# Patient Record
Sex: Male | Born: 1945 | Race: White | Hispanic: No | Marital: Married | Smoking: Never smoker
Health system: Southern US, Community
[De-identification: ages and names within clinical notes are randomized; demographics above are authoritative.]

## PROBLEM LIST (undated history)

## (undated) DIAGNOSIS — J45909 Unspecified asthma, uncomplicated: Secondary | ICD-10-CM

## (undated) DIAGNOSIS — I10 Essential (primary) hypertension: Secondary | ICD-10-CM

## (undated) DIAGNOSIS — G43909 Migraine, unspecified, not intractable, without status migrainosus: Secondary | ICD-10-CM

## (undated) DIAGNOSIS — R413 Other amnesia: Secondary | ICD-10-CM

## (undated) DIAGNOSIS — E785 Hyperlipidemia, unspecified: Secondary | ICD-10-CM

## (undated) DIAGNOSIS — G8929 Other chronic pain: Secondary | ICD-10-CM

## (undated) DIAGNOSIS — N183 Chronic kidney disease, stage 3 unspecified: Secondary | ICD-10-CM

## (undated) DIAGNOSIS — M545 Low back pain, unspecified: Secondary | ICD-10-CM

## (undated) DIAGNOSIS — H16139 Photokeratitis, unspecified eye: Secondary | ICD-10-CM

## (undated) DIAGNOSIS — F419 Anxiety disorder, unspecified: Secondary | ICD-10-CM

## (undated) DIAGNOSIS — F1010 Alcohol abuse, uncomplicated: Secondary | ICD-10-CM

## (undated) DIAGNOSIS — K219 Gastro-esophageal reflux disease without esophagitis: Secondary | ICD-10-CM

## (undated) DIAGNOSIS — T8859XA Other complications of anesthesia, initial encounter: Secondary | ICD-10-CM

## (undated) DIAGNOSIS — R339 Retention of urine, unspecified: Secondary | ICD-10-CM

## (undated) DIAGNOSIS — T4145XA Adverse effect of unspecified anesthetic, initial encounter: Secondary | ICD-10-CM

## (undated) DIAGNOSIS — Z889 Allergy status to unspecified drugs, medicaments and biological substances status: Secondary | ICD-10-CM

## (undated) DIAGNOSIS — R7303 Prediabetes: Secondary | ICD-10-CM

## (undated) DIAGNOSIS — F32A Depression, unspecified: Secondary | ICD-10-CM

## (undated) DIAGNOSIS — F329 Major depressive disorder, single episode, unspecified: Secondary | ICD-10-CM

## (undated) DIAGNOSIS — G4730 Sleep apnea, unspecified: Secondary | ICD-10-CM

## (undated) DIAGNOSIS — N200 Calculus of kidney: Secondary | ICD-10-CM

## (undated) DIAGNOSIS — M1990 Unspecified osteoarthritis, unspecified site: Secondary | ICD-10-CM

## (undated) HISTORY — DX: Migraine, unspecified, not intractable, without status migrainosus: G43.909

## (undated) HISTORY — DX: Chronic kidney disease, stage 3 unspecified: N18.30

## (undated) HISTORY — PX: VASECTOMY: SHX75

## (undated) HISTORY — PX: CARPAL TUNNEL RELEASE: SHX101

## (undated) HISTORY — PX: NASAL SINUS SURGERY: SHX719

## (undated) HISTORY — DX: Major depressive disorder, single episode, unspecified: F32.9

## (undated) HISTORY — DX: Photokeratitis, unspecified eye: H16.139

## (undated) HISTORY — DX: Other amnesia: R41.3

## (undated) HISTORY — DX: Gastro-esophageal reflux disease without esophagitis: K21.9

## (undated) HISTORY — DX: Anxiety disorder, unspecified: F41.9

## (undated) HISTORY — DX: Depression, unspecified: F32.A

## (undated) HISTORY — DX: Essential (primary) hypertension: I10

## (undated) HISTORY — PX: COLON SURGERY: SHX602

## (undated) HISTORY — DX: Hyperlipidemia, unspecified: E78.5

---

## 1981-04-03 HISTORY — PX: EXCISIONAL HEMORRHOIDECTOMY: SHX1541

## 2003-07-10 ENCOUNTER — Ambulatory Visit (HOSPITAL_BASED_OUTPATIENT_CLINIC_OR_DEPARTMENT_OTHER): Admission: RE | Admit: 2003-07-10 | Discharge: 2003-07-10 | Payer: Self-pay | Admitting: Family Medicine

## 2004-03-22 ENCOUNTER — Ambulatory Visit: Payer: Self-pay | Admitting: Family Medicine

## 2004-09-06 ENCOUNTER — Ambulatory Visit: Payer: Self-pay | Admitting: Family Medicine

## 2005-01-13 ENCOUNTER — Ambulatory Visit: Payer: Self-pay | Admitting: Family Medicine

## 2005-02-17 ENCOUNTER — Ambulatory Visit: Payer: Self-pay | Admitting: Family Medicine

## 2005-04-12 ENCOUNTER — Ambulatory Visit: Payer: Self-pay | Admitting: Family Medicine

## 2012-09-05 ENCOUNTER — Encounter (INDEPENDENT_AMBULATORY_CARE_PROVIDER_SITE_OTHER): Payer: Self-pay

## 2012-09-06 ENCOUNTER — Telehealth (INDEPENDENT_AMBULATORY_CARE_PROVIDER_SITE_OTHER): Payer: Self-pay | Admitting: General Surgery

## 2012-09-06 ENCOUNTER — Encounter (INDEPENDENT_AMBULATORY_CARE_PROVIDER_SITE_OTHER): Payer: Self-pay | Admitting: General Surgery

## 2012-09-06 ENCOUNTER — Ambulatory Visit (INDEPENDENT_AMBULATORY_CARE_PROVIDER_SITE_OTHER): Payer: Medicare PPO | Admitting: General Surgery

## 2012-09-06 VITALS — BP 132/90 | HR 78 | Temp 97.2°F | Resp 16 | Ht 68.0 in | Wt 198.4 lb

## 2012-09-06 DIAGNOSIS — D128 Benign neoplasm of rectum: Secondary | ICD-10-CM

## 2012-09-06 NOTE — Progress Notes (Signed)
Patient ID: Corey Adams., male   DOB: 03/15/1946, 67 y.o.   MRN: [id number]  [name]  September 20, 1945 [id number]  Patient Care Team: Leanor Rubenstein, MD as PCP - General (Family Medicine)  This patient is a 67 y.o.male who presents today for surgical evaluation at the request of Dr. Carolynne Edouard.   Reason for visit: Proximal 5cm large rectal polyp.  Consideration of TEM partial proctectomy vs. Low anterior resection  Pleasant active male.  Found to have large polyp at rectosigmoid junction by his gastroenterologist.  Area was tattooed.  Sent to [place] for evaluation.  Dr. Carolynne Edouard wondered if the patient will be a candidate for TEM so there is no proof of cancer.  Internal second opinion requested  There are no active problems to display for this patient.   Past Medical History  Diagnosis Date  . Hypertension   . Hyperlipidemia   . Allergy   . GERD (gastroesophageal reflux disease)   . Depression   . Anxiety   . Migraine   . Asthma   . Actinic keratitis   . Bilateral carpal tunnel syndrome     Past Surgical History  Procedure Laterality Date  . Excisional hemorrhoidectomy    . Bilateral carp tunnel release    . Nasal sinus surgery      History   Social History  . Marital Status: Married    Spouse Name: N/A    Number of Children: N/A  . Years of Education: N/A   Occupational History  . Not on file.   Social History Main Topics  . Smoking status: Never Smoker   . Smokeless tobacco: Never Used  . Alcohol Use: Yes  . Drug Use: No  . Sexually Active: Not on file   Other Topics Concern  . Not on file   Social History Narrative  . No narrative on file    Family History  Problem Relation Age of Onset  . Heart attack Mother   . Hypercholesterolemia Sister   . Heart disease Father   . Heart attack Father     Current Outpatient Prescriptions  Medication Sig Dispense Refill  . atorvastatin (LIPITOR) 10 MG tablet Take 10 mg by mouth daily.      .  bisoprolol-hydrochlorothiazide (ZIAC) 2.5-6.25 MG per tablet Take 1 tablet by mouth daily.      Marland Kitchen escitalopram (LEXAPRO) 10 MG tablet Take 10 mg by mouth daily.      Marland Kitchen levocetirizine (XYZAL) 5 MG tablet Take 5 mg by mouth every evening.      . mometasone (NASONEX) 50 MCG/ACT nasal spray Place 2 sprays into the nose daily.      Marland Kitchen omeprazole (PRILOSEC) 20 MG capsule Take 20 mg by mouth daily.      . polyethylene glycol-electrolytes (NULYTELY/GOLYTELY) 420 G solution        No current facility-administered medications for this visit.     Allergies  Allergen Reactions  . Codeine   . Shellfish Allergy     BP 132/90  Pulse 78  Temp(Src) 97.2 F (36.2 C) (Temporal)  Resp 16  Ht 5\' 8"  (1.727 m)  Wt 198 lb 6.4 oz (89.994 kg)  BMI 30.17 kg/m2  No results found.  ROS: Constitutional:  No fevers, chills, sweats.  Weight stable Eyes:  No vision changes, No discharge HENT:  No sore throats, nasal drainage Lymph: No neck swelling, No bruising easily Pulmonary:  No cough, productive sputum CV: No orthopnea, PND  Patient walks  30 minutes for about 1 miles without difficulty.  No exertional chest/neck/shoulder/arm pain. GI:  No personal nor family history of GI/colon cancer, inflammatory bowel disease, irritable bowel syndrome, allergy such as Celiac Sprue, dietary/dairy problems, colitis, ulcers nor gastritis.  No recent sick contacts/gastroenteritis.  No travel outside the country.  No changes in diet. Renal: No UTIs, No hematuria Genital:  No drainage, bleeding, masses Musculoskeletal: No severe joint pain.  Good ROM major joints Skin:  No sores or lesions Heme/Lymph:  No easy bleeding.  No swollen lymph nodes  PE: General: Pt awake/alert/oriented x4 in no major acute distress Eyes: PERRL, normal EOM. Sclera nonicteric Neuro: CN II-XII intact w/o focal sensory/motor deficits. Lymph: No head/neck/groin lymphadenopathy Psych:  No delerium/psychosis/paranoia HENT: Normocephalic, Mucus  membranes moist.  No thrush Neck: Supple, No tracheal deviation Chest: No pain.  Good respiratory excursion. CV:  Pulses intact.  Regular rhythm MS: Normal AROM mjr joints.  No obvious deformity Abdomen: Soft, Nondistended.  Nontender.  No incarcerated hernias. Rectal  Exam done with assistance of male Medical Assistant in the room.  Perianal skin clean with good hygiene.  No pruritis.  No external skin tags / hemorrhoids of significance.  No pilonidal disease.  No fissure.  No abscess/fistula.    Tolerates digital and anoscopic rectal exam.  Normal sphincter tone.  Posterior midline polyp in anal canal 2cm from verge - pedunculated.  No other rectal masses.  Hemorrhoidal piles WNL  Ext:  SCDs BLE.  No significant edema.  No cyanosis Skin: No petechiae / purpura  Procedure: Rigid proctoscopy.  After informed was confirmed, the patient was placed in left lateral decubitus positioning.  Was able to do gentle rectal examination digitally.  Cannot palpate a lesion.  Rigid proctoscope was advanced.  Gentle insufflation done.  I did have to remove the scope a few times to remove some stool since he had not been prepped for him but otherwise could see rather well.  Was able to advance her to proctoscope.  At 13 cm, I saw the most distal aspect of the pulp, posterior/left lateral.  There was tattooing distal to that.  I evacuated and he does have a posterior midline polyp within 1/2 cm from anal verge.  Seemed like a small anal canal polyp very pedunculated.  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  Plan: This point, I think it is too proximal to do by TEM.  If it is 5 cm and starts at 13 cm, them he states and it seemed centimeters up in there.  I am a little wary of offering that knowing that peritoneal breech is high.  I think given its proximal location & size, it would be best managed by low anterior resection.  They already have a relationship  with Dr. Carolynne Edouard.  He can handle this well.  They will discuss with him further.

## 2012-09-06 NOTE — Telephone Encounter (Signed)
LMOM for patient to call back and ask for me. To make him aware I have scheduled his MR for 09/10/2012 at 6:00 pm to arrive at 5:45 pm at the [facility] - [address] location. Patient called back and I made him aware of appt.

## 2012-09-06 NOTE — Progress Notes (Signed)
Subjective:     Patient ID: Corey Forester., male   DOB: 11-Apr-1945, 67 y.o.   MRN: [id number]  HPI We're asked to see the patient in consultation by Dr. Evette Cristal to evaluate him for a large polyp in his rectosigmoid area of his colon. The patient is a 67 year old white male who has been in his usual good state of health. He recently went for a routine screening colonoscopy at which time a 5 cm polyp was found at his rectosigmoid area. This was biopsied and came back as tubular adenoma with no dysplasia or malignancy. He denies any problems with constipation or diarrhea. He has not noticed any blood in his stool. His bowels move regularly. He has had no unintentional weight loss.  Review of Systems  Constitutional: Negative.   HENT: Negative.   Eyes: Negative.   Respiratory: Negative.   Cardiovascular: Negative.   Gastrointestinal: Negative.   Endocrine: Negative.   Genitourinary: Negative.   Musculoskeletal: Negative.   Skin: Negative.   Allergic/Immunologic: Negative.   Neurological: Negative.   Hematological: Negative.   Psychiatric/Behavioral: Negative.        Objective:   Physical Exam  Constitutional: He is oriented to person, place, and time. He appears well-developed and well-nourished.  HENT:  Head: Normocephalic and atraumatic.  Eyes: Conjunctivae and EOM are normal. Pupils are equal, round, and reactive to light.  Neck: Normal range of motion. Neck supple.  Cardiovascular: Normal rate, regular rhythm and normal heart sounds.   Pulmonary/Chest: Effort normal and breath sounds normal.  Abdominal: Soft. Bowel sounds are normal. He exhibits no mass. There is no tenderness.  Genitourinary: Rectum normal.  There is no palpable mass on digital exam  Musculoskeletal: Normal range of motion.  There is no palpable groin adenopathy or supraclavicular adenopathy  Neurological: He is alert and oriented to person, place, and time.  Skin: Skin is warm and dry.  Psychiatric: He has  a normal mood and affect. His behavior is normal.       Assessment:     The patient has a large tubular adenoma at his rectosigmoid. Because of the size of the adenoma and the possibility of it deteriorating into a cancer I think it would be to his benefit to have this removed. I had him also see Dr. Michaell Cowing today to see if he would be a candidate for a transanal approach but he is not. At this point I would recommend a laparoscopic assisted low anterior resection. I have discussed with him in detail the risks and benefits of the operation and dizziness as well as some of the technical aspects and he understands and wishes to proceed. Prior to scheduling surgery we will also obtain an MRI of his abdomen since he has a contrast allergy and we will also get a CEA level with his preoperative lab work.     Plan:     Plan for laparoscopic assisted low anterior resection for a large tubular adenoma at the rectosigmoid

## 2012-09-09 ENCOUNTER — Encounter (INDEPENDENT_AMBULATORY_CARE_PROVIDER_SITE_OTHER): Payer: Self-pay

## 2012-09-09 ENCOUNTER — Telehealth (INDEPENDENT_AMBULATORY_CARE_PROVIDER_SITE_OTHER): Payer: Self-pay | Admitting: *Deleted

## 2012-09-09 NOTE — Telephone Encounter (Signed)
Corey Adams with [facility] called to get clarification as to what the MRI is for and what they should be looking for.

## 2012-09-09 NOTE — Telephone Encounter (Signed)
Noreene Larsson updated at this time.

## 2012-09-09 NOTE — Telephone Encounter (Signed)
Evaluating abdominal mass. He has contrast allergy

## 2012-09-10 ENCOUNTER — Ambulatory Visit
Admission: RE | Admit: 2012-09-10 | Discharge: 2012-09-10 | Disposition: A | Discharge status: Home / Self Care | Payer: Medicare PPO | Source: Ambulatory Visit | Attending: General Surgery | Admitting: General Surgery

## 2012-09-10 DIAGNOSIS — D128 Benign neoplasm of rectum: Secondary | ICD-10-CM

## 2012-09-10 MED ORDER — GADOBENATE DIMEGLUMINE 529 MG/ML IV SOLN
20.0000 mL | Freq: Once | INTRAVENOUS | Status: AC | PRN
  Administered 2012-09-10: 20 mL via INTRAVENOUS

## 2012-09-16 NOTE — Telephone Encounter (Signed)
Notified patient that MRI did not show any additional disease.  Dr. Carolynne Edouard will proceed with orders and surgery scheduling should contact him early next week.

## 2012-09-23 ENCOUNTER — Encounter (INDEPENDENT_AMBULATORY_CARE_PROVIDER_SITE_OTHER): Payer: Self-pay | Admitting: General Surgery

## 2012-10-16 ENCOUNTER — Encounter (HOSPITAL_COMMUNITY): Payer: Self-pay | Admitting: Pharmacy Technician

## 2012-10-18 NOTE — Pre-Procedure Instructions (Addendum)
Warnell Forester.  10/18/2012   Your procedure is scheduled on:  Wednesday, July 30th.   Report to [facility] at 6:30AM. Check in at Admitting and wait in lobby,someone will come to get you   Call this number if you have problems the morning of surgery: [phone]   Remember:   Do not eat food or drink liquids after midnight.    Take these medicines the morning of surgery with A SIP OF WATER: bisoprolol-hydrochlorothiazide (ZIAC),escitalopram (LEXAPRO),omeprazole (PRILOSEC OTC).  May use mometasone (NASONEX).  Stop taking Aspirin, Coumadin, Plavix, Effient and Herbal medications.  Do not take any NSAIDs ie: Ibuprofen,  Advil,Naproxen or any medication containing Aspirin.   Do not wear jewelry,  Do not wear lotions, powders, or perfumes.   Do not shave 48 hours prior to surgery. Men may shave face and neck.  Do not bring valuables to the hospital.  [facility] is not responsible  for any belongings or valuables.  Contacts, dentures or bridgework may not be worn into surgery.  Leave suitcase in the car. After surgery it may be brought to your room.  For patients admitted to the hospital, checkout time is 11:00 AM the day of discharge.   Patients discharged the day of surgery will not be allowed to drive home.     Special Instructions: Shower using CHG 2 nights before surgery and the night before surgery.  If you shower the day of surgery use CHG.  Use special wash - you have one bottle of CHG for all showers.  You should use approximately 1/3 of the bottle for each shower.   Please read over the following fact sheets that you were given: Pain Booklet, Coughing and Deep Breathing and Surgical Site Infection Prevention

## 2012-10-21 ENCOUNTER — Encounter (HOSPITAL_COMMUNITY): Payer: Self-pay

## 2012-10-21 ENCOUNTER — Ambulatory Visit (HOSPITAL_COMMUNITY)
Admission: RE | Admit: 2012-10-21 | Discharge: 2012-10-21 | Disposition: A | Discharge status: Home / Self Care | Payer: Medicare PPO | Source: Ambulatory Visit | Attending: Anesthesiology | Admitting: Anesthesiology

## 2012-10-21 ENCOUNTER — Telehealth (INDEPENDENT_AMBULATORY_CARE_PROVIDER_SITE_OTHER): Payer: Self-pay

## 2012-10-21 ENCOUNTER — Encounter (HOSPITAL_COMMUNITY)
Admission: RE | Admit: 2012-10-21 | Discharge: 2012-10-21 | Disposition: A | Discharge status: Home / Self Care | Payer: Medicare PPO | Source: Ambulatory Visit | Attending: General Surgery | Admitting: General Surgery

## 2012-10-21 DIAGNOSIS — I10 Essential (primary) hypertension: Secondary | ICD-10-CM | POA: Insufficient documentation

## 2012-10-21 DIAGNOSIS — Z01810 Encounter for preprocedural cardiovascular examination: Secondary | ICD-10-CM | POA: Insufficient documentation

## 2012-10-21 DIAGNOSIS — J45909 Unspecified asthma, uncomplicated: Secondary | ICD-10-CM | POA: Insufficient documentation

## 2012-10-21 DIAGNOSIS — Z01812 Encounter for preprocedural laboratory examination: Secondary | ICD-10-CM | POA: Insufficient documentation

## 2012-10-21 DIAGNOSIS — K219 Gastro-esophageal reflux disease without esophagitis: Secondary | ICD-10-CM | POA: Insufficient documentation

## 2012-10-21 HISTORY — DX: Adverse effect of unspecified anesthetic, initial encounter: T41.45XA

## 2012-10-21 HISTORY — DX: Unspecified osteoarthritis, unspecified site: M19.90

## 2012-10-21 HISTORY — DX: Sleep apnea, unspecified: G47.30

## 2012-10-21 HISTORY — DX: Other complications of anesthesia, initial encounter: T88.59XA

## 2012-10-21 LAB — CBC
HCT: 44 % (ref 39.0–52.0)
MCV: 91.9 fL (ref 78.0–100.0)
Platelets: 179 10*3/uL (ref 150–400)
RBC: 4.79 MIL/uL (ref 4.22–5.81)
WBC: 5.7 10*3/uL (ref 4.0–10.5)

## 2012-10-21 LAB — BASIC METABOLIC PANEL
CO2: 29 mEq/L (ref 19–32)
Calcium: 9.8 mg/dL (ref 8.4–10.5)
Chloride: 104 mEq/L (ref 96–112)
Sodium: 142 mEq/L (ref 135–145)

## 2012-10-21 MED ORDER — CHLORHEXIDINE GLUCONATE 4 % EX LIQD: 1.0000 "application " | Freq: Once | CUTANEOUS | Status: DC

## 2012-10-21 NOTE — Telephone Encounter (Signed)
Message copied by Brennan Bailey on Mon Oct 21, 2012 11:55 AM ------      Message from: Marin Shutter      Created: Mon Oct 21, 2012 11:35 AM      Regarding: Dr. Alvester Morin: [phone]       Pt called, getting sx next week, would like to know if he needs to take anything to 'flush his colon'. Thx ------

## 2012-10-21 NOTE — Telephone Encounter (Signed)
Called patient and let him know 1 day prep is being mailed to him. He is not being prescribed abx,

## 2012-10-29 MED ORDER — DEXTROSE 5 % IV SOLN
2.0000 g | INTRAVENOUS | Status: AC
  Administered 2012-10-30: 2 g via INTRAVENOUS
  Filled 2012-10-29: qty 2

## 2012-10-30 ENCOUNTER — Inpatient Hospital Stay (HOSPITAL_COMMUNITY)
Admission: RE | Admit: 2012-10-30 | Discharge: 2012-11-03 | DRG: 334 | Disposition: A | Discharge status: Home / Self Care | Payer: Medicare PPO | Source: Ambulatory Visit | Attending: General Surgery | Admitting: General Surgery

## 2012-10-30 ENCOUNTER — Encounter (HOSPITAL_COMMUNITY): Payer: Self-pay | Admitting: Anesthesiology

## 2012-10-30 ENCOUNTER — Encounter (HOSPITAL_COMMUNITY)
Admission: RE | Disposition: A | Discharge status: Home / Self Care | Payer: Self-pay | Source: Ambulatory Visit | Attending: General Surgery

## 2012-10-30 ENCOUNTER — Encounter (HOSPITAL_COMMUNITY): Payer: Self-pay

## 2012-10-30 ENCOUNTER — Inpatient Hospital Stay (HOSPITAL_COMMUNITY): Payer: Medicare PPO | Admitting: Anesthesiology

## 2012-10-30 DIAGNOSIS — D126 Benign neoplasm of colon, unspecified: Secondary | ICD-10-CM

## 2012-10-30 DIAGNOSIS — F411 Generalized anxiety disorder: Secondary | ICD-10-CM | POA: Diagnosis present

## 2012-10-30 DIAGNOSIS — F3289 Other specified depressive episodes: Secondary | ICD-10-CM | POA: Diagnosis present

## 2012-10-30 DIAGNOSIS — E785 Hyperlipidemia, unspecified: Secondary | ICD-10-CM | POA: Diagnosis present

## 2012-10-30 DIAGNOSIS — K620 Anal polyp: Secondary | ICD-10-CM | POA: Diagnosis present

## 2012-10-30 DIAGNOSIS — D128 Benign neoplasm of rectum: Principal | ICD-10-CM | POA: Diagnosis present

## 2012-10-30 DIAGNOSIS — I10 Essential (primary) hypertension: Secondary | ICD-10-CM | POA: Diagnosis present

## 2012-10-30 DIAGNOSIS — K219 Gastro-esophageal reflux disease without esophagitis: Secondary | ICD-10-CM | POA: Diagnosis present

## 2012-10-30 DIAGNOSIS — F329 Major depressive disorder, single episode, unspecified: Secondary | ICD-10-CM | POA: Diagnosis present

## 2012-10-30 DIAGNOSIS — J45909 Unspecified asthma, uncomplicated: Secondary | ICD-10-CM | POA: Diagnosis present

## 2012-10-30 HISTORY — PX: LAPAROSCOPIC LOW ANTERIOR RESECTION: SHX5904

## 2012-10-30 LAB — CBC
HCT: 39.5 % (ref 39.0–52.0)
MCHC: 35.7 g/dL (ref 30.0–36.0)
Platelets: 162 10*3/uL (ref 150–400)
RDW: 12.3 % (ref 11.5–15.5)

## 2012-10-30 LAB — TYPE AND SCREEN: ABO/RH(D): O POS

## 2012-10-30 LAB — CREATININE, SERUM: GFR calc non Af Amer: 75 mL/min — ABNORMAL LOW (ref >90)

## 2012-10-30 SURGERY — RESECTION, RECTUM, LOW ANTERIOR, LAPAROSCOPIC
Anesthesia: General | Site: Abdomen | Wound class: Clean Contaminated

## 2012-10-30 MED ORDER — BIOTENE DRY MOUTH MT LIQD
15.0000 mL | Freq: Two times a day (BID) | OROMUCOSAL | Status: DC
  Administered 2012-10-30 – 2012-11-02 (×5): 15 mL via OROMUCOSAL

## 2012-10-30 MED ORDER — POVIDONE-IODINE 10 % EX OINT
TOPICAL_OINTMENT | CUTANEOUS | Status: AC
  Filled 2012-10-30: qty 28.35

## 2012-10-30 MED ORDER — CHLORHEXIDINE GLUCONATE 4 % EX LIQD: 1.0000 "application " | Freq: Once | CUTANEOUS | Status: DC

## 2012-10-30 MED ORDER — MORPHINE SULFATE (PF) 1 MG/ML IV SOLN
INTRAVENOUS | Status: AC
  Filled 2012-10-30: qty 25

## 2012-10-30 MED ORDER — GLYCOPYRROLATE 0.2 MG/ML IJ SOLN
INTRAMUSCULAR | Status: DC | PRN
  Administered 2012-10-30: 0.6 mg via INTRAVENOUS

## 2012-10-30 MED ORDER — SODIUM CHLORIDE 0.9 % IR SOLN
Status: DC | PRN
  Administered 2012-10-30 (×2): 1000 mL

## 2012-10-30 MED ORDER — DIPHENHYDRAMINE HCL 12.5 MG/5ML PO ELIX
12.5000 mg | ORAL_SOLUTION | Freq: Four times a day (QID) | ORAL | Status: DC | PRN
  Filled 2012-10-30: qty 5

## 2012-10-30 MED ORDER — MORPHINE SULFATE (PF) 1 MG/ML IV SOLN
INTRAVENOUS | Status: DC
  Administered 2012-10-30: 13:00:00 via INTRAVENOUS
  Administered 2012-10-30: 18 mg via INTRAVENOUS
  Administered 2012-10-30: 4 mg via INTRAVENOUS
  Filled 2012-10-30: qty 25

## 2012-10-30 MED ORDER — CHLORHEXIDINE GLUCONATE 0.12 % MT SOLN
15.0000 mL | Freq: Two times a day (BID) | OROMUCOSAL | Status: DC
  Administered 2012-10-31 – 2012-11-02 (×4): 15 mL via OROMUCOSAL
  Filled 2012-10-30 (×5): qty 15

## 2012-10-30 MED ORDER — ONDANSETRON HCL 4 MG/2ML IJ SOLN
4.0000 mg | Freq: Four times a day (QID) | INTRAMUSCULAR | Status: DC | PRN
  Administered 2012-10-31 (×2): 4 mg via INTRAVENOUS
  Filled 2012-10-30 (×2): qty 2

## 2012-10-30 MED ORDER — ALVIMOPAN 12 MG PO CAPS
12.0000 mg | ORAL_CAPSULE | Freq: Once | ORAL | Status: AC
  Administered 2012-10-30: 12 mg via ORAL
  Filled 2012-10-30: qty 1

## 2012-10-30 MED ORDER — SODIUM CHLORIDE 0.9 % IJ SOLN: 9.0000 mL | INTRAMUSCULAR | Status: DC | PRN

## 2012-10-30 MED ORDER — ONDANSETRON HCL 4 MG/2ML IJ SOLN
INTRAMUSCULAR | Status: DC | PRN
  Administered 2012-10-30: 4 mg via INTRAVENOUS

## 2012-10-30 MED ORDER — BUPIVACAINE-EPINEPHRINE 0.25% -1:200000 IJ SOLN
INTRAMUSCULAR | Status: DC | PRN
  Administered 2012-10-30: 30 mL

## 2012-10-30 MED ORDER — ALVIMOPAN 12 MG PO CAPS
12.0000 mg | ORAL_CAPSULE | Freq: Two times a day (BID) | ORAL | Status: DC
  Administered 2012-10-31 – 2012-11-01 (×4): 12 mg via ORAL
  Filled 2012-10-30 (×7): qty 1

## 2012-10-30 MED ORDER — EPHEDRINE SULFATE 50 MG/ML IJ SOLN
INTRAMUSCULAR | Status: DC | PRN
  Administered 2012-10-30 (×3): 10 mg via INTRAVENOUS

## 2012-10-30 MED ORDER — ALBUMIN HUMAN 5 % IV SOLN
INTRAVENOUS | Status: DC | PRN
  Administered 2012-10-30: 10:00:00 via INTRAVENOUS

## 2012-10-30 MED ORDER — LACTATED RINGERS IV SOLN
INTRAVENOUS | Status: DC | PRN
  Administered 2012-10-30 (×4): via INTRAVENOUS

## 2012-10-30 MED ORDER — NALOXONE HCL 0.4 MG/ML IJ SOLN
0.4000 mg | INTRAMUSCULAR | Status: DC | PRN
  Filled 2012-10-30: qty 1

## 2012-10-30 MED ORDER — KCL IN DEXTROSE-NACL 20-5-0.9 MEQ/L-%-% IV SOLN
INTRAVENOUS | Status: DC
  Administered 2012-10-30 – 2012-11-02 (×5): via INTRAVENOUS
  Filled 2012-10-30 (×9): qty 1000

## 2012-10-30 MED ORDER — ONDANSETRON HCL 4 MG PO TABS: 4.0000 mg | ORAL_TABLET | Freq: Four times a day (QID) | ORAL | Status: DC | PRN

## 2012-10-30 MED ORDER — LIDOCAINE HCL 1 % IJ SOLN: INTRAMUSCULAR | Status: DC | PRN

## 2012-10-30 MED ORDER — FENTANYL CITRATE 0.05 MG/ML IJ SOLN
INTRAMUSCULAR | Status: DC | PRN
  Administered 2012-10-30: 50 ug via INTRAVENOUS
  Administered 2012-10-30: 100 ug via INTRAVENOUS
  Administered 2012-10-30 (×2): 50 ug via INTRAVENOUS
  Administered 2012-10-30: 150 ug via INTRAVENOUS

## 2012-10-30 MED ORDER — ROCURONIUM BROMIDE 100 MG/10ML IV SOLN
INTRAVENOUS | Status: DC | PRN
  Administered 2012-10-30: 50 mg via INTRAVENOUS

## 2012-10-30 MED ORDER — LIDOCAINE HCL 4 % MT SOLN
OROMUCOSAL | Status: DC | PRN
  Administered 2012-10-30: 4 mL via TOPICAL

## 2012-10-30 MED ORDER — DIPHENHYDRAMINE HCL 50 MG/ML IJ SOLN
12.5000 mg | Freq: Four times a day (QID) | INTRAMUSCULAR | Status: DC | PRN
  Filled 2012-10-30: qty 0.25

## 2012-10-30 MED ORDER — HEPARIN SODIUM (PORCINE) 5000 UNIT/ML IJ SOLN
5000.0000 [IU] | Freq: Three times a day (TID) | INTRAMUSCULAR | Status: DC
  Administered 2012-10-31 – 2012-11-03 (×10): 5000 [IU] via SUBCUTANEOUS
  Filled 2012-10-30 (×16): qty 1

## 2012-10-30 MED ORDER — PANTOPRAZOLE SODIUM 40 MG IV SOLR
40.0000 mg | INTRAVENOUS | Status: DC
  Administered 2012-10-30 – 2012-11-01 (×3): 40 mg via INTRAVENOUS
  Filled 2012-10-30 (×4): qty 40

## 2012-10-30 MED ORDER — FLUTICASONE PROPIONATE 50 MCG/ACT NA SUSP
1.0000 | Freq: Every day | NASAL | Status: DC
  Administered 2012-10-30 – 2012-11-02 (×4): 1 via NASAL
  Filled 2012-10-30: qty 16

## 2012-10-30 MED ORDER — PHENYLEPHRINE HCL 10 MG/ML IJ SOLN
INTRAMUSCULAR | Status: DC | PRN
  Administered 2012-10-30 (×2): 80 ug via INTRAVENOUS
  Administered 2012-10-30: 40 ug via INTRAVENOUS

## 2012-10-30 MED ORDER — PNEUMOCOCCAL VAC POLYVALENT 25 MCG/0.5ML IJ INJ
0.5000 mL | INJECTION | INTRAMUSCULAR | Status: AC
  Administered 2012-10-31: 0.5 mL via INTRAMUSCULAR
  Filled 2012-10-30: qty 0.5

## 2012-10-30 MED ORDER — HYDROMORPHONE HCL PF 1 MG/ML IJ SOLN: 0.2500 mg | INTRAMUSCULAR | Status: DC | PRN

## 2012-10-30 MED ORDER — MORPHINE SULFATE 4 MG/ML IJ SOLN
4.0000 mg | INTRAMUSCULAR | Status: DC | PRN
  Administered 2012-10-31 (×3): 4 mg via INTRAVENOUS
  Filled 2012-10-30 (×3): qty 1

## 2012-10-30 MED ORDER — VECURONIUM BROMIDE 10 MG IV SOLR
INTRAVENOUS | Status: DC | PRN
  Administered 2012-10-30 (×2): 3 mg via INTRAVENOUS
  Administered 2012-10-30: 1 mg via INTRAVENOUS

## 2012-10-30 MED ORDER — ONDANSETRON HCL 4 MG/2ML IJ SOLN: 4.0000 mg | Freq: Four times a day (QID) | INTRAMUSCULAR | Status: DC | PRN

## 2012-10-30 MED ORDER — PROPOFOL 10 MG/ML IV BOLUS
INTRAVENOUS | Status: DC | PRN
  Administered 2012-10-30: 20 mg via INTRAVENOUS
  Administered 2012-10-30: 130 mg via INTRAVENOUS

## 2012-10-30 MED ORDER — WHITE PETROLATUM GEL
Status: AC
  Administered 2012-10-30: 16:00:00
  Filled 2012-10-30: qty 5

## 2012-10-30 MED ORDER — OXYCODONE HCL 5 MG PO TABS
5.0000 mg | ORAL_TABLET | Freq: Once | ORAL | Status: DC | PRN
Start: 2012-10-30 — End: 2012-10-30

## 2012-10-30 MED ORDER — OXYCODONE HCL 5 MG/5ML PO SOLN: 5.0000 mg | Freq: Once | ORAL | Status: DC | PRN

## 2012-10-30 MED ORDER — NEOSTIGMINE METHYLSULFATE 1 MG/ML IJ SOLN
INTRAMUSCULAR | Status: DC | PRN
  Administered 2012-10-30: 5 mg via INTRAVENOUS

## 2012-10-30 MED ORDER — LIDOCAINE HCL (CARDIAC) 20 MG/ML IV SOLN
INTRAVENOUS | Status: DC | PRN
  Administered 2012-10-30: 40 mg via INTRAVENOUS

## 2012-10-30 MED ORDER — MIDAZOLAM HCL 5 MG/5ML IJ SOLN
INTRAMUSCULAR | Status: DC | PRN
  Administered 2012-10-30: 2 mg via INTRAVENOUS

## 2012-10-30 MED ORDER — BUPIVACAINE-EPINEPHRINE PF 0.25-1:200000 % IJ SOLN
INTRAMUSCULAR | Status: AC
  Filled 2012-10-30: qty 30

## 2012-10-30 SURGICAL SUPPLY — 78 items
APPLIER CLIP 5 13 M/L LIGAMAX5 (MISCELLANEOUS)
APPLIER CLIP ROT 10 11.4 M/L (STAPLE)
APR CLP MED LRG 11.4X10 (STAPLE)
BLADE SURG 10 STRL SS (BLADE) ×2 IMPLANT
BLADE SURG ROTATE 9660 (MISCELLANEOUS) IMPLANT
CANISTER SUCTION 2500CC (MISCELLANEOUS) ×2 IMPLANT
CELLS DAT CNTRL 66122 CELL SVR (MISCELLANEOUS) IMPLANT
CHLORAPREP W/TINT 26ML (MISCELLANEOUS) ×2 IMPLANT
CLIP APPLIE 5 13 M/L LIGAMAX5 (MISCELLANEOUS) IMPLANT
CLIP APPLIE ROT 10 11.4 M/L (STAPLE) IMPLANT
CLOTH BEACON ORANGE TIMEOUT ST (SAFETY) ×2 IMPLANT
COVER MAYO STAND STRL (DRAPES) ×2 IMPLANT
COVER SURGICAL LIGHT HANDLE (MISCELLANEOUS) ×2 IMPLANT
DISSECTOR BLUNT TIP ENDO 5MM (MISCELLANEOUS) IMPLANT
DRAPE PROXIMA HALF (DRAPES) ×2 IMPLANT
DRAPE UTILITY 15X26 W/TAPE STR (DRAPE) ×6 IMPLANT
DRAPE WARM FLUID 44X44 (DRAPE) ×2 IMPLANT
DRSG OPSITE POSTOP 4X8 (GAUZE/BANDAGES/DRESSINGS) ×2 IMPLANT
ELECT CAUTERY BLADE 6.4 (BLADE) ×4 IMPLANT
ELECT REM PT RETURN 9FT ADLT (ELECTROSURGICAL) ×2
ELECTRODE REM PT RTRN 9FT ADLT (ELECTROSURGICAL) ×1 IMPLANT
GAUZE SPONGE 2X2 8PLY STRL LF (GAUZE/BANDAGES/DRESSINGS) ×1 IMPLANT
GEL ULTRASOUND 20GR AQUASONIC (MISCELLANEOUS) IMPLANT
GLOVE BIO SURGEON STRL SZ7 (GLOVE) ×4 IMPLANT
GLOVE BIO SURGEON STRL SZ7.5 (GLOVE) ×8 IMPLANT
GLOVE BIOGEL PI IND STRL 7.5 (GLOVE) ×5 IMPLANT
GLOVE BIOGEL PI IND STRL 8 (GLOVE) ×2 IMPLANT
GLOVE BIOGEL PI INDICATOR 7.5 (GLOVE) ×5
GLOVE BIOGEL PI INDICATOR 8 (GLOVE) ×2
GLOVE ECLIPSE 7.5 STRL STRAW (GLOVE) ×4 IMPLANT
GLOVE SS BIOGEL STRL SZ 7.5 (GLOVE) ×2 IMPLANT
GLOVE SUPERSENSE BIOGEL SZ 7.5 (GLOVE) ×2
GOWN STRL NON-REIN LRG LVL3 (GOWN DISPOSABLE) ×10 IMPLANT
KIT BASIN OR (CUSTOM PROCEDURE TRAY) ×2 IMPLANT
KIT ROOM TURNOVER OR (KITS) ×2 IMPLANT
LEGGING LITHOTOMY PAIR STRL (DRAPES) ×2 IMPLANT
LIGASURE IMPACT 36 18CM CVD LR (INSTRUMENTS) ×2 IMPLANT
NS IRRIG 1000ML POUR BTL (IV SOLUTION) ×4 IMPLANT
PAD ARMBOARD 7.5X6 YLW CONV (MISCELLANEOUS) ×4 IMPLANT
PAD SHARPS MAGNETIC DISPOSAL (MISCELLANEOUS) ×2 IMPLANT
PENCIL BUTTON HOLSTER BLD 10FT (ELECTRODE) ×2 IMPLANT
RTRCTR WOUND ALEXIS 18CM MED (MISCELLANEOUS)
SCALPEL HARMONIC ACE (MISCELLANEOUS) ×2 IMPLANT
SCISSORS LAP 5X35 DISP (ENDOMECHANICALS) IMPLANT
SEALER TISSUE G2 STRG ARTC 35C (ENDOMECHANICALS) IMPLANT
SET IRRIG TUBING LAPAROSCOPIC (IRRIGATION / IRRIGATOR) ×2 IMPLANT
SLEEVE ENDOPATH XCEL 5M (ENDOMECHANICALS) ×8 IMPLANT
SPECIMEN JAR LARGE (MISCELLANEOUS) ×2 IMPLANT
SPONGE GAUZE 2X2 STER 10/PKG (GAUZE/BANDAGES/DRESSINGS) ×1
SPONGE LAP 18X18 X RAY DECT (DISPOSABLE) IMPLANT
STAPLER CUT CVD 40MM BLUE (STAPLE) ×2 IMPLANT
STAPLER PROXIMATE 75MM BLUE (STAPLE) ×2 IMPLANT
STAPLER VISISTAT 35W (STAPLE) ×2 IMPLANT
SUCTION POOLE TIP (SUCTIONS) ×2 IMPLANT
SURGILUBE 2OZ TUBE FLIPTOP (MISCELLANEOUS) ×2 IMPLANT
SUT PDS AB 1 CT  36 (SUTURE)
SUT PDS AB 1 CT 36 (SUTURE) IMPLANT
SUT PDS AB 1 TP1 96 (SUTURE) ×4 IMPLANT
SUT PROLENE 2 0 CT2 30 (SUTURE) ×2 IMPLANT
SUT PROLENE 2 0 KS (SUTURE) IMPLANT
SUT VIC AB 2-0 SH 18 (SUTURE) ×2 IMPLANT
SUT VIC AB 3-0 SH 18 (SUTURE) ×2 IMPLANT
SUT VICRYL AB 2 0 TIES (SUTURE) ×2 IMPLANT
SUT VICRYL AB 3 0 TIES (SUTURE) ×2 IMPLANT
SYS LAPSCP GELPORT 120MM (MISCELLANEOUS)
SYSTEM LAPSCP GELPORT 120MM (MISCELLANEOUS) IMPLANT
TAPE CLOTH SURG 6X10 WHT LF (GAUZE/BANDAGES/DRESSINGS) ×2 IMPLANT
TOWEL OR 17X26 10 PK STRL BLUE (TOWEL DISPOSABLE) ×4 IMPLANT
TRAY FOLEY CATH 14FRSI W/METER (CATHETERS) ×2 IMPLANT
TRAY LAPAROSCOPIC (CUSTOM PROCEDURE TRAY) ×2 IMPLANT
TRAY PROCTOSCOPIC FIBER OPTIC (SET/KITS/TRAYS/PACK) ×2 IMPLANT
TROCAR XCEL 12X100 BLDLESS (ENDOMECHANICALS) IMPLANT
TROCAR XCEL BLUNT TIP 100MML (ENDOMECHANICALS) IMPLANT
TROCAR XCEL NON-BLD 11X100MML (ENDOMECHANICALS) IMPLANT
TROCAR XCEL NON-BLD 5MMX100MML (ENDOMECHANICALS) ×2 IMPLANT
TUBE CONNECTING 12X1/4 (SUCTIONS) ×2 IMPLANT
TUBING FILTER THERMOFLATOR (ELECTROSURGICAL) ×2 IMPLANT
YANKAUER SUCT BULB TIP NO VENT (SUCTIONS) ×4 IMPLANT

## 2012-10-30 NOTE — Anesthesia Postprocedure Evaluation (Signed)
  Anesthesia Post-op Note  Patient: Corey Adams.  Procedure(s) Performed: Procedure(s): LAPAROSCOPIC LOW ANTERIOR RESECTION with Rigid Proctoscopy (N/A)  Patient Location: PACU  Anesthesia Type:General  Level of Consciousness: awake and alert   Airway and Oxygen Therapy: Patient Spontanous Breathing and Patient connected to nasal cannula oxygen  Post-op Pain: none  Post-op Assessment: Post-op Vital signs reviewed, Patient's Cardiovascular Status Stable, Respiratory Function Stable, Patent Airway and No signs of Nausea or vomiting  Post-op Vital Signs: Reviewed and stable  Complications: No apparent anesthesia complications

## 2012-10-30 NOTE — Interval H&P Note (Signed)
History and Physical Interval Note:  10/30/2012 7:40 AM  Corey Adams.  has presented today for surgery, with the diagnosis of rectal mass  The various methods of treatment have been discussed with the patient and family. After consideration of risks, benefits and other options for treatment, the patient has consented to  Procedure(s): LAPAROSCOPIC LOW ANTERIOR RESECTION (N/A) as a surgical intervention .  The patient's history has been reviewed, patient examined, no change in status, stable for surgery.  I have reviewed the patient's chart and labs.  Questions were answered to the patient's satisfaction.     TOTH III,Gamal Todisco S

## 2012-10-30 NOTE — H&P (Signed)
Corey Adams.  09/06/2012 10:10 AM   Office Visit  MRN:  [id number]   Description: 67 year old male  Provider: Robyne Askew, MD  Department: [facility]        Diagnoses    Adenomatous rectal polyp    -  Primary    211.4         Current Vitals - Last Recorded    BP Pulse Temp(Src) Resp Ht Wt    132/90 78 97.2 F (36.2 C) (Temporal) 16 5\' 8"  (1.727 m) 198 lb 6.4 oz (89.994 kg)       BMI              30.17 kg/m2                 Progress Notes    Corey Sportsman, MD at 09/06/2012  2:13 PM    Status: Signed                   Patient ID: Corey Adams., male   DOB: 18-Feb-1946, 67 y.o.   MRN: [id number]   Corey Adams   1945-07-26 [id number]   Patient Care Team: Leanor Rubenstein, MD as PCP - General (Family Medicine)   This patient is a 67 y.o.male who presents today for surgical evaluation at the request of Dr. Carolynne Edouard.    Reason for visit: Proximal 5cm large rectal polyp.  Consideration of TEM partial proctectomy vs. Low anterior resection   Pleasant active male.  Found to have large polyp at rectosigmoid junction by his gastroenterologist.  Area was tattooed.  Sent to [place] for evaluation.  Dr. Carolynne Edouard wondered if the patient will be a candidate for TEM so there is no proof of cancer.  Internal second opinion requested   There are no active problems to display for this patient.      Past Medical History   Diagnosis  Date   .  Hypertension     .  Hyperlipidemia     .  Allergy     .  GERD (gastroesophageal reflux disease)     .  Depression     .  Anxiety     .  Migraine     .  Asthma     .  Actinic keratitis     .  Bilateral carpal tunnel syndrome           Past Surgical History   Procedure  Laterality  Date   .  Excisional hemorrhoidectomy       .  Bilateral carp tunnel release       .  Nasal sinus surgery             History       Social History   .  Marital Status:  Married       Spouse Name:  N/A       Number of  Children:  N/A   .  Years of Education:  N/A       Occupational History   .  Not on file.       Social History Main Topics   .  Smoking status:  Never Smoker    .  Smokeless tobacco:  Never Used   .  Alcohol Use:  Yes   .  Drug Use:  No   .  Sexually Active:  Not on file       Other Topics  Concern   .  Not on file       Social History Narrative   .  No narrative on file         Family History   Problem  Relation  Age of Onset   .  Heart attack  Mother     .  Hypercholesterolemia  Sister     .  Heart disease  Father     .  Heart attack  Father           Current Outpatient Prescriptions   Medication  Sig  Dispense  Refill   .  atorvastatin (LIPITOR) 10 MG tablet  Take 10 mg by mouth daily.         .  bisoprolol-hydrochlorothiazide (ZIAC) 2.5-6.25 MG per tablet  Take 1 tablet by mouth daily.         Marland Kitchen  escitalopram (LEXAPRO) 10 MG tablet  Take 10 mg by mouth daily.         Marland Kitchen  levocetirizine (XYZAL) 5 MG tablet  Take 5 mg by mouth every evening.         .  mometasone (NASONEX) 50 MCG/ACT nasal spray  Place 2 sprays into the nose daily.         Marland Kitchen  omeprazole (PRILOSEC) 20 MG capsule  Take 20 mg by mouth daily.         .  polyethylene glycol-electrolytes (NULYTELY/GOLYTELY) 420 G solution               No current facility-administered medications for this visit.         Allergies   Allergen  Reactions   .  Codeine     .  Shellfish Allergy          BP 132/90  Pulse 78  Temp(Src) 97.2 F (36.2 C) (Temporal)  Resp 16  Ht 5\' 8"  (1.727 m)  Wt 198 lb 6.4 oz (89.994 kg)  BMI 30.17 kg/m2   No results found.   ROS: Constitutional:  No fevers, chills, sweats.  Weight stable Eyes:  No vision changes, No discharge HENT:  No sore throats, nasal drainage Lymph: No neck swelling, No bruising easily Pulmonary:  No cough, productive sputum CV: No orthopnea, PND  Patient walks 30 minutes for about 1 miles without difficulty.  No exertional  chest/neck/shoulder/arm pain. GI:  No personal nor family history of GI/colon cancer, inflammatory bowel disease, irritable bowel syndrome, allergy such as Celiac Sprue, dietary/dairy problems, colitis, ulcers nor gastritis.  No recent sick contacts/gastroenteritis.  No travel outside the country.  No changes in diet. Renal: No UTIs, No hematuria Genital:  No drainage, bleeding, masses Musculoskeletal: No severe joint pain.  Good ROM major joints Skin:  No sores or lesions Heme/Lymph:  No easy bleeding.  No swollen lymph nodes   PE: General: Pt awake/alert/oriented x4 in no major acute distress Eyes: PERRL, normal EOM. Sclera nonicteric Neuro: CN II-XII intact w/o focal sensory/motor deficits. Lymph: No head/neck/groin lymphadenopathy Psych:  No delerium/psychosis/paranoia HENT: Normocephalic, Mucus membranes moist.  No thrush Neck: Supple, No tracheal deviation Chest: No pain.  Good respiratory excursion. CV:  Pulses intact.  Regular rhythm MS: Normal AROM mjr joints.  No obvious deformity Abdomen: Soft, Nondistended.  Nontender.  No incarcerated hernias. Rectal  Exam done with assistance of male Medical Assistant in the room.   Perianal skin clean with good hygiene.  No pruritis.  No external skin tags / hemorrhoids of significance.  No pilonidal disease.  No fissure.  No abscess/fistula.     Tolerates digital and anoscopic rectal exam.  Normal sphincter tone.  Posterior midline polyp in anal canal 2cm from verge - pedunculated.  No other rectal masses.  Hemorrhoidal piles WNL   Ext:  SCDs BLE.  No significant edema.  No cyanosis Skin: No petechiae / purpura   Procedure: Rigid proctoscopy.   After informed was confirmed, the patient was placed in left lateral decubitus positioning. Was able to do gentle rectal examination digitally.  Cannot palpate a lesion.  Rigid proctoscope was advanced.  Gentle insufflation done.  I did have to remove the scope a few times to remove some  stool since he had not been prepped for him but otherwise could see rather well.  Was able to advance her to proctoscope.  At 13 cm, I saw the most distal aspect of the pulp, posterior/left lateral.  There was tattooing distal to that.  I evacuated and he does have a posterior midline polyp within 1/2 cm from anal verge.  Seemed like a small anal canal polyp very pedunculated.  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   Plan: This point, I think it is too proximal to do by TEM.  If it is 5 cm and starts at 13 cm, them he states and it seemed centimeters up in there.  I am a little wary of offering that knowing that peritoneal breech is high.  I think given its proximal location & size, it would be best managed by low anterior resection.  They already have a relationship with Dr. Carolynne Edouard.  He can handle this well.  They will discuss with him further.         Robyne Askew, MD at 09/06/2012  2:13 PM    Status: Signed                   Subjective:       Patient ID: Corey Adams., male   DOB: 27-Dec-1945, 67 y.o.   MRN: [id number]   HPI We're asked to see the patient in consultation by Dr. Evette Cristal to evaluate him for a large polyp in his rectosigmoid area of his colon. The patient is a 67 year old white male who has been in his usual good state of health. He recently went for a routine screening colonoscopy at which time a 5 cm polyp was found at his rectosigmoid area. This was biopsied and came back as tubular adenoma with no dysplasia or malignancy. He denies any problems with constipation or diarrhea. He has not noticed any blood in his stool. His bowels move regularly. He has had no unintentional weight loss.   Review of Systems  Constitutional: Negative.   HENT: Negative.   Eyes: Negative.   Respiratory: Negative.   Cardiovascular: Negative.   Gastrointestinal: Negative.   Endocrine: Negative.   Genitourinary: Negative.    Musculoskeletal: Negative.   Skin: Negative.   Allergic/Immunologic: Negative.   Neurological: Negative.   Hematological: Negative.   Psychiatric/Behavioral: Negative.           Objective:     Physical Exam  Constitutional: He is oriented to person, place, and time. He appears well-developed and well-nourished.  HENT:   Head: Normocephalic and atraumatic.  Eyes: Conjunctivae and EOM are normal. Pupils are equal, round, and reactive to light.  Neck: Normal range of motion. Neck supple.  Cardiovascular: Normal rate, regular rhythm and normal heart sounds.  Pulmonary/Chest: Effort normal and breath sounds normal.  Abdominal: Soft. Bowel sounds are normal. He exhibits no mass. There is no tenderness.  Genitourinary: Rectum normal.  There is no palpable mass on digital exam  Musculoskeletal: Normal range of motion.  There is no palpable groin adenopathy or supraclavicular adenopathy  Neurological: He is alert and oriented to person, place, and time.  Skin: Skin is warm and dry.  Psychiatric: He has a normal mood and affect. His behavior is normal.          Assessment:       The patient has a large tubular adenoma at his rectosigmoid. Because of the size of the adenoma and the possibility of it deteriorating into a cancer I think it would be to his benefit to have this removed. I had him also see Dr. Michaell Cowing today to see if he would be a candidate for a transanal approach but he is not. At this point I would recommend a laparoscopic assisted low anterior resection. I have discussed with him in detail the risks and benefits of the operation and dizziness as well as some of the technical aspects and he understands and wishes to proceed. Prior to scheduling surgery we will also obtain an MRI of his abdomen since he has a contrast allergy and we will also get a CEA level with his preoperative lab work.      Plan:       Plan for laparoscopic assisted low anterior resection for a large  tubular adenoma at the rectosigmoid

## 2012-10-30 NOTE — Op Note (Signed)
10/30/2012  11:43 AM  PATIENT:  Corey Adams.  67 y.o. male  PRE-OPERATIVE DIAGNOSIS:  rectal mass  POST-OPERATIVE DIAGNOSIS:  rectal mass  PROCEDURE:  Procedure(s): LAPAROSCOPIC LOW ANTERIOR RESECTION with Rigid Proctoscopy (N/A)  SURGEON:  Surgeon(s) and Role:    * Robyne Askew, MD - Primary    * Mariella Saa, MD - Assisting  PHYSICIAN ASSISTANT:   ASSISTANTS: Dr. Johna Sheriff   ANESTHESIA:   general  EBL:  Total I/O In: 3250 [I.V.:3000; IV Piggyback:250] Out: 550 [Urine:450; Blood:100]  BLOOD ADMINISTERED:none  DRAINS: none   LOCAL MEDICATIONS USED:  MARCAINE     SPECIMEN:  Source of Specimen:  rectosigmoid colon with mass and separate distal margin  DISPOSITION OF SPECIMEN:  PATHOLOGY  COUNTS:  YES  TOURNIQUET:  * No tourniquets in log *  DICTATION: .Dragon Dictation After informed consent was obtained patient was brought to the operating room and placed in the supine position on the operating room table. After adequate induction of general anesthesia the patient was moved into lithotomy position and all pressure points were padded. The patient's abdomen was prepped with ChloraPrep, allowed to dry, and draped in the usual sterile manner. The perineum was prepped with chlorhexidine. A site was chosen on the right mid abdomen for placement of a 5 mm Optiview port. Using the camera and the Optiview port all of the layers of the abdominal wall were dissected bluntly under direct vision until access was gained to the abdominal cavity. This was done through a small stab incision on the right midabdomen. This area was infiltrated with quarter Marcaine. Once the port was in place the abdomen was insufflated with carbon dioxide without difficulty. The patient was placed in Trendelenburg position and rotated with the left side. 3 other 5 mm ports were placed under direct vision without difficulty. A harmonic scalpel was used to mobilize the rectosigmoid colon by  incising its retroperitoneal attachment along the white line of Toldt. Once this was accomplished and we felt as though we had mobilized enough of the distal colon we then made a small lower midline incision with a 10 blade knife. This incision was carried through the skin and subcutaneous tissue sharply with electrocautery until the linea alba was identified. The linea alba was incised with the electrocautery. The preperitoneal space was probed with a hemostat the peritoneum was opened and access was gained to the abdominal cavity. The rest of the incision was opened under direct vision with the electrocautery. The tattooing and the rectum was identifiable. We chose a site in the distal sigmoid colon for division of the colon. The mesentery at this point was opened sharply with the electrocautery. A GIA-75 stapler was placed across the colon at this point, clamped and fired thereby dividing the colon between staple lines. The mesentery to the rectosigmoid was then taken down with the LigaSure and Harmonic scalpel until we had reached the point of the tattooing on the rectum. The ureters were identified and care was taken to make sure they were well out of the way. Once we had dissected the mesorectum down far enough we then did a rigid sigmoidoscopy to make sure that we were below the tumor. The distal edge of the tumor was marked with a stitch during the rigid sigmoidoscopy. We then placed a contour stapler in the pelvis below the area of the tumor, clamped and fired the stapler thereby dividing the rectum between staple lines. The specimen was examined on the  back table and it did look as though we were below the tumor. We then sent the specimen to pathology for further evaluation. The proximal colon appeared healthy with good blood supply. We cleaned some of the fat off of the staple line both bluntly with a hemostat and sharply with the electrocautery. The staple line on the proximal colon was then removed  sharply with the electrocautery. A 2-0 Prolene stitch was then placed circumferentially around the edge of the colon. The colon was sized with EEA sizers and would only take a 25 mm probe. The 25 EEA stapler was then chosen. The anvil was placed in the proximal colon and the Prolene pursestring stitch was cinched tight and tied. Next we placed the EEA stapler through the rectum and positioned along the rectal staple line. The spike was deployed. The anvil was then attached to the spike and the stapling device was closed into the green range. The stapler was then fired creating a nice widely patent anastomosis between the distal colon and the rectal stump. The anastomosis was reinforced in 3 places with interrupted 2-0 silk stitches. The stapling device was removed without difficulty. A rigid sigmoidoscopy was performed again and the anastomosis was visualized and appeared healthy and intact. Saline was used to fill the pelvis and as the rectosigmoid was insufflated there were no bubbles to indicate any evidence of leak. The wound was then irrigated with copious amounts of saline. All gloves and gowns were changed. The fascia of the anterior bowel wall was then closed with 2 running #1 double-stranded looped PDS sutures. The subcutaneous tissue was irrigated with copious amounts of saline. The skin was closed with staples. Sterile dressings were applied. The patient tolerated the procedure well. At the end of the case all needle sponge and instrument counts were correct. The patient was then awakened and taken to recovery in stable condition.  PLAN OF CARE: Admit to inpatient   PATIENT DISPOSITION:  PACU - hemodynamically stable.   Delay start of Pharmacological VTE agent (>24hrs) due to surgical blood loss or risk of bleeding: no

## 2012-10-30 NOTE — Anesthesia Procedure Notes (Signed)
Procedure Name: Intubation Date/Time: 10/30/2012 8:40 AM Performed by: Lovie Chol Pre-anesthesia Checklist: Patient identified, Emergency Drugs available, Suction available, Patient being monitored and Timeout performed Patient Re-evaluated:Patient Re-evaluated prior to inductionOxygen Delivery Method: Circle system utilized Preoxygenation: Pre-oxygenation with 100% oxygen Intubation Type: IV induction Ventilation: Mask ventilation without difficulty and Oral airway inserted - appropriate to patient size Laryngoscope Size: Miller and 2 Grade View: Grade I Tube type: Oral Tube size: 7.5 mm Number of attempts: 1 Airway Equipment and Method: Stylet and LTA kit utilized Placement Confirmation: ETT inserted through vocal cords under direct vision,  positive ETCO2,  CO2 detector and breath sounds checked- equal and bilateral Secured at: 23 cm Tube secured with: Tape Dental Injury: Teeth and Oropharynx as per pre-operative assessment

## 2012-10-30 NOTE — Transfer of Care (Signed)
Immediate Anesthesia Transfer of Care Note  Patient: Corey Adams.  Procedure(s) Performed: Procedure(s): LAPAROSCOPIC LOW ANTERIOR RESECTION with Rigid Proctoscopy (N/A)  Patient Location: PACU  Anesthesia Type:General  Level of Consciousness: oriented, sedated and patient cooperative  Airway & Oxygen Therapy: Patient Spontanous Breathing and Patient connected to face mask oxygen  Post-op Assessment: Report given to PACU RN and Post -op Vital signs reviewed and stable  Post vital signs: Reviewed and stable  Complications: No apparent anesthesia complications

## 2012-10-30 NOTE — Preoperative (Signed)
Beta Blockers   Reason not to administer Beta Blockers:Not Applicable 

## 2012-10-30 NOTE — Anesthesia Preprocedure Evaluation (Addendum)
Anesthesia Evaluation  Patient identified by MRN, date of birth, ID band Patient awake    Reviewed: Allergy & Precautions, H&P , NPO status , Patient's Chart, lab work & pertinent test results, reviewed documented beta blocker date and time   Airway Mallampati: II TM Distance: >3 FB Neck ROM: Full    Dental no notable dental hx. (+) Teeth Intact and Dental Advisory Given   Pulmonary asthma , sleep apnea ,  breath sounds clear to auscultation  Pulmonary exam normal       Cardiovascular hypertension, On Medications and On Home Beta Blockers Rhythm:Regular Rate:Normal     Neuro/Psych  Headaches, PSYCHIATRIC DISORDERS  Neuromuscular disease    GI/Hepatic Neg liver ROS, GERD-  Medicated and Controlled,  Endo/Other  negative endocrine ROS  Renal/GU Renal disease  negative genitourinary   Musculoskeletal   Abdominal   Peds  Hematology negative hematology ROS (+)   Anesthesia Other Findings   Reproductive/Obstetrics negative OB ROS                          Anesthesia Physical Anesthesia Plan  ASA: III  Anesthesia Plan: General   Post-op Pain Management:    Induction: Intravenous  Airway Management Planned: Oral ETT  Additional Equipment:   Intra-op Plan:   Post-operative Plan: Extubation in OR  Informed Consent: I have reviewed the patients History and Physical, chart, labs and discussed the procedure including the risks, benefits and alternatives for the proposed anesthesia with the patient or authorized representative who has indicated his/her understanding and acceptance.   Dental advisory given  Plan Discussed with: CRNA  Anesthesia Plan Comments:         Anesthesia Quick Evaluation

## 2012-10-31 LAB — BASIC METABOLIC PANEL
BUN: 11 mg/dL (ref 6–23)
CO2: 26 mEq/L (ref 19–32)
Chloride: 105 mEq/L (ref 96–112)
Creatinine, Ser: 0.92 mg/dL (ref 0.50–1.35)
GFR calc Af Amer: >90 mL/min (ref >90)

## 2012-10-31 LAB — CBC
HCT: 37.8 % — ABNORMAL LOW (ref 39.0–52.0)
MCHC: 34.7 g/dL (ref 30.0–36.0)
MCV: 91.5 fL (ref 78.0–100.0)
RDW: 12.6 % (ref 11.5–15.5)

## 2012-10-31 MED ORDER — HYDROMORPHONE HCL PF 1 MG/ML IJ SOLN
1.0000 mg | INTRAMUSCULAR | Status: DC | PRN
  Administered 2012-10-31 – 2012-11-01 (×3): 1 mg via INTRAVENOUS
  Filled 2012-10-31 (×3): qty 1

## 2012-10-31 NOTE — Progress Notes (Signed)
1 Day Post-Op  Subjective: No complaints. He slept better without the pca beeping  Objective: Vital signs in last 24 hours: Temp:  [97 F (36.1 C)-99 F (37.2 C)] 98.6 F (37 C) (07/31 0153) Pulse Rate:  [64-106] 91 (07/31 0153) Resp:  [9-19] 18 (07/31 0153) BP: (101-139)/(48-97) 111/66 mmHg (07/31 0153) SpO2:  [96 %-100 %] 96 % (07/31 0153) Weight:  [200 lb 9.9 oz (91 kg)] 200 lb 9.9 oz (91 kg) (07/30 1340) Last BM Date: 10/29/12  Intake/Output from previous day: 07/30 0701 - 07/31 0700 In: 3973.3 [I.V.:3723.3; IV Piggyback:250] Out: 1225 [Urine:1125; Blood:100] Intake/Output this shift: Total I/O In: -  Out: 250 [Urine:250]  GI: soft, mild tenderness. few bs. incision looks good  Lab Results:   Recent Labs  10/30/12 1444  WBC 8.2  HGB 14.1  HCT 39.5  PLT 162   BMET  Recent Labs  10/30/12 1444  CREATININE 1.02   PT/INR No results found for this basename: LABPROT, INR,  in the last 72 hours ABG No results found for this basename: PHART, PCO2, PO2, HCO3,  in the last 72 hours  Studies/Results: No results found.  Anti-infectives: Anti-infectives   Start     Dose/Rate Route Frequency Ordered Stop   10/30/12 0600  cefOXitin (MEFOXIN) 2 g in dextrose 5 % 50 mL IVPB     2 g 100 mL/hr over 30 Minutes Intravenous On call to O.R. 10/29/12 1442 10/30/12 0835      Assessment/Plan: s/p Procedure(s): LAPAROSCOPIC LOW ANTERIOR RESECTION with Rigid Proctoscopy (N/A) Will allow ice chips today OOB  LOS: 1 day    TOTH III,Arva Slaugh S 10/31/2012

## 2012-11-01 ENCOUNTER — Encounter (HOSPITAL_COMMUNITY): Payer: Self-pay | Admitting: General Surgery

## 2012-11-01 MED ORDER — ESCITALOPRAM OXALATE 10 MG PO TABS
10.0000 mg | ORAL_TABLET | Freq: Every day | ORAL | Status: DC
  Administered 2012-11-01 – 2012-11-02 (×2): 10 mg via ORAL
  Filled 2012-11-01 (×3): qty 1

## 2012-11-01 MED ORDER — BISOPROLOL-HYDROCHLOROTHIAZIDE 2.5-6.25 MG PO TABS
1.0000 | ORAL_TABLET | Freq: Every day | ORAL | Status: DC
  Administered 2012-11-01 – 2012-11-02 (×2): 1 via ORAL
  Filled 2012-11-01 (×4): qty 1

## 2012-11-01 MED ORDER — SALINE SPRAY 0.65 % NA SOLN
1.0000 | NASAL | Status: DC | PRN
  Filled 2012-11-01: qty 44

## 2012-11-01 NOTE — Progress Notes (Signed)
2 Days Post-Op  Subjective: Pt says he had an anxiety attack last night. May have been triggered by a headache. Denies abdominal pain  Objective: Vital signs in last 24 hours: Temp:  [98.6 F (37 C)-99.2 F (37.3 C)] 98.6 F (37 C) (08/01 0510) Pulse Rate:  [85-90] 89 (08/01 0510) Resp:  [18-20] 18 (08/01 0510) BP: (127-143)/(64-72) 134/71 mmHg (08/01 0510) SpO2:  [94 %-96 %] 95 % (08/01 0510) Last BM Date: 10/29/12  Intake/Output from previous day: 07/31 0701 - 08/01 0700 In: 800 [I.V.:800] Out: 1450 [Urine:1450] Intake/Output this shift:    GI: soft, minimal tenderness. incision looks good. few bs  Lab Results:   Recent Labs  10/30/12 1444 10/31/12 0450  WBC 8.2 6.8  HGB 14.1 13.1  HCT 39.5 37.8*  PLT 162 151   BMET  Recent Labs  10/30/12 1444 10/31/12 0450  NA  --  137  K  --  4.1  CL  --  105  CO2  --  26  GLUCOSE  --  146*  BUN  --  11  CREATININE 1.02 0.92  CALCIUM  --  8.7   PT/INR No results found for this basename: LABPROT, INR,  in the last 72 hours ABG No results found for this basename: PHART, PCO2, PO2, HCO3,  in the last 72 hours  Studies/Results: No results found.  Anti-infectives: Anti-infectives   Start     Dose/Rate Route Frequency Ordered Stop   10/30/12 0600  cefOXitin (MEFOXIN) 2 g in dextrose 5 % 50 mL IVPB     2 g 100 mL/hr over 30 Minutes Intravenous On call to O.R. 10/29/12 1442 10/30/12 0835      Assessment/Plan: s/p Procedure(s): LAPAROSCOPIC LOW ANTERIOR RESECTION with Rigid Proctoscopy (N/A) Allow clears today Will restart lexapro for anxiety Will restart ziac for BP although his bp has been good Ambulate D/C foley  LOS: 2 days    TOTH III,Dorothea Yow S 11/01/2012

## 2012-11-02 MED ORDER — HYDROCODONE-ACETAMINOPHEN 5-325 MG PO TABS
1.0000 | ORAL_TABLET | ORAL | Status: DC | PRN
  Administered 2012-11-02 – 2012-11-03 (×3): 1 via ORAL
  Filled 2012-11-02 (×3): qty 1

## 2012-11-02 MED ORDER — PANTOPRAZOLE SODIUM 40 MG PO TBEC
40.0000 mg | DELAYED_RELEASE_TABLET | Freq: Every day | ORAL | Status: DC
  Administered 2012-11-02: 40 mg via ORAL
  Filled 2012-11-02: qty 1

## 2012-11-02 NOTE — Progress Notes (Signed)
At 1600,  IV site leaking, dc'd. Pt tolerating FL diet and po pain med, will not restart.

## 2012-11-02 NOTE — Progress Notes (Signed)
Pharmacy Brief Note - Alvimopan (Entereg)  The standing order set for alvimopan (Entereg) now includes an automatic order to discontinue the drug after the patient has had a bowel movement. The change was approved by the Pharmacy & Therapeutics Committee and the Medical Executive Committee.  This patient has had a bowel movement documented by nursing. Therefore, alvimopan has been discontinued.  Thank you-  Herby Abraham, Pharm.D. [phone] 11/02/2012 10:10 AM

## 2012-11-02 NOTE — Progress Notes (Signed)
3 Days Post-Op  Subjective: He feels good. No more nausea or panic attacks. He has had bm last night and this am  Objective: Vital signs in last 24 hours: Temp:  [98.2 F (36.8 C)-100.6 F (38.1 C)] 99.6 F (37.6 C) (08/02 0610) Pulse Rate:  [77-85] 77 (08/02 0610) Resp:  [16-20] 16 (08/02 0610) BP: (129-142)/(74-79) 142/74 mmHg (08/02 0610) SpO2:  [94 %-97 %] 97 % (08/02 0610) Last BM Date: 11/01/12  Intake/Output from previous day: 08/01 0701 - 08/02 0700 In: 1747.5 [P.O.:480; I.V.:1267.5] Out: 1000 [Urine:1000] Intake/Output this shift:    GI: soft, nontender. good bs. flatus and bm. incision looks good  Lab Results:   Recent Labs  10/30/12 1444 10/31/12 0450  WBC 8.2 6.8  HGB 14.1 13.1  HCT 39.5 37.8*  PLT 162 151   BMET  Recent Labs  10/30/12 1444 10/31/12 0450  NA  --  137  K  --  4.1  CL  --  105  CO2  --  26  GLUCOSE  --  146*  BUN  --  11  CREATININE 1.02 0.92  CALCIUM  --  8.7   PT/INR No results found for this basename: LABPROT, INR,  in the last 72 hours ABG No results found for this basename: PHART, PCO2, PO2, HCO3,  in the last 72 hours  Studies/Results: No results found.  Anti-infectives: Anti-infectives   Start     Dose/Rate Route Frequency Ordered Stop   10/30/12 0600  cefOXitin (MEFOXIN) 2 g in dextrose 5 % 50 mL IVPB     2 g 100 mL/hr over 30 Minutes Intravenous On call to O.R. 10/29/12 1442 10/30/12 0835      Assessment/Plan: s/p Procedure(s): LAPAROSCOPIC LOW ANTERIOR RESECTION with Rigid Proctoscopy (N/A) Advance diet ambulate  LOS: 3 days    TOTH III,PAUL S 11/02/2012

## 2012-11-03 NOTE — Discharge Summary (Signed)
Physician Discharge Summary  Patient ID: Corey Adams. MRN: [id number] DOB/AGE: 06-09-1945 67 y.o.  Admit date: 10/30/2012 Discharge date: 11/03/2012  Admission Diagnoses:  Discharge Diagnoses:  Active Problems:   * No active hospital problems. *   Discharged Condition: good  Hospital Course: The pt underwent lap assisted LAR. He tolerated the surgery well. His postop course was uneventful. He gradually improved and today is ready for discharge home  Consults: None  Significant Diagnostic Studies: none  Treatments: surgery: as above  Discharge Exam: Blood pressure 127/79, pulse 77, temperature 99.4 F (37.4 C), temperature source Oral, resp. rate 17, height 5' 7.5" (1.715 m), weight 200 lb 9.9 oz (91 kg), SpO2 96.00%. GI: soft, minimal tenderness  Disposition: Final discharge disposition not confirmed  Discharge Orders   Future Orders Complete By Expires     Call MD for:  difficulty breathing, headache or visual disturbances  As directed     Call MD for:  extreme fatigue  As directed     Call MD for:  hives  As directed     Call MD for:  persistant dizziness or light-headedness  As directed     Call MD for:  persistant nausea and vomiting  As directed     Call MD for:  redness, tenderness, or signs of infection (pain, swelling, redness, odor or green/yellow discharge around incision site)  As directed     Call MD for:  severe uncontrolled pain  As directed     Call MD for:  temperature >100.4  As directed     Diet - low sodium heart healthy  As directed     Discharge instructions  As directed     Comments:      Do not lift more than 5lbs. May shower. Diet as tolerated. Avoid seeds and nuts. Use colace stool softener for next couple weeks    Increase activity slowly  As directed     No wound care  As directed         Medication List         aspirin EC 325 MG tablet  Take 325 mg by mouth 2 (two) times daily.     atorvastatin 10 MG tablet  Commonly known as:   LIPITOR  Take 10 mg by mouth daily.     bisoprolol-hydrochlorothiazide 2.5-6.25 MG per tablet  Commonly known as:  ZIAC  Take 1 tablet by mouth daily.     escitalopram 10 MG tablet  Commonly known as:  LEXAPRO  Take 10 mg by mouth daily.     levocetirizine 5 MG tablet  Commonly known as:  XYZAL  Take 5 mg by mouth every evening.     mometasone 50 MCG/ACT nasal spray  Commonly known as:  NASONEX  Place 2 sprays into the nose 2 (two) times daily as needed (congestion).     omeprazole 20 MG tablet  Commonly known as:  PRILOSEC OTC  Take 20 mg by mouth daily.           Follow-up Information   Follow up with Robyne Askew, MD In 1 week. (for staple removal)    Contact information:   [address] [place] [address] [phone]       Signed: Robyne Askew 11/03/2012, 7:53 AM

## 2012-11-03 NOTE — Progress Notes (Signed)
4 Days Post-Op  Subjective: Pt doing well, ambulating in hallways.  Little abdominal soreness alleviated with norco.  Denies fever or chills.  Tolerating diet.  Voiding without difficulties.    Objective: Vital signs in last 24 hours: Temp:  [99.4 F (37.4 C)-99.9 F (37.7 C)] 99.4 F (37.4 C) (08/03 0610) Pulse Rate:  [69-77] 77 (08/03 0610) Resp:  [17-20] 17 (08/03 0610) BP: (127-143)/(79-88) 127/79 mmHg (08/03 0610) SpO2:  [95 %-97 %] 96 % (08/03 0610) Last BM Date: 11/01/12  Intake/Output from previous day: 08/02 0701 - 08/03 0700 In: 720 [P.O.:720] Out: 300 [Urine:300] Intake/Output this shift:   Abd: +bs soft, round and non tender.  Midline incision-edges are approximated, no erythema, staples are intact. Minimal serosanguinous output   Anti-infectives: Anti-infectives   Start     Dose/Rate Route Frequency Ordered Stop   10/30/12 0600  cefOXitin (MEFOXIN) 2 g in dextrose 5 % 50 mL IVPB     2 g 100 mL/hr over 30 Minutes Intravenous On call to O.R. 10/29/12 1442 10/30/12 0835      Assessment/Plan: s/p Procedure(s): LAPAROSCOPIC LOW ANTERIOR RESECTION with Rigid Proctoscopy (N/A) -stable for discharge, follow up instructions given.     LOS: 4 days   Corey Adams ANP-BC  11/03/2012 8:13 AM

## 2012-11-03 NOTE — Progress Notes (Signed)
4 Days Post-Op  Subjective: Feels good. Wants to go home  Objective: Vital signs in last 24 hours: Temp:  [99.4 F (37.4 C)-99.9 F (37.7 C)] 99.4 F (37.4 C) (08/03 0610) Pulse Rate:  [69-77] 77 (08/03 0610) Resp:  [17-20] 17 (08/03 0610) BP: (127-143)/(79-88) 127/79 mmHg (08/03 0610) SpO2:  [95 %-97 %] 96 % (08/03 0610) Last BM Date: 11/01/12  Intake/Output from previous day: 08/02 0701 - 08/03 0700 In: 720 [P.O.:720] Out: 300 [Urine:300] Intake/Output this shift:    GI: soft, minimal tenderness. good bs and flatus/bm. incision looks good  Lab Results:  No results found for this basename: WBC, HGB, HCT, PLT,  in the last 72 hours BMET No results found for this basename: NA, K, CL, CO2, GLUCOSE, BUN, CREATININE, CALCIUM,  in the last 72 hours PT/INR No results found for this basename: LABPROT, INR,  in the last 72 hours ABG No results found for this basename: PHART, PCO2, PO2, HCO3,  in the last 72 hours  Studies/Results: No results found.  Anti-infectives: Anti-infectives   Start     Dose/Rate Route Frequency Ordered Stop   10/30/12 0600  cefOXitin (MEFOXIN) 2 g in dextrose 5 % 50 mL IVPB     2 g 100 mL/hr over 30 Minutes Intravenous On call to O.R. 10/29/12 1442 10/30/12 0835      Assessment/Plan: s/p Procedure(s): LAPAROSCOPIC LOW ANTERIOR RESECTION with Rigid Proctoscopy (N/A) Advance diet Discharge  LOS: 4 days    TOTH III,Niajah Sipos S 11/03/2012

## 2012-11-07 ENCOUNTER — Encounter (INDEPENDENT_AMBULATORY_CARE_PROVIDER_SITE_OTHER): Payer: Self-pay | Admitting: General Surgery

## 2012-11-07 ENCOUNTER — Ambulatory Visit (INDEPENDENT_AMBULATORY_CARE_PROVIDER_SITE_OTHER): Payer: Medicare PPO | Admitting: General Surgery

## 2012-11-07 VITALS — BP 126/74 | HR 68 | Resp 16 | Ht 68.0 in | Wt 193.3 lb

## 2012-11-07 DIAGNOSIS — Z09 Encounter for follow-up examination after completed treatment for conditions other than malignant neoplasm: Secondary | ICD-10-CM

## 2012-11-07 MED ORDER — DOXYCYCLINE HYCLATE 100 MG PO TABS: 100.0000 mg | ORAL_TABLET | Freq: Two times a day (BID) | ORAL | Status: DC

## 2012-11-07 NOTE — Patient Instructions (Signed)
Pick up prescription at pharmacy Do wet-dry dressing changes at least once a day. Remove gauze, moisten gauze with saline, pack into wound (minimize water gauze on skin), cover with dry gauze and tape Call if symptoms worsen

## 2012-11-07 NOTE — Progress Notes (Signed)
Subjective:     Patient ID: Corey Forester., male   DOB: 08-Mar-1946, 67 y.o.   MRN: [id number]  HPI 67 yo WM s/p Lap assisted LAR on 7/30. Comes in complaining of redness/swelling and discomfort around lower incision. No f/c/n/v. Having BMs.   Review of Systems     Objective:   Physical Exam BP 126/74  Pulse 68  Resp 16  Ht 5\' 8"  (1.727 m)  Wt 193 lb 5.1 oz (87.689 kg)  BMI 29.4 kg/m2 Alert, nontoxic Soft, nd. Appropriate tenderness. Lower midline incision - has blanching erythema extending about 4cm on each side. Probable hematoma under incision       Assessment:     S/p Lap assisted LAR with mild cellulitis    Plan:     Removed staples. Drained a small hematoma. Doxycyline for 1 week. W-D dressing at least once a day. F/u as scheduled with Dr Carolynne Edouard.  Demonstrated wound care to family. All questions asked and answered.  Corey Adams. Andrey Campanile, MD, FACS General, Bariatric, & Minimally Invasive Surgery [facility], [place]

## 2012-11-13 ENCOUNTER — Ambulatory Visit (INDEPENDENT_AMBULATORY_CARE_PROVIDER_SITE_OTHER): Payer: Medicare PPO | Admitting: General Surgery

## 2012-11-13 ENCOUNTER — Encounter (INDEPENDENT_AMBULATORY_CARE_PROVIDER_SITE_OTHER): Payer: Self-pay | Admitting: General Surgery

## 2012-11-13 VITALS — BP 118/74 | HR 72 | Temp 98.5°F | Resp 14 | Ht 68.0 in | Wt 188.6 lb

## 2012-11-13 DIAGNOSIS — D128 Benign neoplasm of rectum: Secondary | ICD-10-CM

## 2012-11-13 MED ORDER — DOXYCYCLINE HYCLATE 100 MG PO TABS: 100.0000 mg | ORAL_TABLET | Freq: Two times a day (BID) | ORAL | Status: DC

## 2012-11-13 NOTE — Patient Instructions (Signed)
May shower. Change dressing twice a day

## 2012-11-13 NOTE — Progress Notes (Signed)
Subjective:     Patient ID: Corey Adams., male   DOB: 1946-01-20, 67 y.o.   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 His margins were clean. He came to the clinic last week with some redness and drainage along the midportion of the incision. Several staples were removed and the wound was opened slightly. He has been doing dressing changes to the area since then and things seem to be improving. He denies any abdominal pain. His appetite is good his bowels are working normally.  Review of Systems     Objective:   Physical Exam On exam his abdomen is soft and nontender. The midportion of his incision is opened slightly with a good clean wound bed And granulation tissue. There is no purulent drainage. The rest of the incision has healed well.    Assessment:     The patient is 2 weeks status post laparoscopic assisted low anterior resection complicated by a small superficial wound infection     Plan:     At this point he will continue to shower daily and change the dressing twice a day. He will refrain from any heavy lifting. I will plan to see him back in about 3 weeks to check the wound. He also was bit by a tick on his right lower leg. This area has some slight redness to it. I will write him a prescription for another week of doxycycline.

## 2012-12-05 ENCOUNTER — Encounter (INDEPENDENT_AMBULATORY_CARE_PROVIDER_SITE_OTHER): Payer: Self-pay | Admitting: General Surgery

## 2012-12-05 ENCOUNTER — Ambulatory Visit (INDEPENDENT_AMBULATORY_CARE_PROVIDER_SITE_OTHER): Payer: Medicare PPO | Admitting: General Surgery

## 2012-12-05 VITALS — BP 118/80 | HR 70 | Resp 18 | Ht 68.0 in | Wt 188.0 lb

## 2012-12-05 DIAGNOSIS — D128 Benign neoplasm of rectum: Secondary | ICD-10-CM

## 2012-12-05 NOTE — Patient Instructions (Signed)
Start increasing your activity next week Pack wound less tightly

## 2012-12-06 ENCOUNTER — Encounter (INDEPENDENT_AMBULATORY_CARE_PROVIDER_SITE_OTHER): Payer: Self-pay | Admitting: General Surgery

## 2012-12-06 NOTE — Progress Notes (Signed)
Subjective:     Patient ID: Corey Adams., male   DOB: 1946-01-28, 67 y.o.   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 His postoperative course has been complicated by a very small superficial wound infection at the inferior edge of the incision. This has been very clean and is not causing him any pain. There is been minimal drainage. He denies any fevers or chills. His appetite is good and his bowels are working normally.  Review of Systems     Objective:   Physical Exam On exam his abdomen is soft and nontender. His midline incision is almost completely healed except for one small area at the inferior age. This opening is very clean with good granulation tissue and no drainage    Assessment:     The patient is 5 weeks status post laparoscopic-assisted low anterior resection for a polyp with high-grade dysplasia     Plan:     At this point he will continue to keep the open area clean. He may be packing gauze to tightly which is not allowing it to close up. They will try packing a less tightly. We will plan to see him back in about 3 weeks to check the incision. Starting next week he will gradually increase his activity level

## 2012-12-26 ENCOUNTER — Encounter (INDEPENDENT_AMBULATORY_CARE_PROVIDER_SITE_OTHER): Payer: Self-pay | Admitting: General Surgery

## 2012-12-26 ENCOUNTER — Ambulatory Visit (INDEPENDENT_AMBULATORY_CARE_PROVIDER_SITE_OTHER): Payer: Medicare PPO | Admitting: General Surgery

## 2012-12-26 VITALS — BP 110/68 | HR 68 | Temp 98.9°F | Resp 14 | Ht 68.0 in | Wt 195.0 lb

## 2012-12-26 DIAGNOSIS — D128 Benign neoplasm of rectum: Secondary | ICD-10-CM

## 2012-12-26 NOTE — Progress Notes (Signed)
Subjective:     Patient ID: Corey Forester., male   DOB: 25-Apr-1945, 67 y.o.   MRN: [id number]  HPI The patient is a 67 -year-old white male who is 8 weeks status post low anterior resection for a rectosigmoid polyp that had dysplasia but no malignancy. His postoperative course was complicated by a very small superficial wound infection which is now resolved. He denies any abdominal pain. His appetite is good and his bowels are working normally.  Review of Systems     Objective:   Physical Exam On exam his abdomen is soft and nontender. His midline incision has healed with no sign of infection    Assessment:     The patient is 8 weeks status post low anterior resection     Plan:     At this point I think he can begin returning to his normal activities without restriction. We will plan to see him back in about 6 months to check his progress. He will need a followup colonoscopy At about a year

## 2012-12-26 NOTE — Patient Instructions (Signed)
Call if you have any problems 

## 2013-02-06 ENCOUNTER — Other Ambulatory Visit: Payer: Self-pay

## 2013-06-16 ENCOUNTER — Ambulatory Visit (INDEPENDENT_AMBULATORY_CARE_PROVIDER_SITE_OTHER): Payer: Medicare PPO | Admitting: General Surgery

## 2013-07-07 ENCOUNTER — Encounter (INDEPENDENT_AMBULATORY_CARE_PROVIDER_SITE_OTHER): Payer: Self-pay | Admitting: General Surgery

## 2013-07-07 ENCOUNTER — Ambulatory Visit (INDEPENDENT_AMBULATORY_CARE_PROVIDER_SITE_OTHER): Payer: Medicare PPO | Admitting: General Surgery

## 2013-07-07 VITALS — BP 110/82 | HR 78 | Temp 97.1°F | Resp 14 | Ht 68.0 in | Wt 193.8 lb

## 2013-07-07 DIAGNOSIS — D128 Benign neoplasm of rectum: Secondary | ICD-10-CM

## 2013-07-07 DIAGNOSIS — D129 Benign neoplasm of anus and anal canal: Secondary | ICD-10-CM

## 2013-07-07 NOTE — Progress Notes (Signed)
Subjective:     Patient ID: Corey Adams., male   DOB: 1945/11/13, 68 y.o.   MRN: [id number]  HPI The patient is a 68 year old white male who is 8 months status post low anterior resection for a rectal polyp that had dysplasia in it but no malignancy. He has done well since the surgery and has no complaints today. He denies any abdominal pain. His appetite is good his bowels are working normally. He has been maintaining his weight well.  Review of Systems  Constitutional: Negative.   HENT: Negative.   Eyes: Negative.   Respiratory: Negative.   Cardiovascular: Negative.   Gastrointestinal: Negative.   Endocrine: Negative.   Genitourinary: Negative.   Musculoskeletal: Negative.   Skin: Negative.   Allergic/Immunologic: Negative.   Neurological: Negative.   Hematological: Negative.   Psychiatric/Behavioral: Negative.        Objective:   Physical Exam  Constitutional: He is oriented to person, place, and time. He appears well-developed and well-nourished.  HENT:  Head: Normocephalic and atraumatic.  Eyes: Conjunctivae and EOM are normal. Pupils are equal, round, and reactive to light.  Neck: Normal range of motion. Neck supple.  Cardiovascular: Normal rate, regular rhythm and normal heart sounds.   Pulmonary/Chest: Effort normal and breath sounds normal.  Abdominal: Soft. Bowel sounds are normal.  Genitourinary:  There is no palpable groin or supraclavicular lymphadenopathy  Musculoskeletal: Normal range of motion.  Neurological: He is alert and oriented to person, place, and time.  Skin: Skin is warm and dry.  Psychiatric: He has a normal mood and affect. His behavior is normal.       Assessment:     The patient is 8 months status post low anterior resection for a dysplastic polyp     Plan:     At this point we will refer him back to the gastroenterologists for a followup colonoscopy. If this is clean and we can plan to see him back on a when necessary basis

## 2013-07-07 NOTE — Patient Instructions (Addendum)
Follow up with [facility] for colonoscopy with Dr. Penelope Coop

## 2013-08-04 ENCOUNTER — Telehealth (INDEPENDENT_AMBULATORY_CARE_PROVIDER_SITE_OTHER): Payer: Self-pay

## 2013-08-04 NOTE — Telephone Encounter (Signed)
Per Dr Penelope Coop office, repeat colonoscopy due in 2 years. insurance will not pay until then.

## 2014-02-06 ENCOUNTER — Other Ambulatory Visit: Payer: Self-pay | Admitting: Orthopedic Surgery

## 2014-02-20 ENCOUNTER — Encounter (HOSPITAL_COMMUNITY)
Admission: RE | Admit: 2014-02-20 | Discharge: 2014-02-20 | Disposition: A | Discharge status: Home / Self Care | Payer: Medicare PPO | Source: Ambulatory Visit | Attending: Orthopedic Surgery | Admitting: Orthopedic Surgery

## 2014-02-20 ENCOUNTER — Encounter (HOSPITAL_COMMUNITY): Payer: Self-pay

## 2014-02-20 ENCOUNTER — Ambulatory Visit (HOSPITAL_COMMUNITY)
Admission: RE | Admit: 2014-02-20 | Discharge: 2014-02-20 | Disposition: A | Discharge status: Home / Self Care | Payer: Medicare PPO | Source: Ambulatory Visit | Attending: Orthopedic Surgery | Admitting: Orthopedic Surgery

## 2014-02-20 DIAGNOSIS — I10 Essential (primary) hypertension: Secondary | ICD-10-CM | POA: Insufficient documentation

## 2014-02-20 DIAGNOSIS — Z01818 Encounter for other preprocedural examination: Secondary | ICD-10-CM | POA: Diagnosis not present

## 2014-02-20 LAB — CBC WITH DIFFERENTIAL/PLATELET
BASOS ABS: 0 10*3/uL (ref 0.0–0.1)
Basophils Relative: 1 % (ref 0–1)
EOS ABS: 0.2 10*3/uL (ref 0.0–0.7)
EOS PCT: 3 % (ref 0–5)
HEMATOCRIT: 46 % (ref 39.0–52.0)
Hemoglobin: 15.7 g/dL (ref 13.0–17.0)
LYMPHS PCT: 43 % (ref 12–46)
Lymphs Abs: 2.6 10*3/uL (ref 0.7–4.0)
MCH: 31 pg (ref 26.0–34.0)
MCHC: 34.1 g/dL (ref 30.0–36.0)
MCV: 90.9 fL (ref 78.0–100.0)
Monocytes Absolute: 0.4 10*3/uL (ref 0.1–1.0)
Monocytes Relative: 7 % (ref 3–12)
Neutro Abs: 2.8 10*3/uL (ref 1.7–7.7)
Neutrophils Relative %: 46 % (ref 43–77)
PLATELETS: 207 10*3/uL (ref 150–400)
RBC: 5.06 MIL/uL (ref 4.22–5.81)
RDW: 13.4 % (ref 11.5–15.5)
WBC: 6 10*3/uL (ref 4.0–10.5)

## 2014-02-20 LAB — COMPREHENSIVE METABOLIC PANEL
ALT: 29 U/L (ref 0–53)
AST: 24 U/L (ref 0–37)
Albumin: 3.9 g/dL (ref 3.5–5.2)
Alkaline Phosphatase: 76 U/L (ref 39–117)
Anion gap: 14 (ref 5–15)
BUN: 14 mg/dL (ref 6–23)
CALCIUM: 9.3 mg/dL (ref 8.4–10.5)
CO2: 24 mEq/L (ref 19–32)
CREATININE: 1.12 mg/dL (ref 0.50–1.35)
Chloride: 103 mEq/L (ref 96–112)
GFR calc non Af Amer: 67 mL/min — ABNORMAL LOW (ref >90)
GFR, EST AFRICAN AMERICAN: 77 mL/min — AB (ref >90)
Glucose, Bld: 96 mg/dL (ref 70–99)
Potassium: 4.5 mEq/L (ref 3.7–5.3)
SODIUM: 141 meq/L (ref 137–147)
TOTAL PROTEIN: 7.3 g/dL (ref 6.0–8.3)
Total Bilirubin: 0.4 mg/dL (ref 0.3–1.2)

## 2014-02-20 LAB — URINALYSIS, ROUTINE W REFLEX MICROSCOPIC
BILIRUBIN URINE: NEGATIVE
GLUCOSE, UA: NEGATIVE mg/dL
Hgb urine dipstick: NEGATIVE
KETONES UR: NEGATIVE mg/dL
Leukocytes, UA: NEGATIVE
Nitrite: NEGATIVE
PH: 5.5 (ref 5.0–8.0)
Protein, ur: NEGATIVE mg/dL
Specific Gravity, Urine: 1.023 (ref 1.005–1.030)
Urobilinogen, UA: 0.2 mg/dL (ref 0.0–1.0)

## 2014-02-20 LAB — APTT: aPTT: 35 seconds (ref 24–37)

## 2014-02-20 LAB — TYPE AND SCREEN
ABO/RH(D): O POS
Antibody Screen: NEGATIVE

## 2014-02-20 LAB — PROTIME-INR
INR: 1.02 (ref 0.00–1.49)
Prothrombin Time: 13.5 seconds (ref 11.6–15.2)

## 2014-02-20 LAB — SURGICAL PCR SCREEN
MRSA, PCR: NEGATIVE
STAPHYLOCOCCUS AUREUS: NEGATIVE

## 2014-02-20 NOTE — Pre-Procedure Instructions (Signed)
Corey Adams.  02/20/2014   Your procedure is scheduled on:  Thursday, December 3rd  Report to [facility] Admitting at 530 AM.  Call this number if you have problems the morning of surgery: [phone]   Remember:   Do not eat food or drink liquids after midnight.   Take these medicines the morning of surgery with A SIP OF WATER: ziac, lexapro, prilosec, nasonex   Do not wear jewelry  Do not wear lotions, powders, or perfumes,deodorant.  Do not shave 48 hours prior to surgery. Men may shave face and neck.  Do not bring valuables to the hospital.  [facility] is not responsible for any belongings or valuables.               Contacts, dentures or bridgework may not be worn into surgery.  Leave suitcase in the car. After surgery it may be brought to your room.  For patients admitted to the hospital, discharge time is determined by your treatment team.               Patients discharged the day of surgery will not be allowed to drive home.  Please read over the following fact sheets that you were given: Pain Booklet, Coughing and Deep Breathing, Blood Transfusion Information, MRSA Information and Surgical Site Infection Prevention  [place] - Preparing for Surgery  Before surgery, you can play an important role.  Because skin is not sterile, your skin needs to be as free of germs as possible.  You can reduce the number of germs on you skin by washing with CHG (chlorahexidine gluconate) soap before surgery.  CHG is an antiseptic cleaner which kills germs and bonds with the skin to continue killing germs even after washing.  Please DO NOT use if you have an allergy to CHG or antibacterial soaps.  If your skin becomes reddened/irritated stop using the CHG and inform your nurse when you arrive at Short Stay.  Do not shave (including legs and underarms) for at least 48 hours prior to the first CHG shower.  You may shave your face.  Please follow these instructions  carefully:   1.  Shower with CHG Soap the night before surgery and the morning of Surgery.  2.  If you choose to wash your hair, wash your hair first as usual with your normal shampoo.  3.  After you shampoo, rinse your hair and body thoroughly to remove the shampoo.  4.  Use CHG as you would any other liquid soap.  You can apply CHG directly to the skin and wash gently with scrungie or a clean washcloth.  5.  Apply the CHG Soap to your body ONLY FROM THE NECK DOWN.  Do not use on open wounds or open sores.  Avoid contact with your eyes, ears, mouth and genitals (private parts).  Wash genitals (private parts) with your normal soap.  6.  Wash thoroughly, paying special attention to the area where your surgery will be performed.  7.  Thoroughly rinse your body with warm water from the neck down.  8.  DO NOT shower/wash with your normal soap after using and rinsing off the CHG Soap.  9.  Pat yourself dry with a clean towel.            10.  Wear clean pajamas.            11.  Place clean sheets on your bed the night of your first shower  and do not sleep with pets.  Day of Surgery  Do not apply any lotions/deoderants the morning of surgery.  Please wear clean clothes to the hospital/surgery center.

## 2014-02-20 NOTE — Progress Notes (Signed)
Primary - [facility] [address] No cardiologist No recent cardiac testing

## 2014-03-04 MED ORDER — CEFAZOLIN SODIUM-DEXTROSE 2-3 GM-% IV SOLR
2.0000 g | INTRAVENOUS | Status: AC
  Administered 2014-03-05: 2 g via INTRAVENOUS
  Filled 2014-03-04: qty 50

## 2014-03-04 NOTE — Anesthesia Preprocedure Evaluation (Addendum)
Anesthesia Evaluation  Patient identified by MRN, date of birth, ID band  Reviewed: Allergy & Precautions, H&P , NPO status , Patient's Chart, lab work & pertinent test results, reviewed documented beta blocker date and time   Airway Mallampati: II   Neck ROM: Full    Dental  (+) Teeth Intact   Pulmonary asthma , sleep apnea ,  breath sounds clear to auscultation        Cardiovascular hypertension, Pt. on medications Rhythm:Regular     Neuro/Psych Anxiety Depression    GI/Hepatic GERD-  ,  Endo/Other    Renal/GU      Musculoskeletal   Abdominal (+)  Abdomen: soft.    Peds  Hematology   Anesthesia Other Findings   Reproductive/Obstetrics                            Anesthesia Physical Anesthesia Plan  ASA: III  Anesthesia Plan: General   Post-op Pain Management: MAC Combined w/ Regional for Post-op pain   Induction:   Airway Management Planned: Oral ETT  Additional Equipment:   Intra-op Plan:   Post-operative Plan: Extubation in OR  Informed Consent: I have reviewed the patients History and Physical, chart, labs and discussed the procedure including the risks, benefits and alternatives for the proposed anesthesia with the patient or authorized representative who has indicated his/her understanding and acceptance.     Plan Discussed with:   Anesthesia Plan Comments:         Anesthesia Quick Evaluation

## 2014-03-05 ENCOUNTER — Encounter (HOSPITAL_COMMUNITY)
Admission: RE | Disposition: A | Discharge status: Home / Self Care | Payer: Self-pay | Source: Ambulatory Visit | Attending: Orthopedic Surgery

## 2014-03-05 ENCOUNTER — Encounter (HOSPITAL_COMMUNITY): Payer: Self-pay | Admitting: Certified Registered Nurse Anesthetist

## 2014-03-05 ENCOUNTER — Inpatient Hospital Stay (HOSPITAL_COMMUNITY)
Admission: RE | Admit: 2014-03-05 | Discharge: 2014-03-06 | DRG: 483 | Disposition: A | Discharge status: Home / Self Care | Payer: Medicare PPO | Source: Ambulatory Visit | Attending: Orthopedic Surgery | Admitting: Orthopedic Surgery

## 2014-03-05 ENCOUNTER — Inpatient Hospital Stay (HOSPITAL_COMMUNITY): Payer: Medicare PPO | Admitting: Anesthesiology

## 2014-03-05 ENCOUNTER — Inpatient Hospital Stay (HOSPITAL_COMMUNITY): Payer: Medicare PPO

## 2014-03-05 DIAGNOSIS — M19011 Primary osteoarthritis, right shoulder: Secondary | ICD-10-CM | POA: Diagnosis present

## 2014-03-05 DIAGNOSIS — I959 Hypotension, unspecified: Secondary | ICD-10-CM | POA: Diagnosis not present

## 2014-03-05 DIAGNOSIS — I10 Essential (primary) hypertension: Secondary | ICD-10-CM | POA: Diagnosis present

## 2014-03-05 DIAGNOSIS — F419 Anxiety disorder, unspecified: Secondary | ICD-10-CM | POA: Diagnosis present

## 2014-03-05 DIAGNOSIS — F329 Major depressive disorder, single episode, unspecified: Secondary | ICD-10-CM | POA: Diagnosis present

## 2014-03-05 DIAGNOSIS — Z7982 Long term (current) use of aspirin: Secondary | ICD-10-CM | POA: Diagnosis not present

## 2014-03-05 DIAGNOSIS — E785 Hyperlipidemia, unspecified: Secondary | ICD-10-CM | POA: Diagnosis present

## 2014-03-05 DIAGNOSIS — G4730 Sleep apnea, unspecified: Secondary | ICD-10-CM | POA: Diagnosis present

## 2014-03-05 DIAGNOSIS — Z96619 Presence of unspecified artificial shoulder joint: Secondary | ICD-10-CM

## 2014-03-05 DIAGNOSIS — M25511 Pain in right shoulder: Secondary | ICD-10-CM | POA: Diagnosis present

## 2014-03-05 DIAGNOSIS — K219 Gastro-esophageal reflux disease without esophagitis: Secondary | ICD-10-CM | POA: Diagnosis present

## 2014-03-05 DIAGNOSIS — Z96611 Presence of right artificial shoulder joint: Secondary | ICD-10-CM

## 2014-03-05 HISTORY — DX: Low back pain, unspecified: M54.50

## 2014-03-05 HISTORY — DX: Calculus of kidney: N20.0

## 2014-03-05 HISTORY — DX: Low back pain: M54.5

## 2014-03-05 HISTORY — DX: Other chronic pain: G89.29

## 2014-03-05 HISTORY — DX: Unspecified asthma, uncomplicated: J45.909

## 2014-03-05 HISTORY — DX: Allergy status to unspecified drugs, medicaments and biological substances: Z88.9

## 2014-03-05 HISTORY — PX: TOTAL SHOULDER ARTHROPLASTY: SHX126

## 2014-03-05 HISTORY — PX: DISTAL CLAVICLE EXCISION: SHX1463

## 2014-03-05 SURGERY — ARTHROPLASTY, SHOULDER, TOTAL
Anesthesia: General | Site: Shoulder | Laterality: Right

## 2014-03-05 MED ORDER — ONDANSETRON HCL 4 MG/2ML IJ SOLN
INTRAMUSCULAR | Status: AC
Start: 2014-03-05 — End: 2014-03-05
  Filled 2014-03-05: qty 2

## 2014-03-05 MED ORDER — BISOPROLOL-HYDROCHLOROTHIAZIDE 2.5-6.25 MG PO TABS
1.0000 | ORAL_TABLET | Freq: Every day | ORAL | Status: DC
  Administered 2014-03-06: 1 via ORAL
  Filled 2014-03-05: qty 1

## 2014-03-05 MED ORDER — ZOLPIDEM TARTRATE 5 MG PO TABS: 5.0000 mg | ORAL_TABLET | Freq: Every evening | ORAL | Status: DC | PRN

## 2014-03-05 MED ORDER — PROPOFOL 10 MG/ML IV BOLUS
INTRAVENOUS | Status: AC
  Filled 2014-03-05: qty 20

## 2014-03-05 MED ORDER — ROCURONIUM BROMIDE 100 MG/10ML IV SOLN
INTRAVENOUS | Status: DC | PRN
  Administered 2014-03-05: 40 mg via INTRAVENOUS
  Administered 2014-03-05: 10 mg via INTRAVENOUS

## 2014-03-05 MED ORDER — MIDAZOLAM HCL 5 MG/5ML IJ SOLN
INTRAMUSCULAR | Status: DC | PRN
  Administered 2014-03-05: 2 mg via INTRAVENOUS

## 2014-03-05 MED ORDER — SODIUM CHLORIDE 0.9 % IV SOLN
INTRAVENOUS | Status: DC
  Administered 2014-03-05: 16:00:00 via INTRAVENOUS

## 2014-03-05 MED ORDER — BISACODYL 10 MG RE SUPP: 10.0000 mg | Freq: Every day | RECTAL | Status: DC | PRN

## 2014-03-05 MED ORDER — ONDANSETRON HCL 4 MG/2ML IJ SOLN
INTRAMUSCULAR | Status: DC | PRN
  Administered 2014-03-05: 4 mg via INTRAVENOUS

## 2014-03-05 MED ORDER — ATROPINE SULFATE 1 MG/ML IJ SOLN
INTRAMUSCULAR | Status: DC | PRN
  Administered 2014-03-05: .3 mg via INTRAVENOUS

## 2014-03-05 MED ORDER — DIPHENHYDRAMINE HCL 12.5 MG/5ML PO ELIX: 12.5000 mg | ORAL_SOLUTION | ORAL | Status: DC | PRN

## 2014-03-05 MED ORDER — EPHEDRINE SULFATE 50 MG/ML IJ SOLN
INTRAMUSCULAR | Status: AC
  Filled 2014-03-05: qty 1

## 2014-03-05 MED ORDER — OXYCODONE HCL 5 MG PO TABS: 5.0000 mg | ORAL_TABLET | ORAL | Status: DC | PRN

## 2014-03-05 MED ORDER — OXYCODONE-ACETAMINOPHEN 5-325 MG PO TABS
1.0000 | ORAL_TABLET | ORAL | Status: DC | PRN
  Administered 2014-03-05 – 2014-03-06 (×2): 1 via ORAL
  Filled 2014-03-05: qty 2
  Filled 2014-03-05 (×2): qty 1

## 2014-03-05 MED ORDER — BUPIVACAINE LIPOSOME 1.3 % IJ SUSP
20.0000 mL | INTRAMUSCULAR | Status: DC
  Filled 2014-03-05: qty 20

## 2014-03-05 MED ORDER — METOCLOPRAMIDE HCL 5 MG/ML IJ SOLN: 5.0000 mg | Freq: Three times a day (TID) | INTRAMUSCULAR | Status: DC | PRN

## 2014-03-05 MED ORDER — ONDANSETRON HCL 4 MG/2ML IJ SOLN: 4.0000 mg | Freq: Four times a day (QID) | INTRAMUSCULAR | Status: DC | PRN

## 2014-03-05 MED ORDER — METHOCARBAMOL 1000 MG/10ML IJ SOLN
500.0000 mg | Freq: Four times a day (QID) | INTRAVENOUS | Status: DC | PRN
  Filled 2014-03-05: qty 5

## 2014-03-05 MED ORDER — ATORVASTATIN CALCIUM 10 MG PO TABS
10.0000 mg | ORAL_TABLET | Freq: Every day | ORAL | Status: DC
  Administered 2014-03-05 – 2014-03-06 (×2): 10 mg via ORAL
  Filled 2014-03-05 (×2): qty 1

## 2014-03-05 MED ORDER — GLYCOPYRROLATE 0.2 MG/ML IJ SOLN
INTRAMUSCULAR | Status: AC
  Filled 2014-03-05: qty 3

## 2014-03-05 MED ORDER — DOCUSATE SODIUM 100 MG PO CAPS
100.0000 mg | ORAL_CAPSULE | Freq: Two times a day (BID) | ORAL | Status: DC
  Administered 2014-03-05 – 2014-03-06 (×3): 100 mg via ORAL
  Filled 2014-03-05 (×4): qty 1

## 2014-03-05 MED ORDER — FENTANYL CITRATE 0.05 MG/ML IJ SOLN
INTRAMUSCULAR | Status: DC | PRN
  Administered 2014-03-05: 100 ug via INTRAVENOUS
  Administered 2014-03-05: 50 ug via INTRAVENOUS
  Administered 2014-03-05: 100 ug via INTRAVENOUS

## 2014-03-05 MED ORDER — MENTHOL 3 MG MT LOZG: 1.0000 | LOZENGE | OROMUCOSAL | Status: DC | PRN

## 2014-03-05 MED ORDER — ESMOLOL HCL 10 MG/ML IV SOLN
INTRAVENOUS | Status: DC | PRN
  Administered 2014-03-05: 15 mg via INTRAVENOUS
  Administered 2014-03-05: 20 mg via INTRAVENOUS

## 2014-03-05 MED ORDER — GLYCOPYRROLATE 0.2 MG/ML IJ SOLN
INTRAMUSCULAR | Status: DC | PRN
  Administered 2014-03-05: 0.2 mg via INTRAVENOUS
  Administered 2014-03-05: 0.6 mg via INTRAVENOUS

## 2014-03-05 MED ORDER — ESCITALOPRAM OXALATE 10 MG PO TABS
10.0000 mg | ORAL_TABLET | Freq: Every day | ORAL | Status: DC
  Administered 2014-03-05 – 2014-03-06 (×2): 10 mg via ORAL
  Filled 2014-03-05 (×2): qty 1

## 2014-03-05 MED ORDER — ASPIRIN EC 325 MG PO TBEC
325.0000 mg | DELAYED_RELEASE_TABLET | Freq: Two times a day (BID) | ORAL | Status: DC
  Administered 2014-03-05 – 2014-03-06 (×2): 325 mg via ORAL
  Filled 2014-03-05 (×4): qty 1

## 2014-03-05 MED ORDER — PHENYLEPHRINE HCL 10 MG/ML IJ SOLN
INTRAMUSCULAR | Status: DC | PRN
  Administered 2014-03-05: 120 ug via INTRAVENOUS

## 2014-03-05 MED ORDER — ACETAMINOPHEN 325 MG PO TABS
650.0000 mg | ORAL_TABLET | Freq: Four times a day (QID) | ORAL | Status: DC | PRN
Start: 2014-03-05 — End: 2014-03-06

## 2014-03-05 MED ORDER — FLEET ENEMA 7-19 GM/118ML RE ENEM: 1.0000 | ENEMA | Freq: Once | RECTAL | Status: AC | PRN

## 2014-03-05 MED ORDER — NEOSTIGMINE METHYLSULFATE 10 MG/10ML IV SOLN
INTRAVENOUS | Status: DC | PRN
  Administered 2014-03-05: 4 mg via INTRAVENOUS

## 2014-03-05 MED ORDER — THROMBIN 20000 UNITS EX SOLR
CUTANEOUS | Status: AC
Start: 2014-03-05 — End: 2014-03-05
  Filled 2014-03-05: qty 20000

## 2014-03-05 MED ORDER — LACTATED RINGERS IV SOLN
INTRAVENOUS | Status: DC | PRN
  Administered 2014-03-05 (×3): via INTRAVENOUS

## 2014-03-05 MED ORDER — LEVOCETIRIZINE DIHYDROCHLORIDE 5 MG PO TABS: 5.0000 mg | ORAL_TABLET | Freq: Every evening | ORAL | Status: DC

## 2014-03-05 MED ORDER — NEOSTIGMINE METHYLSULFATE 10 MG/10ML IV SOLN
INTRAVENOUS | Status: AC
  Filled 2014-03-05: qty 1

## 2014-03-05 MED ORDER — ESMOLOL HCL 10 MG/ML IV SOLN
INTRAVENOUS | Status: AC
  Filled 2014-03-05: qty 10

## 2014-03-05 MED ORDER — SODIUM CHLORIDE 0.9 % IJ SOLN
INTRAMUSCULAR | Status: AC
Start: 2014-03-05 — End: 2014-03-05
  Filled 2014-03-05: qty 10

## 2014-03-05 MED ORDER — ACETAMINOPHEN 650 MG RE SUPP
650.0000 mg | Freq: Four times a day (QID) | RECTAL | Status: DC | PRN
Start: 2014-03-05 — End: 2014-03-06

## 2014-03-05 MED ORDER — HEMOSTATIC AGENTS (NO CHARGE) OPTIME
TOPICAL | Status: DC | PRN
  Administered 2014-03-05: 1 via TOPICAL

## 2014-03-05 MED ORDER — FENTANYL CITRATE 0.05 MG/ML IJ SOLN
INTRAMUSCULAR | Status: AC
  Filled 2014-03-05: qty 5

## 2014-03-05 MED ORDER — ATROPINE SULFATE 0.1 MG/ML IJ SOLN
INTRAMUSCULAR | Status: AC
  Filled 2014-03-05: qty 10

## 2014-03-05 MED ORDER — LABETALOL HCL 5 MG/ML IV SOLN
INTRAVENOUS | Status: DC | PRN
  Administered 2014-03-05 (×3): 5 mg via INTRAVENOUS

## 2014-03-05 MED ORDER — CEFAZOLIN SODIUM-DEXTROSE 2-3 GM-% IV SOLR
2.0000 g | Freq: Four times a day (QID) | INTRAVENOUS | Status: AC
  Administered 2014-03-05 – 2014-03-06 (×3): 2 g via INTRAVENOUS
  Filled 2014-03-05 (×4): qty 50

## 2014-03-05 MED ORDER — LIDOCAINE HCL (CARDIAC) 20 MG/ML IV SOLN
INTRAVENOUS | Status: DC | PRN
  Administered 2014-03-05: 50 mg via INTRAVENOUS

## 2014-03-05 MED ORDER — ALUMINUM HYDROXIDE GEL 320 MG/5ML PO SUSP
15.0000 mL | ORAL | Status: DC | PRN
  Filled 2014-03-05: qty 30

## 2014-03-05 MED ORDER — POLYETHYLENE GLYCOL 3350 17 G PO PACK: 17.0000 g | PACK | Freq: Every day | ORAL | Status: DC | PRN

## 2014-03-05 MED ORDER — LIDOCAINE HCL (CARDIAC) 20 MG/ML IV SOLN
INTRAVENOUS | Status: AC
  Filled 2014-03-05: qty 5

## 2014-03-05 MED ORDER — PROPOFOL 10 MG/ML IV BOLUS
INTRAVENOUS | Status: DC | PRN
Start: 2014-03-05 — End: 2014-03-05
  Administered 2014-03-05: 150 mg via INTRAVENOUS

## 2014-03-05 MED ORDER — MORPHINE SULFATE 2 MG/ML IJ SOLN: 1.0000 mg | INTRAMUSCULAR | Status: DC | PRN

## 2014-03-05 MED ORDER — LORATADINE 10 MG PO TABS
10.0000 mg | ORAL_TABLET | Freq: Every evening | ORAL | Status: DC
  Administered 2014-03-05 – 2014-03-06 (×2): 10 mg via ORAL
  Filled 2014-03-05 (×2): qty 1

## 2014-03-05 MED ORDER — METOCLOPRAMIDE HCL 10 MG PO TABS: 5.0000 mg | ORAL_TABLET | Freq: Three times a day (TID) | ORAL | Status: DC | PRN

## 2014-03-05 MED ORDER — PHENOL 1.4 % MT LIQD: 1.0000 | OROMUCOSAL | Status: DC | PRN

## 2014-03-05 MED ORDER — ONDANSETRON HCL 4 MG PO TABS
4.0000 mg | ORAL_TABLET | Freq: Four times a day (QID) | ORAL | Status: DC | PRN
  Filled 2014-03-05: qty 1

## 2014-03-05 MED ORDER — METHOCARBAMOL 500 MG PO TABS
500.0000 mg | ORAL_TABLET | Freq: Four times a day (QID) | ORAL | Status: DC | PRN
  Administered 2014-03-06: 500 mg via ORAL
  Filled 2014-03-05 (×2): qty 1

## 2014-03-05 MED ORDER — PHENYLEPHRINE HCL 10 MG/ML IJ SOLN
10.0000 mg | INTRAVENOUS | Status: DC | PRN
  Administered 2014-03-05: 40 ug/min via INTRAVENOUS

## 2014-03-05 MED ORDER — FENTANYL CITRATE 0.05 MG/ML IJ SOLN: 25.0000 ug | INTRAMUSCULAR | Status: DC | PRN

## 2014-03-05 MED ORDER — SODIUM CHLORIDE 0.9 % IR SOLN
Status: DC | PRN
  Administered 2014-03-05: 1000 mL
  Administered 2014-03-05: 3000 mL

## 2014-03-05 MED ORDER — EPHEDRINE SULFATE 50 MG/ML IJ SOLN
INTRAMUSCULAR | Status: DC | PRN
  Administered 2014-03-05: 20 mg via INTRAVENOUS
  Administered 2014-03-05: 15 mg via INTRAVENOUS
  Administered 2014-03-05: 10 mg via INTRAVENOUS

## 2014-03-05 MED ORDER — BUPIVACAINE-EPINEPHRINE (PF) 0.5% -1:200000 IJ SOLN
INTRAMUSCULAR | Status: DC | PRN
  Administered 2014-03-05: 25 mL via PERINEURAL

## 2014-03-05 MED ORDER — BUPIVACAINE LIPOSOME 1.3 % IJ SUSP
INTRAMUSCULAR | Status: DC | PRN
  Administered 2014-03-05: 20 mL

## 2014-03-05 MED ORDER — MIDAZOLAM HCL 2 MG/2ML IJ SOLN
INTRAMUSCULAR | Status: AC
Start: 2014-03-05 — End: 2014-03-05
  Filled 2014-03-05: qty 2

## 2014-03-05 MED ORDER — FLUTICASONE PROPIONATE 50 MCG/ACT NA SUSP
2.0000 | Freq: Two times a day (BID) | NASAL | Status: DC | PRN
  Filled 2014-03-05: qty 16

## 2014-03-05 MED ORDER — SODIUM CHLORIDE 0.9 % IJ SOLN
INTRAMUSCULAR | Status: DC | PRN
  Administered 2014-03-05: 20 mL

## 2014-03-05 MED ORDER — PANTOPRAZOLE SODIUM 40 MG PO TBEC
80.0000 mg | DELAYED_RELEASE_TABLET | Freq: Every day | ORAL | Status: DC
  Administered 2014-03-06: 80 mg via ORAL
  Filled 2014-03-05 (×2): qty 2

## 2014-03-05 MED ORDER — MEPERIDINE HCL 25 MG/ML IJ SOLN: 6.2500 mg | INTRAMUSCULAR | Status: DC | PRN

## 2014-03-05 MED ORDER — ROCURONIUM BROMIDE 50 MG/5ML IV SOLN
INTRAVENOUS | Status: AC
Start: 2014-03-05 — End: 2014-03-05
  Filled 2014-03-05: qty 1

## 2014-03-05 MED ORDER — PROMETHAZINE HCL 25 MG/ML IJ SOLN: 6.2500 mg | INTRAMUSCULAR | Status: DC | PRN

## 2014-03-05 MED ORDER — POVIDONE-IODINE 7.5 % EX SOLN
Freq: Once | CUTANEOUS | Status: DC
  Filled 2014-03-05: qty 118

## 2014-03-05 SURGICAL SUPPLY — 71 items
Affiniti Cortiloc pegged glenoid (Shoulder) ×3 IMPLANT
BIT DRILL 5/64X5 DISP (BIT) ×3 IMPLANT
BLADE SAW SAG 73X25 THK (BLADE) ×2
BLADE SAW SGTL 73X25 THK (BLADE) ×1 IMPLANT
BLADE SAW SGTL 81X20 HD (BLADE) ×3 IMPLANT
BLADE SURG 15 STRL LF DISP TIS (BLADE) ×1 IMPLANT
BLADE SURG 15 STRL SS (BLADE) ×2
CEMENT BONE DEPUY (Cement) ×3 IMPLANT
CHLORAPREP W/TINT 26ML (MISCELLANEOUS) ×3 IMPLANT
CLOSURE WOUND 1/2 X4 (GAUZE/BANDAGES/DRESSINGS) ×1
COVER MAYO STAND STRL (DRAPES) IMPLANT
COVER SURGICAL LIGHT HANDLE (MISCELLANEOUS) ×3 IMPLANT
DRAPE IMP U-DRAPE 54X76 (DRAPES) ×3 IMPLANT
DRAPE INCISE IOBAN 66X45 STRL (DRAPES) ×6 IMPLANT
DRAPE ORTHO SPLIT 77X108 STRL (DRAPES) ×4
DRAPE SURG 17X23 STRL (DRAPES) ×3 IMPLANT
DRAPE SURG ORHT 6 SPLT 77X108 (DRAPES) ×2 IMPLANT
DRAPE U-SHAPE 47X51 STRL (DRAPES) ×3 IMPLANT
DRESSING AQUACEL AQ EXTRA 4X5 (GAUZE/BANDAGES/DRESSINGS) ×3 IMPLANT
DRSG AQUACEL AG ADV 3.5X10 (GAUZE/BANDAGES/DRESSINGS) IMPLANT
ELECT BLADE 4.0 EZ CLEAN MEGAD (MISCELLANEOUS)
ELECT REM PT RETURN 9FT ADLT (ELECTROSURGICAL) ×3
ELECTRODE BLDE 4.0 EZ CLN MEGD (MISCELLANEOUS) IMPLANT
ELECTRODE REM PT RTRN 9FT ADLT (ELECTROSURGICAL) ×1 IMPLANT
EVACUATOR 1/8 PVC DRAIN (DRAIN) IMPLANT
GLOVE BIO SURGEON STRL SZ7 (GLOVE) ×9 IMPLANT
GLOVE BIO SURGEON STRL SZ7.5 (GLOVE) ×6 IMPLANT
GLOVE BIOGEL PI IND STRL 7.0 (GLOVE) ×3 IMPLANT
GLOVE BIOGEL PI IND STRL 8 (GLOVE) ×1 IMPLANT
GLOVE BIOGEL PI INDICATOR 7.0 (GLOVE) ×6
GLOVE BIOGEL PI INDICATOR 8 (GLOVE) ×2
GOWN STRL REUS W/ TWL LRG LVL3 (GOWN DISPOSABLE) ×1 IMPLANT
GOWN STRL REUS W/ TWL XL LVL3 (GOWN DISPOSABLE) ×1 IMPLANT
GOWN STRL REUS W/TWL LRG LVL3 (GOWN DISPOSABLE) ×3
GOWN STRL REUS W/TWL XL LVL3 (GOWN DISPOSABLE) ×2
HANDPIECE INTERPULSE COAX TIP (DISPOSABLE) ×3
HEMOSTAT SURGICEL 2X14 (HEMOSTASIS) ×3 IMPLANT
HOOD PEEL AWAY FACE SHEILD DIS (HOOD) ×6 IMPLANT
HUMERAL HEAD 8 ×3 IMPLANT
HUMERAL STEM 7B ×3 IMPLANT
KIT BASIN OR (CUSTOM PROCEDURE TRAY) ×3 IMPLANT
KIT ROOM TURNOVER OR (KITS) ×3 IMPLANT
MANIFOLD NEPTUNE II (INSTRUMENTS) ×3 IMPLANT
NEEDLE HYPO 25GX1X1/2 BEV (NEEDLE) ×3 IMPLANT
NEEDLE MAYO TROCAR (NEEDLE) ×3 IMPLANT
NS IRRIG 1000ML POUR BTL (IV SOLUTION) ×3 IMPLANT
PACK SHOULDER (CUSTOM PROCEDURE TRAY) ×3 IMPLANT
PAD ARMBOARD 7.5X6 YLW CONV (MISCELLANEOUS) ×3 IMPLANT
PIN GUIDE 2.5X200 (PIN) ×3 IMPLANT
RETRIEVER SUT HEWSON (MISCELLANEOUS) ×3 IMPLANT
SET HNDPC FAN SPRY TIP SCT (DISPOSABLE) ×1 IMPLANT
SLING ARM IMMOBILIZER LRG (SOFTGOODS) ×3 IMPLANT
SLING ARM IMMOBILIZER MED (SOFTGOODS) IMPLANT
SMARTMIX MINI TOWER (MISCELLANEOUS) ×3
SPONGE LAP 18X18 X RAY DECT (DISPOSABLE) ×3 IMPLANT
SPONGE LAP 4X18 X RAY DECT (DISPOSABLE) ×3 IMPLANT
STRIP CLOSURE SKIN 1/2X4 (GAUZE/BANDAGES/DRESSINGS) ×2 IMPLANT
SUCTION FRAZIER TIP 10 FR DISP (SUCTIONS) ×3 IMPLANT
SUPPORT WRAP ARM LG (MISCELLANEOUS) ×3 IMPLANT
SUT ETHIBOND NAB CT1 #1 30IN (SUTURE) ×12 IMPLANT
SUT MNCRL AB 4-0 PS2 18 (SUTURE) ×3 IMPLANT
SUT SILK 2 0 TIES 17X18 (SUTURE)
SUT SILK 2-0 18XBRD TIE BLK (SUTURE) IMPLANT
SUT VIC AB 0 CTB1 27 (SUTURE) ×3 IMPLANT
SUT VIC AB 2-0 CT1 27 (SUTURE) ×3
SUT VIC AB 2-0 CT1 TAPERPNT 27 (SUTURE) ×1 IMPLANT
SYR CONTROL 10ML LL (SYRINGE) IMPLANT
TAPE FIBER 2MM 7IN #2 BLUE (SUTURE) ×9 IMPLANT
TOWEL OR 17X24 6PK STRL BLUE (TOWEL DISPOSABLE) ×3 IMPLANT
TOWEL OR 17X26 10 PK STRL BLUE (TOWEL DISPOSABLE) ×3 IMPLANT
TOWER SMARTMIX MINI (MISCELLANEOUS) ×1 IMPLANT

## 2014-03-05 NOTE — Progress Notes (Signed)
Utilization review completed.  

## 2014-03-05 NOTE — Anesthesia Postprocedure Evaluation (Signed)
  Anesthesia Post-op Note  Patient: Corey Adams.  Procedure(s) Performed: Procedure(s) with comments: TOTAL SHOULDER ARTHROPLASTY (Right) - Right shoulder replacement, distal clavicle excision  Patient Location: PACU  Anesthesia Type:General  Level of Consciousness: awake and alert   Airway and Oxygen Therapy: Patient Spontanous Breathing and Patient connected to nasal cannula oxygen  Post-op Pain: mild  Post-op Assessment: Post-op Vital signs reviewed, Patient's Cardiovascular Status Stable, Respiratory Function Stable, Patent Airway and No signs of Nausea or vomiting  Post-op Vital Signs: Reviewed and stable  Last Vitals:  Filed Vitals:   03/05/14 1055  BP: 103/70  Pulse: 78  Temp: 36.7 C  Resp: 12    Complications: No apparent anesthesia complications

## 2014-03-05 NOTE — H&P (Signed)
Corey Adams. is an 68 y.o. male.   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  Past Medical History  Diagnosis Date  . Hypertension   . Hyperlipidemia   . Allergy   . GERD (gastroesophageal reflux disease)   . Depression   . Anxiety   . Migraine   . Actinic keratitis   . Complication of anesthesia     difficulty urinating after anesthesia  . Sleep apnea     last sleep study done 6 yrs ago  . Chronic kidney disease     hx. kidney stones  . Arthritis     thumb  . Asthma     no asthma attack in over 30 yrs    Past Surgical History  Procedure Laterality Date  . Excisional hemorrhoidectomy    . Bilateral carp tunnel release    . Nasal sinus surgery    . Vascectomy    . Laparoscopic low anterior resection N/A 10/30/2012    Procedure: LAPAROSCOPIC LOW ANTERIOR RESECTION with Rigid Proctoscopy;  Surgeon: Merrie Roof, MD;  Location: [facility] OR;  Service: General;  Laterality: N/A;    Family History  Problem Relation Age of Onset  . Heart attack Mother   . Hypercholesterolemia Sister   . Heart disease Father   . Heart attack Father    Social History:  reports that he has never smoked. He has never used smokeless tobacco. He reports that he does not drink alcohol or use illicit drugs.  Allergies:  Allergies  Allergen Reactions  . Shellfish Allergy Anaphylaxis  . Codeine Nausea And Vomiting  . Guar Gum Hives  . Xanthan Gum Hives    Medications Prior to Admission  Medication Sig Dispense Refill  . aspirin EC 325 MG tablet Take 325 mg by mouth every 6 (six) hours as needed (pain).     Marland Kitchen atorvastatin (LIPITOR) 10 MG tablet Take 10 mg by mouth daily.    . bisoprolol-hydrochlorothiazide (ZIAC) 2.5-6.25 MG per tablet Take 1 tablet by mouth daily.    Marland Kitchen escitalopram (LEXAPRO) 10 MG tablet Take 10 mg by mouth daily.    Marland Kitchen levocetirizine (XYZAL) 5 MG tablet Take 5 mg by mouth  every evening.    . mometasone (NASONEX) 50 MCG/ACT nasal spray Place 2 sprays into the nose 2 (two) times daily as needed (congestion).     Marland Kitchen omeprazole (PRILOSEC) 40 MG capsule Take 40 mg by mouth daily.     Marland Kitchen doxycycline (VIBRA-TABS) 100 MG tablet Take 1 tablet (100 mg total) by mouth 2 (two) times daily. (Patient not taking: Reported on 02/19/2014) 14 tablet 0  . doxycycline (VIBRA-TABS) 100 MG tablet Take 1 tablet (100 mg total) by mouth 2 (two) times daily. (Patient not taking: Reported on 02/19/2014) 14 tablet 2    No results found for this or any previous visit (from the past 48 hour(s)). No results found.  Review of Systems  All other systems reviewed and are negative.   Blood pressure 117/67, pulse 55, temperature 98.4 F (36.9 C), temperature source Oral, resp. rate 18, weight 88.905 kg (196 lb), SpO2 97 %. Physical Exam  Constitutional: He is oriented to person, place, and time. He appears well-developed and well-nourished.  HENT:  Head: Atraumatic.  Eyes: EOM are normal.  Cardiovascular: Intact distal pulses.   Respiratory: Effort normal.  Musculoskeletal:  R shoulder pain with limited ROM.  Neurological: He is alert  and oriented to person, place, and time.  Skin: Skin is warm and dry.  Psychiatric: He has a normal mood and affect.     Assessment/Plan R endstage osteoarthritis Plan R total shoulder replacement Risks / benefits of surgery discussed Consent on chart  NPO for OR Preop antibiotics   Corey Adams 03/05/2014, 7:03 AM

## 2014-03-05 NOTE — Anesthesia Procedure Notes (Signed)
Anesthesia Regional Block:  Interscalene brachial plexus block  Pre-Anesthetic Checklist: ,, timeout performed, Correct Patient, Correct Site, Correct Laterality, Correct Procedure, Correct Position, site marked, Risks and benefits discussed,  Surgical consent,  Pre-op evaluation,  At surgeon's request and post-op pain management  Laterality: Right  Prep: Maximum Sterile Barrier Precautions used and chloraprep       Needles:  Injection technique: Single-shot  Needle Type: Echogenic Stimulator Needle     Needle Length: 10cm 10 cm Needle Gauge: 22 and 22 G    Additional Needles:  Procedures: ultrasound guided (picture in chart) Interscalene brachial plexus block  Nerve Stimulator or Paresthesia:  Response: 0.5 mA,   Additional Responses:   Narrative:  Start time: 03/05/2014 6:54 AM End time: 03/05/2014 7:06 AM Injection made incrementally with aspirations every 5 mL. Anesthesiologist: Alexis Frock  Additional Notes: R IS block, [place], Stimulator, .5% marcaine with epi, multiple asp, talked throughout procedure, no complications

## 2014-03-05 NOTE — Transfer of Care (Signed)
Immediate Anesthesia Transfer of Care Note  Patient: Corey Adams.  Procedure(s) Performed: Procedure(s) with comments: TOTAL SHOULDER ARTHROPLASTY (Right) - Right shoulder replacement, distal clavicle excision  Patient Location: PACU  Anesthesia Type:General  Level of Consciousness: awake, alert  and oriented  Airway & Oxygen Therapy: Patient Spontanous Breathing and Patient connected to nasal cannula oxygen  Post-op Assessment: Report given to PACU RN and Post -op Vital signs reviewed and stable  Post vital signs: Reviewed and stable  Complications: No apparent anesthesia complications

## 2014-03-05 NOTE — Op Note (Signed)
Procedure(s): TOTAL SHOULDER ARTHROPLASTY Procedure Note  Corey Adams. male 68 y.o. 03/05/2014  Procedure(s) and Anesthesia Type:   #1 RIGHT TOTAL SHOULDER ARTHROPLASTY - Choice  #2 RIGHT DISTAL CLAVICLE EXCISION  Surgeon(s) and Role:    * Nita Sells, MD - Primary   Indications:  68 y.o. male  With endstage right shoulder arthritis. Pain and dysfunction interfered with quality of life and nonoperative treatment with activity modification, NSAIDS failed.     Surgeon: Nita Sells   Assistants: Jeanmarie Hubert PA-C [facility] was present and scrubbed throughout the procedure and was essential in positioning, retraction, exposure, and closure)  Anesthesia: General endotracheal anesthesia with preoperative interscalene block given by the attending anesthesiologist     Procedure Detail  TOTAL SHOULDER ARTHROPLASTY  Findings: Tornier ascend size 7 B stem with a 50 low eccentric head and a 52 cortilock glenoid. A lesser tuberosity osteotomy was performed and repaired the conclusion of the procedure.  Estimated Blood Loss:  300 mL         Drains: None  Blood Given: none          Specimens: none        Complications:  * No complications entered in OR log *         Disposition: PACU - hemodynamically stable.         Condition: stable    Procedure:   The patient was identified in the preoperative holding area where I personally marked the operative extremity after verifying with the patient and consent. He  was taken to the operating room where He was transferred to the   operative table.  The patient received an interscalene block in   the holding area by the attending anesthesiologist.  General anesthesia was induced   in the operating room without complication.  The patient did receive IV  Ancef prior to the commencement of the procedure.  The patient was   placed in the beach-chair position with the back raised about 30   degrees.   The nonoperative extremity and head and neck were carefully   positioned and padded protecting against neurovascular compromise.  The   left upper extremity was then prepped and draped in the standard sterile   fashion.    The appropriate operative time-out was performed with   Anesthesia, the perioperative staff, as well as myself and we all agreed   that the right side was the correct operative site.  An approximately   12 cm incision was made from the tip of the coracoid to the center point of the   humerus at the level of the axilla.  Dissection was carried down sharply   through subcutaneous tissues and cephalic vein was identified and taken   laterally with the deltoid.  The pectoralis major was taken medially.  The   upper 1 cm of the pectoralis major was released from its attachment on   the humerus.  The clavipectoral fascia was incised just lateral to the   conjoined tendon.  This incision was carried up to but not into the   coracoacromial ligament.  Digital palpation was used to prove   integrity of the axillary nerve which was protected throughout the   procedure.  Musculocutaneous nerve was not palpated in the operative   field.  Conjoined tendon was then retracted gently medially and the   deltoid laterally.  Anterior circumflex humeral vessels were clamped and   coagulated.  The soft tissues overlying the biceps  was incised and this   incision was carried across the transverse humeral ligament to the base   of the coracoid.  The biceps was tenodesed to the soft tissue just above   pectoralis major and the remaining portion of the biceps superiorly was   excised.  An osteotomy was performed at the lesser tuberosity and the   subscapularis was freed from the underlying capsule.  Capsule was then   released all the way down to the 6 o'clock position of the humeral head.   The humeral head was then delivered with simultaneous adduction,   extension and external rotation.  All  humeral osteophytes were removed   and the anatomic neck of the humerus was marked and cut free hand at   approximately 25 degrees retroversion within about 3 mm of the cuff   reflection posteriorly.  The head size was estimated to be a 50 medium   offset.  At that point, the humeral head was retracted posteriorly with   a Fukuda retractor and the anterior-inferior capsule was excised.   Remaining portion of the capsule was released at the base of the   coracoid.  The remaining biceps anchor and the entire anterior-inferior   labrum was excised.  The posterior labrum was also excised but the   posterior capsule was not released.  The guidepin was placed bicortically with +0 elevated guide.  The reamer was used to ream to concentric bone with punctate bleeding.  This gave an excellent concentric surface.  The center hole was then drilled for an anchor peg glenoid followed by the three peripheral holes and none of the holes   exited the glenoid wall.  I then pulse irrigated these holes and dried   them with Surgicel.  The three peripheral holes were then   pressurized cemented and the anchor peg glenoid was placed and impacted   with an excellent fit.  The glenoid was a 52 component.  The proximal humerus was then again exposed taking care not to displace the glenoid. The canal finder was used followed by the sounders. The estimated size was a 7. The broaches were then sequentially used from 3-7 in the 7 had an excellent press-fit. The angle was measured to be a size B. The trial was placed with the 7B, 50 low eccentric head. This nicely reproduce the anatomy. The trial was reduced.    With the trial implantation of the component, there was   approximately 50% posterior translation with immediate snap back to the   anatomic position.  With forward elevation, there was no tendency   towards posterior subluxation.   The trial was removed and the final implant was prepared on a back table.   Small  holes were drilled on both sides of the lesser tuberosity osteotomy, through which 3 Fibertapes were passed. The implant was then placed through the loop of all 3 Fibertapes and impacted with an excellent press-fit. This achieved excellent anatomic reconstruction of the proximal humerus.  The joint was then copiously irrigated with pulse lavage.  The subscapularis and   lesser tuberosity osteotomy were then repaired using the 3 Fibertapes previously passed.   One #1 Ethibond was placed at the rotator interval just above   the lesser tuberosity. Copious irrigation was used.  Skin flaps were then raised proximally to the level of the before meals joint. The deltotrapezial fascia was opened longitudinally over the before meals joint and the distal clavicle was exposed for about 15 mm.  The distal 10 mm was resected sharply with an oscillating saw. This created a nice gap with no further impingement.  The distal clavicle was severely arthritic. 2 #2 Ethibonds were then passed through the bone dorsally and used to secure the deltotrapezial fascia back to the superior aspect of the distal clavicle section. Remainder of the fascia was then closed with Ethibond. Copious irrigation was again used.   Experel was used to infiltrate the subcutaneous tissues and the deep joint with a mixture of 20 cc Experel and 20 cc normal saline. Skin was closed with 2-0 Vicryl sutures in the deep dermal layer and 4-0 Monocryl in a subcuticular  running fashion.  Sterile dressings were then applied including Aquacel.  The patient was placed in a sling and allowed to awaken from general anesthesia and taken to the recovery room in stable condition.      POSTOPERATIVE PLAN:  Early passive range of motion will be allowed with the goal of 40 degrees external rotation and 140 degrees forward elevation.  No internal rotation at this time.  No active motion of the arm until the lesser tuberosity heals.  The patient will likely be kept in  the hospital for 1-2 days and then discharged home.

## 2014-03-06 ENCOUNTER — Encounter (HOSPITAL_COMMUNITY): Payer: Self-pay | Admitting: Orthopedic Surgery

## 2014-03-06 LAB — BASIC METABOLIC PANEL
Anion gap: 12 (ref 5–15)
BUN: 12 mg/dL (ref 6–23)
CHLORIDE: 102 meq/L (ref 96–112)
CO2: 23 mEq/L (ref 19–32)
CREATININE: 1.01 mg/dL (ref 0.50–1.35)
Calcium: 8.2 mg/dL — ABNORMAL LOW (ref 8.4–10.5)
GFR, EST AFRICAN AMERICAN: 87 mL/min — AB (ref >90)
GFR, EST NON AFRICAN AMERICAN: 75 mL/min — AB (ref >90)
Glucose, Bld: 115 mg/dL — ABNORMAL HIGH (ref 70–99)
POTASSIUM: 4.1 meq/L (ref 3.7–5.3)
Sodium: 137 mEq/L (ref 137–147)

## 2014-03-06 LAB — CBC
HCT: 34 % — ABNORMAL LOW (ref 39.0–52.0)
Hemoglobin: 11 g/dL — ABNORMAL LOW (ref 13.0–17.0)
MCH: 29.2 pg (ref 26.0–34.0)
MCHC: 32.4 g/dL (ref 30.0–36.0)
MCV: 90.2 fL (ref 78.0–100.0)
PLATELETS: 131 10*3/uL — AB (ref 150–400)
RBC: 3.77 MIL/uL — ABNORMAL LOW (ref 4.22–5.81)
RDW: 13.4 % (ref 11.5–15.5)
WBC: 5.1 10*3/uL (ref 4.0–10.5)

## 2014-03-06 MED ORDER — OXYCODONE-ACETAMINOPHEN 5-325 MG PO TABS: 1.0000 | ORAL_TABLET | ORAL | Status: DC | PRN

## 2014-03-06 MED ORDER — DOCUSATE SODIUM 100 MG PO CAPS: 100.0000 mg | ORAL_CAPSULE | Freq: Three times a day (TID) | ORAL | Status: DC | PRN

## 2014-03-06 NOTE — Discharge Instructions (Signed)

## 2014-03-06 NOTE — Plan of Care (Signed)
Problem: Phase I Progression Outcomes Goal: OOB as tolerated unless otherwise ordered Outcome: Completed/Met Date Met:  03/06/14 Goal: OT evaluation for ADLs discussed Outcome: Completed/Met Date Met:  03/06/14 Goal: Therapeutic exercise per MD order Outcome: Completed/Met Date Met:  03/06/14 Goal: Discharge plan established Outcome: Completed/Met Date Met:  03/06/14 Goal: Voiding-avoid urinary catheter unless indicated Outcome: Not Applicable Date Met:  66/44/03 Goal: Other Phase I Outcomes/Goals Outcome: Completed/Met Date Met:  03/06/14  Problem: Phase II Progression Outcomes Goal: Barriers To Progression Addressed/Resolved Outcome: Completed/Met Date Met:  03/06/14 Goal: Discharge plan in place and appropriate Outcome: Completed/Met Date Met:  03/06/14 Goal: Pain controlled with appropriate interventions Outcome: Completed/Met Date Met:  03/06/14 Goal: Hemodynamically stable Outcome: Completed/Met Date Met:  47/42/59 Goal: Complications resolved/controlled Outcome: Completed/Met Date Met:  03/06/14 Goal: Tolerating diet Outcome: Completed/Met Date Met:  03/06/14 Goal: Activity appropriate for discharge plan Outcome: Completed/Met Date Met:  03/06/14 Goal: Incision without S/S infection Outcome: Completed/Met Date Met:  03/06/14 Goal: Other Discharge Outcomes/Goals Outcome: Not Applicable Date Met:  56/38/75

## 2014-03-06 NOTE — Progress Notes (Signed)
Occupational Therapy Treatment Patient Details Name: Corey Adams. MRN: [id number] DOB: [birth year] Today's Date: 03/06/2014    History of present illness Corey Adams. is a 68 y.o. Male s/p R TSA on 03/05/14. PMH: HTn, HL, GERD, depression, anxiety, migrain, sleep apnea, CKD, athritis with surgical hx of Bil CTR.    OT comments  Pt seen this afternoon for ADL session. Pt ambulating in room when OT arrived and assisted in dressing. Pt requires min (A) for UB ADLs and otherwise Mod Independent. Pt plans to d/c this afternoon pending successful voiding (urination). No further acute OT needs.    Follow Up Recommendations  No OT follow up;Supervision - Intermittent    Equipment Recommendations  None recommended by OT    Recommendations for Other Services      Precautions / Restrictions Precautions Precautions: Shoulder;Fall Type of Shoulder Precautions: Chandler Protocol (supine FF to 140*, ER with stick to 40* AAROM) Shoulder Interventions: Shoulder sling/immobilizer;Off for dressing/bathing/exercises Precaution Comments: Educated pt and wife on shoulder precautions and incorporating into ADLs.  Required Braces or Orthoses: Sling Restrictions Weight Bearing Restrictions: Yes RUE Weight Bearing: Non weight bearing       Mobility Bed Mobility Overal bed mobility: Independent                Transfers Overall transfer level: Independent                        ADL Overall ADL's : Needs assistance/impaired     Grooming: Modified independent;Standing   Upper Body Bathing: Minimal assitance;Sitting       Upper Body Dressing : Minimal assistance;Sitting Upper Body Dressing Details (indicate cue type and reason): including sling     Toilet Transfer: Modified Independent             General ADL Comments: Pt ambulating in room when OT arrived. Assisted pt in dressing to prepare for d/c. Pt required min (A) for UB ADLs. Pt reported he has  completed therapeutic exercises x2 today.                 Cognition  Arousal/Alertness: Awake/Alert Behavior During Therapy: WFL for tasks assessed/performed Overall Cognitive Status: Within Functional Limits for tasks assessed                         Exercises Donning/doffing shirt without moving shoulder: Minimal assistance Method for sponge bathing under operated UE: Minimal assistance Donning/doffing sling/immobilizer: Minimal assistance Correct positioning of sling/immobilizer: Minimal assistance ROM for elbow, wrist and digits of operated UE: Independent Sling wearing schedule (on at all times/off for ADL's): Independent Proper positioning of operated UE when showering: Independent Positioning of UE while sleeping: Independent   Shoulder Instructions Shoulder Instructions Donning/doffing shirt without moving shoulder: Minimal assistance Method for sponge bathing under operated UE: Minimal assistance Donning/doffing sling/immobilizer: Minimal assistance Correct positioning of sling/immobilizer: Minimal assistance ROM for elbow, wrist and digits of operated UE: Independent Sling wearing schedule (on at all times/off for ADL's): Independent Proper positioning of operated UE when showering: Independent Positioning of UE while sleeping: Independent     General Comments      Pertinent Vitals/ Pain       Pain Assessment: No/denies pain         Frequency       Progress Toward Goals  OT Goals(current goals can now be found in the care plan section)  Progress towards OT goals: Goals  met/education completed, patient discharged from [place] goals met and education completed, patient discharged from [place] During Treatment: Other (comment) (sling)   Activity Tolerance Patient tolerated treatment well   Patient Left with call bell/phone within reach;in bed   Nurse Communication          Time:  1550-2714 OT Time Calculation (min): 10 min  Charges: OT General Charges $OT Visit: 1 Procedure OT Treatments $Self Care/Home Management : 8-22 mins  Villa Herb M 03/06/2014, 2:50 PM  Cyndie Chime, OTR/L Occupational Therapist [phone] (pager)

## 2014-03-06 NOTE — Plan of Care (Signed)
Problem: Consults Goal: Shoulder Surgery Patient Education See Patient Education Module for education specifics.  Outcome: Completed/Met Date Met:  03/06/14 Goal: Diagnosis - Shoulder Surgery Total Shoulder Arthroplasty Right  Problem: Phase I Progression Outcomes Goal: Pain controlled with appropriate interventions Outcome: Completed/Met Date Met:  03/06/14 Goal: Clear liquids, advance diet as tolerated Outcome: Completed/Met Date Met:  03/06/14 Goal: Post op CNS neurovascular WDL Outcome: Completed/Met Date Met:  03/06/14 Goal: Hemodynamically stable Outcome: Completed/Met Date Met:  03/06/14

## 2014-03-06 NOTE — Progress Notes (Signed)
Patient ready for discharge. Instructions reviewed and patient verbalizes understanding.

## 2014-03-06 NOTE — Progress Notes (Signed)
Occupational Therapy Evaluation Patient Details Name: Corey Adams. MRN: [id number] DOB: October 06, 1945 Today's Date: 03/06/2014    History of Present Illness Jamal Collin. Nemiah Kissner. is a 68 y.o. Male s/p R TSA on 03/05/14. PMH: HTn, HL, GERD, depression, anxiety, migrain, sleep apnea, CKD, athritis with surgical hx of Bil CTR.    Clinical Impression   PTA pt lived at home with his wife and was independent with ADLs. Pt currently limited in ADLs by pain and decreased ROM of Right, dominant, UE. Pt declined OOB at this time and would like to defer until foley and IV removed to practice ADLs and functional mobility. OT will plan to return to complete today.      Follow Up Recommendations  No OT follow up;Supervision - Intermittent    Equipment Recommendations  None recommended by OT    Recommendations for Other Services       Precautions / Restrictions Precautions Precautions: Shoulder;Fall Type of Shoulder Precautions: Chandler Protocol (supine FF to 140*, ER with stick to 40* AAROM) Shoulder Interventions: Shoulder sling/immobilizer;Off for dressing/bathing/exercises Precaution Booklet Issued: Yes (comment) Precaution Comments: Educated pt and wife on shoulder precautions and incorporating into ADLs.  Required Braces or Orthoses: Sling Restrictions Weight Bearing Restrictions: Yes RUE Weight Bearing: Non weight bearing      Mobility Bed Mobility               General bed mobility comments: Deferred at this time.   Transfers                 General transfer comment: Deferred at this time         ADL Overall ADL's : Needs assistance/impaired Eating/Feeding: Set up;Sitting Eating/Feeding Details (indicate cue type and reason): requires (A) for opening containers and cutting food Grooming: Set up;Sitting   Upper Body Bathing: Minimal assitance;Sitting       Upper Body Dressing : Maximal assistance;Sitting Upper Body Dressing Details (indicate cue type  and reason): including sling                   General ADL Comments: Pt in bed and requested to defer OOB and getting dressed until foley and IV are removed. OT will plan to return to practice UB ADLs and assess functional mobility later this date.      Vision  Pt reports no change from baseline.                    Perception Perception Perception Tested?: No   Praxis Praxis Praxis tested?: Within functional limits    Pertinent Vitals/Pain Pain Assessment: 0-10 Pain Score: 5  Pain Location: R shoulder Pain Descriptors / Indicators: Aching;Shooting;Sore Pain Intervention(s): Limited activity within patient's tolerance;Monitored during session;Repositioned;Ice applied;Premedicated before session     Hand Dominance Right   Extremity/Trunk Assessment Upper Extremity Assessment Upper Extremity Assessment: RUE deficits/detail RUE Deficits / Details: R TSA; Pt able to achieve ~ 40* FF, "neutral" ER RUE: Unable to fully assess due to pain;Unable to fully assess due to immobilization RUE Coordination: decreased gross motor   Lower Extremity Assessment Lower Extremity Assessment: Overall WFL for tasks assessed   Cervical / Trunk Assessment Cervical / Trunk Assessment: Normal   Communication Communication Communication: No difficulties   Cognition Arousal/Alertness: Awake/alert Behavior During Therapy: WFL for tasks assessed/performed Overall Cognitive Status: Within Functional Limits for tasks assessed  Exercises Exercises: Shoulder     03/06/14 1000  Shoulder Exercises  Shoulder Flexion AAROM;Right;10 reps;Supine  Shoulder Extension AAROM;Right;10 reps;Supine  Shoulder External Rotation AAROM;Right;10 reps;Supine  Elbow Flexion AROM;Right;10 reps;Supine  Elbow Extension AROM;Right;10 reps;Supine  Wrist Flexion AROM;Right;10 reps;Supine  Wrist Extension AROM;Right;10 reps;Supine  Digit Composite Flexion AROM;Right;10  reps;Supine  Composite Extension AROM;Right;10 reps;Supine      Shoulder Instructions Shoulder Instructions Donning/doffing shirt without moving shoulder: Moderate assistance Method for sponge bathing under operated UE: Minimal assistance Donning/doffing sling/immobilizer: Maximal assistance Correct positioning of sling/immobilizer: Moderate assistance ROM for elbow, wrist and digits of operated UE: Independent Sling wearing schedule (on at all times/off for ADL's): Independent Proper positioning of operated UE when showering: Independent Positioning of UE while sleeping: [place] expects to be discharged to:: Private residence Living Arrangements: Spouse/significant other Available Help at Discharge: Family;Available 24 hours/day Type of Home: House Home Access: Level entry     Home Layout: One level     Bathroom Shower/Tub: Occupational psychologist: Standard     Home Equipment: None          Prior Functioning/Environment Level of Independence: Independent             OT Diagnosis: Generalized weakness;Acute pain   OT Problem List: Decreased strength;Decreased range of motion;Decreased activity tolerance;Decreased knowledge of use of DME or AE;Decreased knowledge of precautions;Impaired UE functional use;Pain   OT Treatment/Interventions: Self-care/ADL training;Therapeutic exercise;Energy conservation;DME and/or AE instruction;Therapeutic activities;Patient/family education;Balance training    OT Goals(Current goals can be found in the care plan section) Acute Rehab OT Goals Patient Stated Goal: to do what I'm supposed to do OT Goal Formulation: With patient Time For Goal Achievement: 03/20/14 Potential to Achieve Goals: Good ADL Goals Pt Will Perform Upper Body Bathing: with set-up;with supervision;sitting Pt Will Perform Upper Body Dressing: with set-up;with supervision;sitting Pt Will Transfer to Toilet: with  modified independence;ambulating Pt/caregiver will Perform Home Exercise Program: Increased ROM;Right Upper extremity;With written HEP provided;Independently  OT Frequency: Min 2X/week    End of Session Equipment Utilized During Treatment: Other (comment) (sling; dowel) Nurse Communication: Other (comment) (OT will return for ADL session)  Activity Tolerance: Patient tolerated treatment well Patient left: in bed;with call bell/phone within reach;with family/visitor present   Time: 0915-0940 OT Time Calculation (min): 25 min Charges:  OT General Charges $OT Visit: 1 Procedure OT Evaluation $Initial OT Evaluation Tier I: 1 Procedure OT Treatments $Therapeutic Exercise: 8-22 mins  Villa Herb M 03/06/2014, 10:31 AM   Cyndie Chime, OTR/L Occupational Therapist [phone] (pager)

## 2014-03-06 NOTE — Progress Notes (Signed)
   PATIENT ID: Corey Adams.   1 Day Post-Op Procedure(s) (LRB): TOTAL SHOULDER ARTHROPLASTY (Right)  Subjective: Doing well, no pain right shoulder. Has foley cath in after difficulty voiding yesterday.  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

## 2014-03-09 NOTE — Discharge Summary (Signed)
Patient ID: Corey Adams. MRN: [id number] DOB/AGE: Jan 08, 1946 68 y.o.  Admit date: 03/05/2014 Discharge date: 03/09/2014  Admission Diagnoses:  Active Problems:   Status post total shoulder arthroplasty   Discharge Diagnoses:  Same  Past Medical History  Diagnosis Date  . Hypertension   . Hyperlipidemia   . GERD (gastroesophageal reflux disease)   . Depression   . Anxiety   . Actinic keratitis   . Kidney stones     "plenty; no ORs" (03/05/2014)  . Complication of anesthesia     difficulty urinating after anesthesia  . Childhood asthma   . Sleep apnea     "have mask; don't use it" (03/05/2014)  . Multiple allergies     "sinus stays swollen all the time"  . Migraine     "last one was in the 1990's" (03/05/2014)  . Arthritis     "left thumb" (03/05/2014)  . Chronic lower back pain     Surgeries: Procedure(s): TOTAL SHOULDER ARTHROPLASTY on 03/05/2014   Consultants:    Discharged Condition: Improved  Hospital Course: Corey Adams. is an 68 y.o. male who was admitted 03/05/2014 for operative treatment of right shoulder osteoarthritis. Patient has severe unremitting pain that affects sleep, daily activities, and work/hobbies. After pre-op clearance the patient was taken to the operating room on 03/05/2014 and underwent  Procedure(s): TOTAL SHOULDER ARTHROPLASTY.    Patient was given perioperative antibiotics:  Anti-infectives    Start     Dose/Rate Route Frequency Ordered Stop   03/05/14 1400  ceFAZolin (ANCEF) IVPB 2 g/50 mL premix     2 g100 mL/hr over 30 Minutes Intravenous Every 6 hours 03/05/14 1354 03/06/14 0227   03/05/14 0600  ceFAZolin (ANCEF) IVPB 2 g/50 mL premix     2 g100 mL/hr over 30 Minutes Intravenous On call to O.R. 03/04/14 1534 03/05/14 0804       Patient was given sequential compression devices, early ambulation, and ASA 325mg  BID to prevent DVT.  Patient benefited maximally from hospital stay and there were no complications.    Recent  vital signs: No data found.    Recent laboratory studies: No results for input(s): WBC, HGB, HCT, PLT, NA, K, CL, CO2, BUN, CREATININE, GLUCOSE, INR, CALCIUM in the last 72 hours.  Invalid input(s): PT, 2   Discharge Medications:     Medication List    STOP taking these medications        doxycycline 100 MG tablet  Commonly known as:  VIBRA-TABS      TAKE these medications        aspirin EC 325 MG tablet  Take 325 mg by mouth every 6 (six) hours as needed (pain).     atorvastatin 10 MG tablet  Commonly known as:  LIPITOR  Take 10 mg by mouth daily.     bisoprolol-hydrochlorothiazide 2.5-6.25 MG per tablet  Commonly known as:  ZIAC  Take 1 tablet by mouth daily.     docusate sodium 100 MG capsule  Commonly known as:  COLACE  Take 1 capsule (100 mg total) by mouth 3 (three) times daily as needed.     escitalopram 10 MG tablet  Commonly known as:  LEXAPRO  Take 10 mg by mouth daily.     levocetirizine 5 MG tablet  Commonly known as:  XYZAL  Take 5 mg by mouth every evening.     mometasone 50 MCG/ACT nasal spray  Commonly known as:  NASONEX  Place 2 sprays into the  nose 2 (two) times daily as needed (congestion).     omeprazole 40 MG capsule  Commonly known as:  PRILOSEC  Take 40 mg by mouth daily.     oxyCODONE-acetaminophen 5-325 MG per tablet  Commonly known as:  ROXICET  Take 1-2 tablets by mouth every 4 (four) hours as needed for severe pain.        Diagnostic Studies: Dg Chest 2 View  02/20/2014   CLINICAL DATA:  Hypertension, preop.  EXAM: CHEST  2 VIEW  COMPARISON:  October 21, 2012.  FINDINGS: The heart size and mediastinal contours are within normal limits. Both lungs are clear. No pneumothorax or pleural effusion is noted. The visualized skeletal structures are unremarkable.  IMPRESSION: No acute cardiopulmonary abnormality seen.   Electronically Signed   By: Sabino Dick M.D.   On: 02/20/2014 15:53   Dg Shoulder Right Port  03/05/2014   CLINICAL  DATA:  Post RIGHT shoulder arthroplasty  EXAM: PORTABLE RIGHT SHOULDER - 2+ VIEW  COMPARISON:  Portable exam 1103 hr without priors for comparison.  FINDINGS: Post resection of distal RIGHT clavicle.  RIGHT humeral prosthesis identified without fracture or dislocation grossly evident on single AP view.  Visualized ribs unremarkable.  Bones demineralized.  IMPRESSION: RIGHT humeral prosthesis without gross acute abnormality on single AP view.   Electronically Signed   By: Lavonia Dana M.D.   On: 03/05/2014 12:38    Disposition: 01-Home or Self Care      Discharge Instructions    Call MD / Call 911    Complete by:  As directed   If you experience chest pain or shortness of breath, CALL 911 and be transported to the hospital emergency room.  If you develope a fever above 101 F, pus (white drainage) or increased drainage or redness at the wound, or calf pain, call your surgeon's office.     Constipation Prevention    Complete by:  As directed   Drink plenty of fluids.  Prune juice may be helpful.  You may use a stool softener, such as Colace (over the counter) 100 mg twice a day.  Use MiraLax (over the counter) for constipation as needed.     Diet - low sodium heart healthy    Complete by:  As directed      Increase activity slowly as tolerated    Complete by:  As directed            Follow-up Information    Follow up with Nita Sells, MD. Schedule an appointment as soon as possible for a visit in 2 weeks.   Specialty:  Orthopedic Surgery   Contact information:   [address] [place] [address] [phone]        Signed: Grier Mitts 03/09/2014, 9:04 AM

## 2014-07-23 DIAGNOSIS — H2589 Other age-related cataract: Secondary | ICD-10-CM | POA: Diagnosis not present

## 2014-07-23 DIAGNOSIS — H524 Presbyopia: Secondary | ICD-10-CM | POA: Diagnosis not present

## 2014-08-21 DIAGNOSIS — F331 Major depressive disorder, recurrent, moderate: Secondary | ICD-10-CM | POA: Diagnosis not present

## 2014-10-02 DIAGNOSIS — F331 Major depressive disorder, recurrent, moderate: Secondary | ICD-10-CM | POA: Diagnosis not present

## 2014-10-13 DIAGNOSIS — D225 Melanocytic nevi of trunk: Secondary | ICD-10-CM | POA: Diagnosis not present

## 2014-10-13 DIAGNOSIS — L820 Inflamed seborrheic keratosis: Secondary | ICD-10-CM | POA: Diagnosis not present

## 2014-10-13 DIAGNOSIS — L570 Actinic keratosis: Secondary | ICD-10-CM | POA: Diagnosis not present

## 2014-10-13 DIAGNOSIS — X32XXXD Exposure to sunlight, subsequent encounter: Secondary | ICD-10-CM | POA: Diagnosis not present

## 2014-10-13 DIAGNOSIS — C44629 Squamous cell carcinoma of skin of left upper limb, including shoulder: Secondary | ICD-10-CM | POA: Diagnosis not present

## 2014-10-13 DIAGNOSIS — L821 Other seborrheic keratosis: Secondary | ICD-10-CM | POA: Diagnosis not present

## 2014-10-16 DIAGNOSIS — F331 Major depressive disorder, recurrent, moderate: Secondary | ICD-10-CM | POA: Diagnosis not present

## 2014-10-20 DIAGNOSIS — Z86010 Personal history of colonic polyps: Secondary | ICD-10-CM | POA: Diagnosis not present

## 2014-11-13 DIAGNOSIS — F331 Major depressive disorder, recurrent, moderate: Secondary | ICD-10-CM | POA: Diagnosis not present

## 2014-11-19 DIAGNOSIS — Z09 Encounter for follow-up examination after completed treatment for conditions other than malignant neoplasm: Secondary | ICD-10-CM | POA: Diagnosis not present

## 2014-11-19 DIAGNOSIS — K5730 Diverticulosis of large intestine without perforation or abscess without bleeding: Secondary | ICD-10-CM | POA: Diagnosis not present

## 2014-11-19 DIAGNOSIS — Z86010 Personal history of colonic polyps: Secondary | ICD-10-CM | POA: Diagnosis not present

## 2014-11-24 DIAGNOSIS — D0462 Carcinoma in situ of skin of left upper limb, including shoulder: Secondary | ICD-10-CM | POA: Diagnosis not present

## 2014-11-24 DIAGNOSIS — X32XXXD Exposure to sunlight, subsequent encounter: Secondary | ICD-10-CM | POA: Diagnosis not present

## 2014-11-24 DIAGNOSIS — L570 Actinic keratosis: Secondary | ICD-10-CM | POA: Diagnosis not present

## 2014-11-25 DIAGNOSIS — Z96611 Presence of right artificial shoulder joint: Secondary | ICD-10-CM | POA: Diagnosis not present

## 2014-11-25 DIAGNOSIS — Z471 Aftercare following joint replacement surgery: Secondary | ICD-10-CM | POA: Diagnosis not present

## 2014-11-27 DIAGNOSIS — F331 Major depressive disorder, recurrent, moderate: Secondary | ICD-10-CM | POA: Diagnosis not present

## 2014-12-11 DIAGNOSIS — F331 Major depressive disorder, recurrent, moderate: Secondary | ICD-10-CM | POA: Diagnosis not present

## 2014-12-25 DIAGNOSIS — F331 Major depressive disorder, recurrent, moderate: Secondary | ICD-10-CM | POA: Diagnosis not present

## 2015-01-08 DIAGNOSIS — F331 Major depressive disorder, recurrent, moderate: Secondary | ICD-10-CM | POA: Diagnosis not present

## 2015-01-15 DIAGNOSIS — F320 Major depressive disorder, single episode, mild: Secondary | ICD-10-CM | POA: Diagnosis not present

## 2015-01-15 DIAGNOSIS — Z23 Encounter for immunization: Secondary | ICD-10-CM | POA: Diagnosis not present

## 2015-01-15 DIAGNOSIS — R7303 Prediabetes: Secondary | ICD-10-CM | POA: Diagnosis not present

## 2015-01-15 DIAGNOSIS — I10 Essential (primary) hypertension: Secondary | ICD-10-CM | POA: Diagnosis not present

## 2015-01-15 DIAGNOSIS — E785 Hyperlipidemia, unspecified: Secondary | ICD-10-CM | POA: Diagnosis not present

## 2015-02-05 DIAGNOSIS — F331 Major depressive disorder, recurrent, moderate: Secondary | ICD-10-CM | POA: Diagnosis not present

## 2015-03-05 DIAGNOSIS — F331 Major depressive disorder, recurrent, moderate: Secondary | ICD-10-CM | POA: Diagnosis not present

## 2015-03-19 DIAGNOSIS — F331 Major depressive disorder, recurrent, moderate: Secondary | ICD-10-CM | POA: Diagnosis not present

## 2016-07-19 IMAGING — CR DG SHOULDER 2+V PORT*R*
1 series · 1 of 1 positions shown · non-contrast
Comparison: Portable exam 0034 hr without priors for comparison.

CLINICAL DATA: Post RIGHT shoulder arthroplasty

EXAM:
PORTABLE RIGHT SHOULDER - 2+ VIEW

[AP]
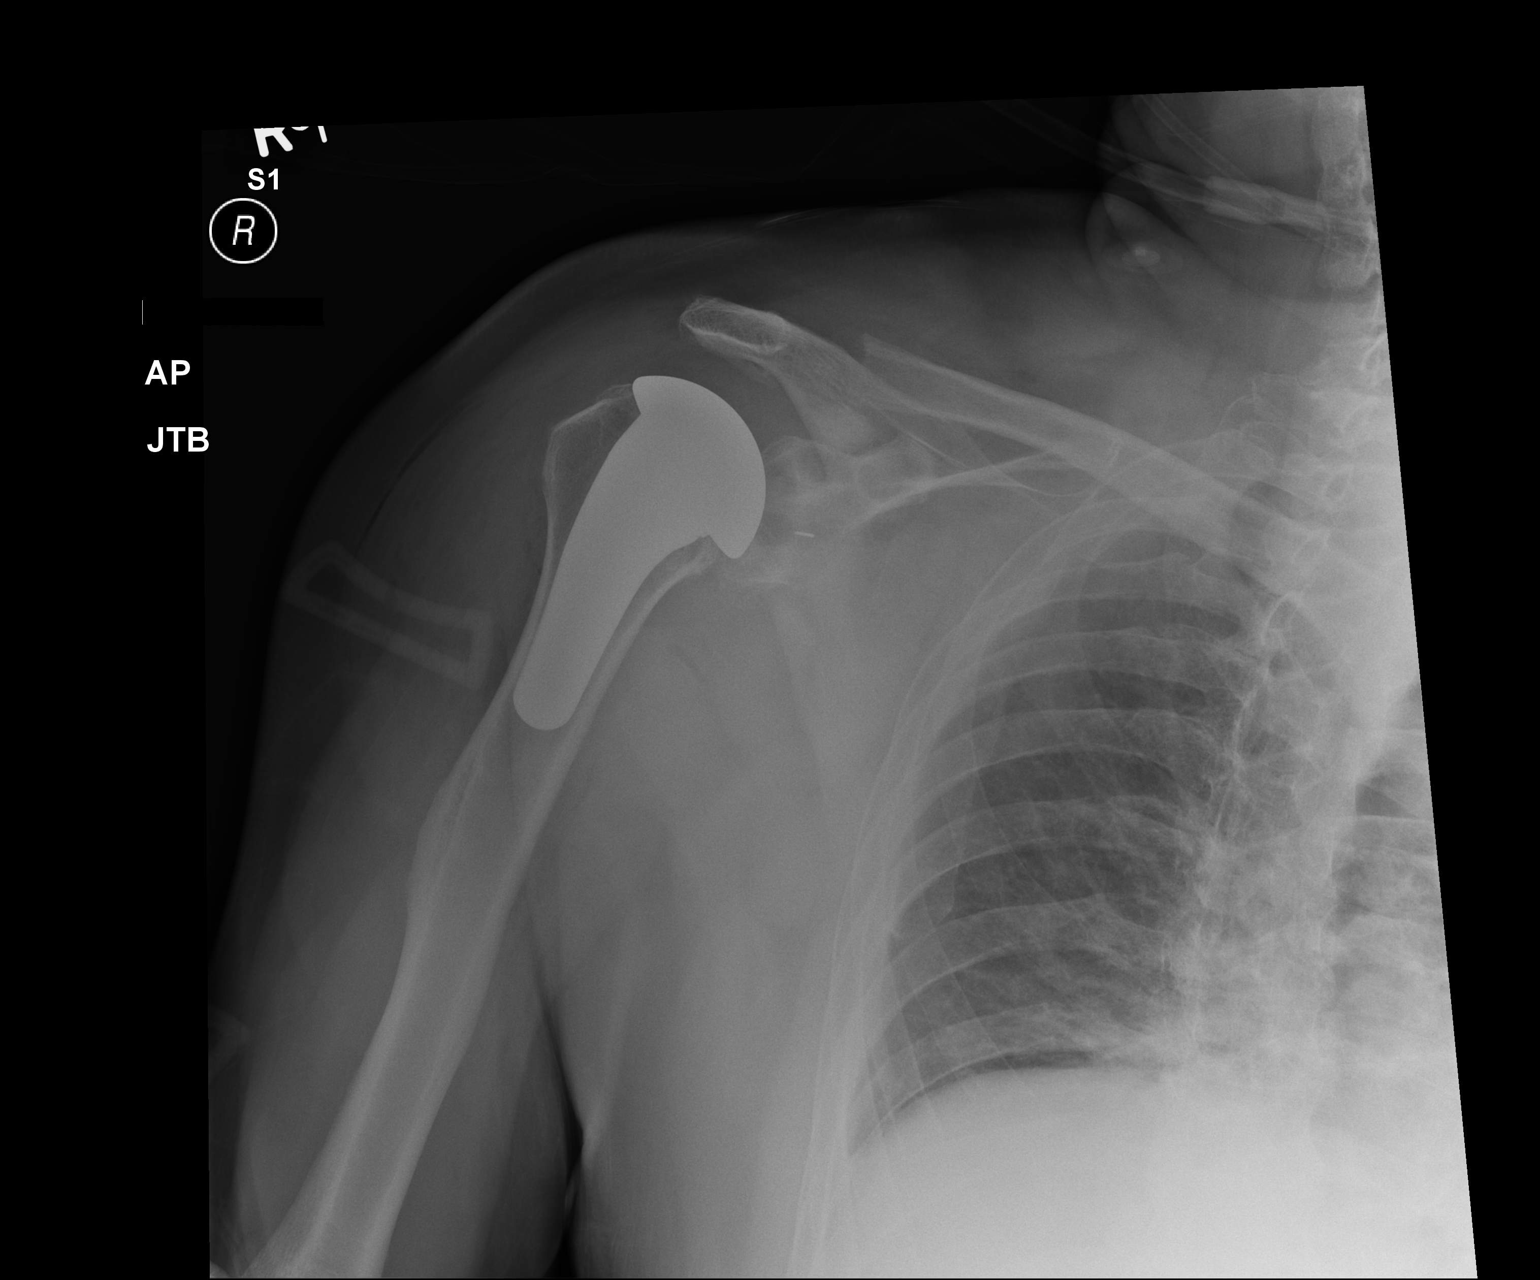

[1 of 1 positions shown; findings below may reference images not displayed]

FINDINGS: Post resection of distal RIGHT clavicle.

RIGHT humeral prosthesis identified without fracture or dislocation
grossly evident on single AP view.

Visualized ribs unremarkable.

Bones demineralized.
IMPRESSION: RIGHT humeral prosthesis without gross acute abnormality on single
AP view.

## 2016-08-22 ENCOUNTER — Other Ambulatory Visit: Payer: Self-pay | Admitting: Orthopedic Surgery

## 2016-08-22 DIAGNOSIS — M19012 Primary osteoarthritis, left shoulder: Secondary | ICD-10-CM

## 2016-08-24 ENCOUNTER — Other Ambulatory Visit: Payer: Self-pay | Admitting: Orthopedic Surgery

## 2016-08-24 ENCOUNTER — Ambulatory Visit
Admission: RE | Admit: 2016-08-24 | Discharge: 2016-08-24 | Disposition: A | Discharge status: Home / Self Care | Payer: Medicare Other | Source: Ambulatory Visit | Attending: Orthopedic Surgery | Admitting: Orthopedic Surgery

## 2016-08-24 DIAGNOSIS — M19012 Primary osteoarthritis, left shoulder: Secondary | ICD-10-CM

## 2016-09-04 ENCOUNTER — Encounter (HOSPITAL_COMMUNITY): Payer: Self-pay

## 2016-09-04 ENCOUNTER — Ambulatory Visit (HOSPITAL_COMMUNITY)
Admission: RE | Admit: 2016-09-04 | Discharge: 2016-09-04 | Disposition: A | Discharge status: Home / Self Care | Payer: Medicare Other | Source: Ambulatory Visit | Attending: Orthopedic Surgery | Admitting: Orthopedic Surgery

## 2016-09-04 ENCOUNTER — Encounter (HOSPITAL_COMMUNITY)
Admission: RE | Admit: 2016-09-04 | Discharge: 2016-09-04 | Disposition: A | Discharge status: Home / Self Care | Payer: Medicare Other | Source: Ambulatory Visit | Attending: Orthopedic Surgery | Admitting: Orthopedic Surgery

## 2016-09-04 DIAGNOSIS — M19012 Primary osteoarthritis, left shoulder: Secondary | ICD-10-CM

## 2016-09-04 DIAGNOSIS — Z01818 Encounter for other preprocedural examination: Secondary | ICD-10-CM

## 2016-09-04 DIAGNOSIS — Z01812 Encounter for preprocedural laboratory examination: Secondary | ICD-10-CM

## 2016-09-04 DIAGNOSIS — J449 Chronic obstructive pulmonary disease, unspecified: Secondary | ICD-10-CM

## 2016-09-04 DIAGNOSIS — K449 Diaphragmatic hernia without obstruction or gangrene: Secondary | ICD-10-CM | POA: Insufficient documentation

## 2016-09-04 DIAGNOSIS — M24012 Loose body in left shoulder: Secondary | ICD-10-CM | POA: Insufficient documentation

## 2016-09-04 HISTORY — DX: Alcohol abuse, uncomplicated: F10.10

## 2016-09-04 LAB — SURGICAL PCR SCREEN
MRSA, PCR: NEGATIVE
STAPHYLOCOCCUS AUREUS: NEGATIVE

## 2016-09-04 LAB — URINALYSIS, ROUTINE W REFLEX MICROSCOPIC
BILIRUBIN URINE: NEGATIVE
Glucose, UA: NEGATIVE mg/dL
KETONES UR: NEGATIVE mg/dL
NITRITE: NEGATIVE
Protein, ur: NEGATIVE mg/dL
Specific Gravity, Urine: 1.021 (ref 1.005–1.030)
Squamous Epithelial / LPF: NONE SEEN
pH: 5 (ref 5.0–8.0)

## 2016-09-04 LAB — COMPREHENSIVE METABOLIC PANEL
ALBUMIN: 4.1 g/dL (ref 3.5–5.0)
ALT: 20 U/L (ref 17–63)
AST: 23 U/L (ref 15–41)
Alkaline Phosphatase: 61 U/L (ref 38–126)
Anion gap: 9 (ref 5–15)
BILIRUBIN TOTAL: 0.4 mg/dL (ref 0.3–1.2)
BUN: 19 mg/dL (ref 6–20)
CHLORIDE: 108 mmol/L (ref 101–111)
CO2: 25 mmol/L (ref 22–32)
Calcium: 9.2 mg/dL (ref 8.9–10.3)
Creatinine, Ser: 1.64 mg/dL — ABNORMAL HIGH (ref 0.61–1.24)
GFR calc Af Amer: 48 mL/min — ABNORMAL LOW (ref >60)
GFR calc non Af Amer: 41 mL/min — ABNORMAL LOW (ref >60)
GLUCOSE: 116 mg/dL — AB (ref 65–99)
POTASSIUM: 4.1 mmol/L (ref 3.5–5.1)
Sodium: 142 mmol/L (ref 135–145)
Total Protein: 7.1 g/dL (ref 6.5–8.1)

## 2016-09-04 LAB — CBC WITH DIFFERENTIAL/PLATELET
BASOS ABS: 0 10*3/uL (ref 0.0–0.1)
BASOS PCT: 1 %
Eosinophils Absolute: 0.3 10*3/uL (ref 0.0–0.7)
Eosinophils Relative: 4 %
HEMATOCRIT: 41.5 % (ref 39.0–52.0)
Hemoglobin: 13.5 g/dL (ref 13.0–17.0)
Lymphocytes Relative: 47 %
Lymphs Abs: 3.5 10*3/uL (ref 0.7–4.0)
MCH: 30 pg (ref 26.0–34.0)
MCHC: 32.5 g/dL (ref 30.0–36.0)
MCV: 92.2 fL (ref 78.0–100.0)
MONO ABS: 0.5 10*3/uL (ref 0.1–1.0)
Monocytes Relative: 7 %
Neutro Abs: 3 10*3/uL (ref 1.7–7.7)
Neutrophils Relative %: 41 %
PLATELETS: 196 10*3/uL (ref 150–400)
RBC: 4.5 MIL/uL (ref 4.22–5.81)
RDW: 13.3 % (ref 11.5–15.5)
WBC: 7.3 10*3/uL (ref 4.0–10.5)

## 2016-09-04 LAB — PROTIME-INR
INR: 0.98
Prothrombin Time: 13 seconds (ref 11.4–15.2)

## 2016-09-04 LAB — APTT: APTT: 36 s (ref 24–36)

## 2016-09-04 NOTE — Progress Notes (Signed)
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 Pt states he thinks he has had betadine before with no reaction, but unsure. Called and left a message at Dr. Bettina Gavia office regarding this and need for new prep order.   Patient denies shortness of breath, fever, cough and chest pain at PAT appointment   Patient verbalized understanding of instructions that were given to them at the PAT appointment. Patient was also instructed that they will need to review over the PAT instructions again at home before surgery.

## 2016-09-04 NOTE — Pre-Procedure Instructions (Signed)
Luretha Murphy.  09/04/2016      [facility], [place] - [address] AT [place] [place] [address] Phone: [phone] Fax: [phone]  [facility] - [place], [place] [address] Phone: [phone] Fax: [phone]    Your procedure is scheduled on Thursday June 7.  Report to [facility] Admitting at 8:30 A.M.  Call this number if you have problems the morning of surgery:  [phone]   Remember:  Do not eat food or drink liquids after midnight.  Take these medicines the morning of surgery with A SIP OF WATER: escitalopram (lexapro), omeprazole (prilsoec), nasonex if needed  7 days prior to surgery STOP taking any Aspirin, Aleve, Naproxen, Ibuprofen, Motrin, Advil, Goody's, BC's, all herbal medications, fish oil, and all vitamins    Do not wear jewelry, make-up or nail polish.  Do not wear lotions, powders, or perfumes, or deoderant.  Do not shave 48 hours prior to surgery.  Men may shave face and neck.  Do not bring valuables to the hospital.  [facility] is not responsible for any belongings or valuables.  Contacts, dentures or bridgework may not be worn into surgery.  Leave your suitcase in the car.  After surgery it may be brought to your room.  For patients admitted to the hospital, discharge time will be determined by your treatment team.  Patients discharged the day of surgery will not be allowed to drive home.    Special instructions:    [place]- Preparing For Surgery  Before surgery, you can play an important role. Because skin is not sterile, your skin needs to be as free of germs as possible. You can reduce the number of germs on your skin by washing with CHG (chlorahexidine gluconate) Soap before surgery.  CHG is an antiseptic cleaner which kills germs and bonds with the skin to continue killing germs even after  washing.  Please do not use if you have an allergy to CHG or antibacterial soaps. If your skin becomes reddened/irritated stop using the CHG.  Do not shave (including legs and underarms) for at least 48 hours prior to first CHG shower. It is OK to shave your face.  Please follow these instructions carefully.   1. Shower the NIGHT BEFORE SURGERY and the MORNING OF SURGERY with CHG.   2. If you chose to wash your hair, wash your hair first as usual with your normal shampoo.  3. After you shampoo, rinse your hair and body thoroughly to remove the shampoo.  4. Use CHG as you would any other liquid soap. You can apply CHG directly to the skin and wash gently with a scrungie or a clean washcloth.   5. Apply the CHG Soap to your body ONLY FROM THE NECK DOWN.  Do not use on open wounds or open sores. Avoid contact with your eyes, ears, mouth and genitals (private parts). Wash genitals (private parts) with your normal soap.  6. Wash thoroughly, paying special attention to the area where your surgery will be performed.  7. Thoroughly rinse your body with warm water from the neck down.  8. DO NOT shower/wash with your normal soap after using and rinsing off the CHG Soap.  9. Pat yourself dry with a CLEAN TOWEL.   10. Wear CLEAN PAJAMAS   11. Place CLEAN SHEETS on your bed  the night of your first shower and DO NOT SLEEP WITH PETS.    Day of Surgery: Do not apply any deodorants/lotions. Please wear clean clothes to the hospital/surgery center.      Please read over the following fact sheets that you were given. MRSA Information

## 2016-09-04 NOTE — Progress Notes (Signed)
   09/04/16 1518  OBSTRUCTIVE SLEEP APNEA  Have you ever been diagnosed with sleep apnea through a sleep study? No  Do you snore loudly (loud enough to be heard through closed doors)?  1  Do you often feel tired, fatigued, or sleepy during the daytime (such as falling asleep during driving or talking to someone)? 0  Has anyone observed you stop breathing during your sleep? 1  Do you have, or are you being treated for high blood pressure? 1  BMI more than 35 kg/m2? 0  Age > 50 (1-yes) 1  Neck circumference greater than:Male 16 inches or larger, Male 17inches or larger? 0  Male Gender (Yes=1) 1  Obstructive Sleep Apnea Score 5

## 2016-09-05 ENCOUNTER — Other Ambulatory Visit: Payer: Self-pay | Admitting: Orthopedic Surgery

## 2016-09-06 MED ORDER — CHLORHEXIDINE GLUCONATE 4 % EX LIQD: Freq: Once | CUTANEOUS | Status: AC

## 2016-09-06 MED ORDER — CEFAZOLIN SODIUM-DEXTROSE 2-4 GM/100ML-% IV SOLN
2.0000 g | INTRAVENOUS | Status: AC
  Administered 2016-09-07: 2 g via INTRAVENOUS
  Filled 2016-09-06: qty 100

## 2016-09-07 ENCOUNTER — Inpatient Hospital Stay (HOSPITAL_COMMUNITY): Payer: Medicare Other | Admitting: Certified Registered Nurse Anesthetist

## 2016-09-07 ENCOUNTER — Inpatient Hospital Stay (HOSPITAL_COMMUNITY): Payer: Medicare Other

## 2016-09-07 ENCOUNTER — Encounter (HOSPITAL_COMMUNITY)
Admission: RE | Disposition: A | Discharge status: Home / Self Care | Payer: Self-pay | Source: Ambulatory Visit | Attending: Orthopedic Surgery

## 2016-09-07 ENCOUNTER — Inpatient Hospital Stay (HOSPITAL_COMMUNITY)
Admission: RE | Admit: 2016-09-07 | Discharge: 2016-09-08 | DRG: 483 | Disposition: A | Discharge status: Home / Self Care | Payer: Medicare Other | Source: Ambulatory Visit | Attending: Orthopedic Surgery | Admitting: Orthopedic Surgery

## 2016-09-07 ENCOUNTER — Other Ambulatory Visit (HOSPITAL_COMMUNITY): Payer: Self-pay | Admitting: Surgery

## 2016-09-07 ENCOUNTER — Encounter (HOSPITAL_COMMUNITY): Payer: Self-pay | Admitting: Certified Registered Nurse Anesthetist

## 2016-09-07 DIAGNOSIS — J45909 Unspecified asthma, uncomplicated: Secondary | ICD-10-CM | POA: Diagnosis present

## 2016-09-07 DIAGNOSIS — Z7982 Long term (current) use of aspirin: Secondary | ICD-10-CM

## 2016-09-07 DIAGNOSIS — I10 Essential (primary) hypertension: Secondary | ICD-10-CM | POA: Diagnosis present

## 2016-09-07 DIAGNOSIS — Z96611 Presence of right artificial shoulder joint: Secondary | ICD-10-CM | POA: Diagnosis present

## 2016-09-07 DIAGNOSIS — Z91013 Allergy to seafood: Secondary | ICD-10-CM

## 2016-09-07 DIAGNOSIS — G8929 Other chronic pain: Secondary | ICD-10-CM | POA: Diagnosis present

## 2016-09-07 DIAGNOSIS — M19012 Primary osteoarthritis, left shoulder: Secondary | ICD-10-CM | POA: Diagnosis present

## 2016-09-07 DIAGNOSIS — Z79899 Other long term (current) drug therapy: Secondary | ICD-10-CM | POA: Diagnosis not present

## 2016-09-07 DIAGNOSIS — Z885 Allergy status to narcotic agent status: Secondary | ICD-10-CM

## 2016-09-07 DIAGNOSIS — Z79891 Long term (current) use of opiate analgesic: Secondary | ICD-10-CM

## 2016-09-07 DIAGNOSIS — K219 Gastro-esophageal reflux disease without esophagitis: Secondary | ICD-10-CM | POA: Diagnosis present

## 2016-09-07 DIAGNOSIS — Z888 Allergy status to other drugs, medicaments and biological substances status: Secondary | ICD-10-CM

## 2016-09-07 DIAGNOSIS — F419 Anxiety disorder, unspecified: Secondary | ICD-10-CM | POA: Diagnosis present

## 2016-09-07 DIAGNOSIS — Z96612 Presence of left artificial shoulder joint: Secondary | ICD-10-CM

## 2016-09-07 DIAGNOSIS — M545 Low back pain: Secondary | ICD-10-CM | POA: Diagnosis present

## 2016-09-07 DIAGNOSIS — F329 Major depressive disorder, single episode, unspecified: Secondary | ICD-10-CM | POA: Diagnosis present

## 2016-09-07 DIAGNOSIS — G4730 Sleep apnea, unspecified: Secondary | ICD-10-CM | POA: Diagnosis present

## 2016-09-07 HISTORY — PX: TOTAL SHOULDER ARTHROPLASTY: SHX126

## 2016-09-07 SURGERY — ARTHROPLASTY, SHOULDER, TOTAL
Anesthesia: Regional | Laterality: Left

## 2016-09-07 MED ORDER — OXYCODONE HCL 5 MG PO TABS
5.0000 mg | ORAL_TABLET | ORAL | Status: DC | PRN
  Administered 2016-09-08: 10 mg via ORAL
  Administered 2016-09-08: 5 mg via ORAL
  Administered 2016-09-08: 10 mg via ORAL
  Filled 2016-09-07 (×2): qty 2
  Filled 2016-09-07: qty 1

## 2016-09-07 MED ORDER — METOCLOPRAMIDE HCL 5 MG PO TABS
5.0000 mg | ORAL_TABLET | Freq: Three times a day (TID) | ORAL | Status: DC | PRN
Start: 2016-09-07 — End: 2016-09-08

## 2016-09-07 MED ORDER — POLYETHYLENE GLYCOL 3350 17 G PO PACK: 17.0000 g | PACK | Freq: Every day | ORAL | Status: DC | PRN

## 2016-09-07 MED ORDER — POVIDONE-IODINE 7.5 % EX SOLN
Freq: Once | CUTANEOUS | Status: DC
  Filled 2016-09-07: qty 118

## 2016-09-07 MED ORDER — ONDANSETRON HCL 4 MG/2ML IJ SOLN: 4.0000 mg | Freq: Four times a day (QID) | INTRAMUSCULAR | Status: DC | PRN

## 2016-09-07 MED ORDER — FENTANYL CITRATE (PF) 250 MCG/5ML IJ SOLN
INTRAMUSCULAR | Status: AC
  Filled 2016-09-07: qty 5

## 2016-09-07 MED ORDER — OXYCODONE HCL 5 MG/5ML PO SOLN: 5.0000 mg | Freq: Once | ORAL | Status: DC | PRN

## 2016-09-07 MED ORDER — PANTOPRAZOLE SODIUM 40 MG PO TBEC
40.0000 mg | DELAYED_RELEASE_TABLET | Freq: Every day | ORAL | Status: DC
  Administered 2016-09-08: 40 mg via ORAL
  Filled 2016-09-07: qty 1

## 2016-09-07 MED ORDER — ROCURONIUM BROMIDE 100 MG/10ML IV SOLN
INTRAVENOUS | Status: DC | PRN
  Administered 2016-09-07: 60 mg via INTRAVENOUS

## 2016-09-07 MED ORDER — ASPIRIN EC 325 MG PO TBEC
325.0000 mg | DELAYED_RELEASE_TABLET | Freq: Every day | ORAL | Status: DC
  Administered 2016-09-07 – 2016-09-08 (×2): 325 mg via ORAL
  Filled 2016-09-07 (×3): qty 1

## 2016-09-07 MED ORDER — MIDAZOLAM HCL 2 MG/2ML IJ SOLN
INTRAMUSCULAR | Status: AC
  Filled 2016-09-07: qty 2

## 2016-09-07 MED ORDER — 0.9 % SODIUM CHLORIDE (POUR BTL) OPTIME
TOPICAL | Status: DC | PRN
  Administered 2016-09-07: 1000 mL

## 2016-09-07 MED ORDER — ACETAMINOPHEN 500 MG PO TABS
1000.0000 mg | ORAL_TABLET | Freq: Four times a day (QID) | ORAL | Status: DC
  Administered 2016-09-07 – 2016-09-08 (×3): 1000 mg via ORAL
  Filled 2016-09-07 (×4): qty 2

## 2016-09-07 MED ORDER — PROPOFOL 10 MG/ML IV BOLUS
INTRAVENOUS | Status: DC | PRN
Start: 2016-09-07 — End: 2016-09-07
  Administered 2016-09-07: 20 mg via INTRAVENOUS
  Administered 2016-09-07: 100 mg via INTRAVENOUS

## 2016-09-07 MED ORDER — SODIUM CHLORIDE 0.9 % IV SOLN
1000.0000 mg | INTRAVENOUS | Status: AC
  Administered 2016-09-07: 1000 mg via INTRAVENOUS
  Filled 2016-09-07: qty 10

## 2016-09-07 MED ORDER — ROCURONIUM BROMIDE 10 MG/ML (PF) SYRINGE
PREFILLED_SYRINGE | INTRAVENOUS | Status: AC
  Filled 2016-09-07: qty 5

## 2016-09-07 MED ORDER — ATORVASTATIN CALCIUM 10 MG PO TABS
10.0000 mg | ORAL_TABLET | Freq: Every day | ORAL | Status: DC
  Administered 2016-09-07: 10 mg via ORAL
  Filled 2016-09-07: qty 1

## 2016-09-07 MED ORDER — FENTANYL CITRATE (PF) 100 MCG/2ML IJ SOLN
100.0000 ug | Freq: Once | INTRAMUSCULAR | Status: AC
  Administered 2016-09-07: 100 ug via INTRAVENOUS
  Filled 2016-09-07: qty 2

## 2016-09-07 MED ORDER — SODIUM CHLORIDE 0.9 % IV SOLN
INTRAVENOUS | Status: DC
  Administered 2016-09-07 (×2): via INTRAVENOUS

## 2016-09-07 MED ORDER — OXYCODONE HCL 5 MG PO TABS
5.0000 mg | ORAL_TABLET | Freq: Once | ORAL | Status: DC | PRN
Start: 2016-09-07 — End: 2016-09-07

## 2016-09-07 MED ORDER — SODIUM CHLORIDE 0.9% FLUSH
INTRAVENOUS | Status: DC | PRN
  Administered 2016-09-07: 40 mL

## 2016-09-07 MED ORDER — ACETAMINOPHEN 325 MG PO TABS: 650.0000 mg | ORAL_TABLET | Freq: Four times a day (QID) | ORAL | Status: DC | PRN

## 2016-09-07 MED ORDER — BISACODYL 5 MG PO TBEC: 5.0000 mg | DELAYED_RELEASE_TABLET | Freq: Every day | ORAL | Status: DC | PRN

## 2016-09-07 MED ORDER — ONDANSETRON HCL 4 MG/2ML IJ SOLN
INTRAMUSCULAR | Status: DC | PRN
Start: 2016-09-07 — End: 2016-09-07
  Administered 2016-09-07: 4 mg via INTRAVENOUS

## 2016-09-07 MED ORDER — PHENYLEPHRINE HCL 10 MG/ML IJ SOLN
INTRAVENOUS | Status: DC | PRN
  Administered 2016-09-07: 15 ug/min via INTRAVENOUS

## 2016-09-07 MED ORDER — PHENOL 1.4 % MT LIQD: 1.0000 | OROMUCOSAL | Status: DC | PRN

## 2016-09-07 MED ORDER — PROPOFOL 10 MG/ML IV BOLUS
INTRAVENOUS | Status: AC
  Filled 2016-09-07: qty 20

## 2016-09-07 MED ORDER — ONDANSETRON HCL 4 MG/2ML IJ SOLN
INTRAMUSCULAR | Status: AC
  Filled 2016-09-07: qty 2

## 2016-09-07 MED ORDER — PHENYLEPHRINE 40 MCG/ML (10ML) SYRINGE FOR IV PUSH (FOR BLOOD PRESSURE SUPPORT)
PREFILLED_SYRINGE | INTRAVENOUS | Status: AC
  Filled 2016-09-07: qty 10

## 2016-09-07 MED ORDER — SUGAMMADEX SODIUM 200 MG/2ML IV SOLN
INTRAVENOUS | Status: AC
  Filled 2016-09-07: qty 2

## 2016-09-07 MED ORDER — MENTHOL 3 MG MT LOZG
1.0000 | LOZENGE | OROMUCOSAL | Status: DC | PRN
  Administered 2016-09-07: 3 mg via ORAL
  Filled 2016-09-07: qty 9

## 2016-09-07 MED ORDER — MORPHINE SULFATE (PF) 4 MG/ML IV SOLN: 1.0000 mg | INTRAVENOUS | Status: DC | PRN

## 2016-09-07 MED ORDER — METOCLOPRAMIDE HCL 5 MG/ML IJ SOLN
5.0000 mg | Freq: Three times a day (TID) | INTRAMUSCULAR | Status: DC | PRN
Start: 2016-09-07 — End: 2016-09-08

## 2016-09-07 MED ORDER — SUGAMMADEX SODIUM 200 MG/2ML IV SOLN
INTRAVENOUS | Status: DC | PRN
  Administered 2016-09-07: 200 mg via INTRAVENOUS

## 2016-09-07 MED ORDER — BISOPROLOL-HYDROCHLOROTHIAZIDE 2.5-6.25 MG PO TABS
1.0000 | ORAL_TABLET | Freq: Every evening | ORAL | Status: DC
  Administered 2016-09-07: 1 via ORAL
  Filled 2016-09-07: qty 1

## 2016-09-07 MED ORDER — EPHEDRINE SULFATE 50 MG/ML IJ SOLN
INTRAMUSCULAR | Status: DC | PRN
  Administered 2016-09-07 (×3): 5 mg via INTRAVENOUS

## 2016-09-07 MED ORDER — DOCUSATE SODIUM 100 MG PO CAPS
100.0000 mg | ORAL_CAPSULE | Freq: Two times a day (BID) | ORAL | Status: DC
  Administered 2016-09-08 (×2): 100 mg via ORAL
  Filled 2016-09-07 (×2): qty 1

## 2016-09-07 MED ORDER — FLEET ENEMA 7-19 GM/118ML RE ENEM: 1.0000 | ENEMA | Freq: Once | RECTAL | Status: DC | PRN

## 2016-09-07 MED ORDER — HYDROMORPHONE HCL 1 MG/ML IJ SOLN: 0.2500 mg | INTRAMUSCULAR | Status: DC | PRN

## 2016-09-07 MED ORDER — ONDANSETRON HCL 4 MG/2ML IJ SOLN
INTRAMUSCULAR | Status: AC
Start: 2016-09-07 — End: 2016-09-07
  Filled 2016-09-07: qty 2

## 2016-09-07 MED ORDER — ALUM & MAG HYDROXIDE-SIMETH 200-200-20 MG/5ML PO SUSP: 30.0000 mL | ORAL | Status: DC | PRN

## 2016-09-07 MED ORDER — ONDANSETRON HCL 4 MG PO TABS: 4.0000 mg | ORAL_TABLET | Freq: Four times a day (QID) | ORAL | Status: DC | PRN

## 2016-09-07 MED ORDER — ESCITALOPRAM OXALATE 10 MG PO TABS
20.0000 mg | ORAL_TABLET | Freq: Every day | ORAL | Status: DC
  Administered 2016-09-08: 20 mg via ORAL
  Filled 2016-09-07: qty 2

## 2016-09-07 MED ORDER — METHOCARBAMOL 500 MG PO TABS
500.0000 mg | ORAL_TABLET | Freq: Four times a day (QID) | ORAL | Status: DC | PRN
  Administered 2016-09-08 (×2): 500 mg via ORAL
  Filled 2016-09-07 (×2): qty 1

## 2016-09-07 MED ORDER — LACTATED RINGERS IV SOLN
INTRAVENOUS | Status: DC
  Administered 2016-09-07 (×2): via INTRAVENOUS

## 2016-09-07 MED ORDER — DEXTROSE 5 % IV SOLN: 500.0000 mg | Freq: Four times a day (QID) | INTRAVENOUS | Status: DC | PRN

## 2016-09-07 MED ORDER — BUPIVACAINE LIPOSOME 1.3 % IJ SUSP
20.0000 mL | INTRAMUSCULAR | Status: AC
  Administered 2016-09-07: 35 mL
  Filled 2016-09-07: qty 20

## 2016-09-07 MED ORDER — FENTANYL CITRATE (PF) 100 MCG/2ML IJ SOLN
INTRAMUSCULAR | Status: AC
  Administered 2016-09-07: 100 ug via INTRAVENOUS
  Filled 2016-09-07: qty 2

## 2016-09-07 MED ORDER — ZOLPIDEM TARTRATE 5 MG PO TABS: 5.0000 mg | ORAL_TABLET | Freq: Every evening | ORAL | Status: DC | PRN

## 2016-09-07 MED ORDER — CEFAZOLIN SODIUM-DEXTROSE 2-4 GM/100ML-% IV SOLN
2.0000 g | Freq: Four times a day (QID) | INTRAVENOUS | Status: AC
  Administered 2016-09-07 – 2016-09-08 (×3): 2 g via INTRAVENOUS
  Filled 2016-09-07 (×3): qty 100

## 2016-09-07 MED ORDER — ACETAMINOPHEN 650 MG RE SUPP: 650.0000 mg | Freq: Four times a day (QID) | RECTAL | Status: DC | PRN

## 2016-09-07 MED ORDER — DIPHENHYDRAMINE HCL 12.5 MG/5ML PO ELIX: 12.5000 mg | ORAL_SOLUTION | ORAL | Status: DC | PRN

## 2016-09-07 SURGICAL SUPPLY — 72 items
AID PSTN UNV HD RSTRNT DISP (MISCELLANEOUS) ×1
BIT DRILL 5/64X5 DISP (BIT) ×3 IMPLANT
BLADE SAW SAG 73X25 THK (BLADE) ×2
BLADE SAW SGTL 73X25 THK (BLADE) ×1 IMPLANT
BLADE SURG 15 STRL LF DISP TIS (BLADE) IMPLANT
BLADE SURG 15 STRL SS (BLADE)
CAP SHOULDER TOTAL 2 ×3 IMPLANT
CEMENT BONE DEPUY (Cement) ×3 IMPLANT
CHLORAPREP W/TINT 26ML (MISCELLANEOUS) ×6 IMPLANT
CLOSURE STERI-STRIP 1/2X4 (GAUZE/BANDAGES/DRESSINGS) ×1
CLOSURE WOUND 1/2 X4 (GAUZE/BANDAGES/DRESSINGS)
CLSR STERI-STRIP ANTIMIC 1/2X4 (GAUZE/BANDAGES/DRESSINGS) ×2 IMPLANT
COVER SURGICAL LIGHT HANDLE (MISCELLANEOUS) ×3 IMPLANT
DRAPE INCISE IOBAN 66X45 STRL (DRAPES) ×3 IMPLANT
DRAPE ORTHO SPLIT 77X108 STRL (DRAPES)
DRAPE SURG 17X23 STRL (DRAPES) ×3 IMPLANT
DRAPE SURG ORHT 6 SPLT 77X108 (DRAPES) IMPLANT
DRAPE U-SHAPE 47X51 STRL (DRAPES) ×3 IMPLANT
DRSG AQUACEL AG ADV 3.5X10 (GAUZE/BANDAGES/DRESSINGS) ×3 IMPLANT
ELECT BLADE 4.0 EZ CLEAN MEGAD (MISCELLANEOUS)
ELECT REM PT RETURN 9FT ADLT (ELECTROSURGICAL) ×3
ELECTRODE BLDE 4.0 EZ CLN MEGD (MISCELLANEOUS) IMPLANT
ELECTRODE REM PT RTRN 9FT ADLT (ELECTROSURGICAL) ×1 IMPLANT
EVACUATOR 1/8 PVC DRAIN (DRAIN) IMPLANT
GLOVE BIO SURGEON STRL SZ7 (GLOVE) ×6 IMPLANT
GLOVE BIO SURGEON STRL SZ7.5 (GLOVE) ×6 IMPLANT
GLOVE BIOGEL PI IND STRL 7.0 (GLOVE) ×2 IMPLANT
GLOVE BIOGEL PI IND STRL 8 (GLOVE) ×1 IMPLANT
GLOVE BIOGEL PI INDICATOR 7.0 (GLOVE) ×4
GLOVE BIOGEL PI INDICATOR 8 (GLOVE) ×2
GOWN STRL REUS W/ TWL LRG LVL3 (GOWN DISPOSABLE) ×2 IMPLANT
GOWN STRL REUS W/ TWL XL LVL3 (GOWN DISPOSABLE) ×2 IMPLANT
GOWN STRL REUS W/TWL LRG LVL3 (GOWN DISPOSABLE) ×6
GOWN STRL REUS W/TWL XL LVL3 (GOWN DISPOSABLE) ×6
GUIDEWIRE GLENOID 2.5X220 (WIRE) IMPLANT
HANDPIECE INTERPULSE COAX TIP (DISPOSABLE) ×3
HEMOSTAT SURGICEL 2X14 (HEMOSTASIS) ×3 IMPLANT
HOOD PEEL AWAY FLYTE STAYCOOL (MISCELLANEOUS) ×9 IMPLANT
KIT BASIN OR (CUSTOM PROCEDURE TRAY) ×3 IMPLANT
KIT ROOM TURNOVER OR (KITS) ×3 IMPLANT
MANIFOLD NEPTUNE II (INSTRUMENTS) ×3 IMPLANT
NEEDLE HYPO 25GX1X1/2 BEV (NEEDLE) ×3 IMPLANT
NEEDLE MAYO TROCAR (NEEDLE) ×3 IMPLANT
NEEDLE SPNL 18GX3.5 QUINCKE PK (NEEDLE) ×6 IMPLANT
NS IRRIG 1000ML POUR BTL (IV SOLUTION) ×3 IMPLANT
PACK SHOULDER (CUSTOM PROCEDURE TRAY) ×3 IMPLANT
PAD ARMBOARD 7.5X6 YLW CONV (MISCELLANEOUS) ×6 IMPLANT
RESTRAINT HEAD UNIVERSAL NS (MISCELLANEOUS) ×3 IMPLANT
RETRIEVER SUT HEWSON (MISCELLANEOUS) ×3 IMPLANT
SET HNDPC FAN SPRY TIP SCT (DISPOSABLE) ×1 IMPLANT
SLING ARM FOAM STRAP LRG (SOFTGOODS) ×3 IMPLANT
SLING ARM FOAM STRAP MED (SOFTGOODS) IMPLANT
SMARTMIX MINI TOWER (MISCELLANEOUS) ×3
SPONGE LAP 18X18 X RAY DECT (DISPOSABLE) ×3 IMPLANT
SPONGE LAP 4X18 X RAY DECT (DISPOSABLE) IMPLANT
STRIP CLOSURE SKIN 1/2X4 (GAUZE/BANDAGES/DRESSINGS) IMPLANT
SUCTION FRAZIER HANDLE 10FR (MISCELLANEOUS) ×2
SUCTION TUBE FRAZIER 10FR DISP (MISCELLANEOUS) ×1 IMPLANT
SUPPORT WRAP ARM LG (MISCELLANEOUS) ×3 IMPLANT
SUT ETHIBOND NAB CT1 #1 30IN (SUTURE) ×9 IMPLANT
SUT MNCRL AB 4-0 PS2 18 (SUTURE) ×3 IMPLANT
SUT SILK 2 0 TIES 17X18 (SUTURE)
SUT SILK 2-0 18XBRD TIE BLK (SUTURE) IMPLANT
SUT VIC AB 2-0 CT1 27 (SUTURE) ×2
SUT VIC AB 2-0 CT1 TAPERPNT 27 (SUTURE) ×1 IMPLANT
SYR 30ML LL (SYRINGE) IMPLANT
SYR CONTROL 10ML LL (SYRINGE) IMPLANT
TAPE LABRALWHITE 1.5X36 (TAPE) ×3 IMPLANT
TAPE SUT LABRALTAP WHT/BLK (SUTURE) ×3 IMPLANT
TOWEL OR 17X24 6PK STRL BLUE (TOWEL DISPOSABLE) IMPLANT
TOWEL OR 17X26 10 PK STRL BLUE (TOWEL DISPOSABLE) ×3 IMPLANT
TOWER SMARTMIX MINI (MISCELLANEOUS) ×1 IMPLANT

## 2016-09-07 NOTE — Transfer of Care (Signed)
Immediate Anesthesia Transfer of Care Note  Patient: Corey Adams.  Procedure(s) Performed: Procedure(s) with comments: TOTAL SHOULDER ARTHROPLASTY (Left) - Left total shoulder arthroplasty  Patient Location: PACU  Anesthesia Type:General and Regional  Level of Consciousness: awake, alert , oriented and patient cooperative  Airway & Oxygen Therapy: Patient Spontanous Breathing and Patient connected to nasal cannula oxygen  Post-op Assessment: Report given to RN and Post -op Vital signs reviewed and stable  Post vital signs: Reviewed and stable  Last Vitals:  Vitals:   09/07/16 1003 09/07/16 1254  BP: 120/67 (!) (P) 119/98  Pulse: (!) 58   Resp: 11 (!) (P) 8  Temp:  (P) 36.4 C    Last Pain:  Vitals:   09/07/16 0838  TempSrc:   PainSc: 2       Patients Stated Pain Goal: 2 (68/37/29 0211)  Complications: No apparent anesthesia complications

## 2016-09-07 NOTE — Progress Notes (Signed)
Dr. Ermalene Postin called to see when block would be completed

## 2016-09-07 NOTE — H&P (Signed)
Corey Adams. is an 72 y.o. male.   Chief Complaint: L shoulder pain and dysfunction HPI: Endstage L shoulder arthritis with significant pain and dysfunction, failed conservative measures.  Pain interferes with sleep and quality of life.   Past Medical History:  Diagnosis Date  . Actinic keratitis   . Alcohol abuse    history no drink since 2015  . Anxiety   . Arthritis    "left thumb" (03/05/2014)  . Childhood asthma   . Chronic lower back pain   . Complication of anesthesia    difficulty urinating after anesthesia  . Depression   . GERD (gastroesophageal reflux disease)   . Hyperlipidemia   . Hypertension   . Kidney stones    "plenty; no ORs" (03/05/2014)  . Migraine    "last one was in the 1990's" (03/05/2014)  . Multiple allergies    "sinus stays swollen all the time"  . Sleep apnea    "have mask; don't use it" (03/05/2014)    Past Surgical History:  Procedure Laterality Date  . CARPAL TUNNEL RELEASE Bilateral 1990's  . COLON SURGERY    . DISTAL CLAVICLE EXCISION Right 03/05/2014  . EXCISIONAL HEMORRHOIDECTOMY  1983  . LAPAROSCOPIC LOW ANTERIOR RESECTION N/A 10/30/2012   Procedure: LAPAROSCOPIC LOW ANTERIOR RESECTION with Rigid Proctoscopy;  Surgeon: Merrie Roof, MD;  Location: [place];  Service: General;  Laterality: N/A;  . NASAL SINUS SURGERY  1990's  . TOTAL SHOULDER ARTHROPLASTY Right 03/05/2014  . TOTAL SHOULDER ARTHROPLASTY Right 03/05/2014   Procedure: TOTAL SHOULDER ARTHROPLASTY;  Surgeon: Nita Sells, MD;  Location: [place];  Service: Orthopedics;  Laterality: Right;  Right shoulder replacement, distal clavicle excision  . VASECTOMY      Family History  Problem Relation Age of Onset  . Heart attack Mother   . Heart disease Father   . Heart attack Father   . Hypercholesterolemia Sister    Social History:  reports that he has never smoked. He has never used smokeless tobacco. He reports that he drinks alcohol. He reports that he uses drugs,  including Marijuana.  Allergies:  Allergies  Allergen Reactions  . Shellfish Allergy Anaphylaxis  . Guar Gum Hives  . Xanthan Gum Hives  . Codeine Nausea And Vomiting    Medications Prior to Admission  Medication Sig Dispense Refill  . aspirin EC 325 MG tablet Take 325 mg by mouth daily.     Marland Kitchen atorvastatin (LIPITOR) 10 MG tablet Take 10 mg by mouth at bedtime.     . bisoprolol-hydrochlorothiazide (ZIAC) 2.5-6.25 MG per tablet Take 1 tablet by mouth every evening.     . diphenhydrAMINE (BENADRYL) 25 MG tablet Take 25 mg by mouth at bedtime as needed for allergies.    Marland Kitchen escitalopram (LEXAPRO) 20 MG tablet Take 20 mg by mouth daily.    . mometasone (NASONEX) 50 MCG/ACT nasal spray Place 2 sprays into the nose 2 (two) times daily as needed (congestion).     Marland Kitchen omeprazole (PRILOSEC) 40 MG capsule Take 40 mg by mouth daily.     Marland Kitchen docusate sodium (COLACE) 100 MG capsule Take 1 capsule (100 mg total) by mouth 3 (three) times daily as needed. (Patient not taking: Reported on 09/01/2016) 20 capsule 0  . oxyCODONE-acetaminophen (ROXICET) 5-325 MG per tablet Take 1-2 tablets by mouth every 4 (four) hours as needed for severe pain. (Patient not taking: Reported on 09/01/2016) 60 tablet 0    No results found for this or  any previous visit (from the past 48 hour(s)). No results found.  Review of Systems  All other systems reviewed and are negative.   Blood pressure 120/82, pulse 60, temperature 98.2 F (36.8 C), temperature source Oral, resp. rate 20, SpO2 99 %. Physical Exam  Constitutional: He is oriented to person, place, and time. He appears well-developed and well-nourished.  HENT:  Head: Atraumatic.  Eyes: EOM are normal.  Cardiovascular: Intact distal pulses.   Respiratory: Effort normal.  Musculoskeletal:  L shoulder pain with limited ROM. NVID  Neurological: He is alert and oriented to person, place, and time.  Skin: Skin is warm and dry.  Psychiatric: He has a normal mood and  affect.     Assessment/Plan Endstage L shoulder arthritis with significant pain and dysfunction, failed conservative measures.  Pain interferes with sleep and quality of life. Plan L total shoulder replacement Risks / benefits of surgery discussed Consent on chart  NPO for OR Preop antibiotics   Nita Sells, MD 09/07/2016, 9:38 AM

## 2016-09-07 NOTE — Anesthesia Procedure Notes (Signed)
Procedure Name: Intubation Date/Time: 09/07/2016 10:24 AM Performed by: Shirlyn Goltz Pre-anesthesia Checklist: Patient identified, Emergency Drugs available, Suction available and Patient being monitored Patient Re-evaluated:Patient Re-evaluated prior to inductionOxygen Delivery Method: Circle system utilized Preoxygenation: Pre-oxygenation with 100% oxygen Intubation Type: IV induction Ventilation: Mask ventilation without difficulty Laryngoscope Size: Mac and 4 Grade View: Grade II Tube type: Oral Tube size: 7.5 mm Number of attempts: 1 Airway Equipment and Method: Stylet Placement Confirmation: ETT inserted through vocal cords under direct vision,  positive ETCO2 and breath sounds checked- equal and bilateral Secured at: 21 cm Tube secured with: Tape Dental Injury: Teeth and Oropharynx as per pre-operative assessment

## 2016-09-07 NOTE — Discharge Instructions (Signed)

## 2016-09-07 NOTE — Op Note (Signed)
Procedure(s): TOTAL SHOULDER ARTHROPLASTY Procedure Note  Corey Adams. male 71 y.o. 09/07/2016  Procedure(s) and Anesthesia Type:   LEFT TOTAL SHOULDER ARTHROPLASTY - Choice   LEFT biceps tenodesis  Surgeon(s) and Role:    Tania Ade, MD - Primary   Indications:  71 y.o. male  With endstage left shoulder arthritis. Pain and dysfunction interfered with quality of life and nonoperative treatment with activity modification, NSAIDS and injections failed.     Surgeon: Nita Sells   Assistants: Jeanmarie Hubert PA-C [facility] was present and scrubbed throughout the procedure and was essential in positioning, retraction, exposure, and closure)  Anesthesia: General endotracheal anesthesia with preoperative interscalene block given by the attending anesthesiologist    Procedure Detail  TOTAL SHOULDER ARTHROPLASTY  Findings: Tornier flex anatomic press-fit size 5 stem with a 50x19 head, cemented size M40 Cortiloc glenoid.  A lesser tuberosity osteotomy was performed and repaired at the conclusion of the procedure.  Estimated Blood Loss:  200 mL         Drains: None   Blood Given: none          Specimens: none        Complications:  * No complications entered in OR log *         Disposition: PACU - hemodynamically stable.         Condition: stable    Procedure:   The patient was identified in the preoperative holding area where I personally marked the operative extremity after verifying with the patient and consent. He  was taken to the operating room where He was transferred to the   operative table.  The patient received an interscalene block in   the holding area by the attending anesthesiologist.  General anesthesia was induced   in the operating room without complication.  The patient did receive IV  Ancef prior to the commencement of the procedure.  The patient was   placed in the beach-chair position with the back raised about 30   degrees.  The nonoperative extremity and head and neck were carefully   positioned and padded protecting against neurovascular compromise.  The   left upper extremity was then prepped and draped in the standard sterile   fashion.    The appropriate operative time-out was performed with   Anesthesia, the perioperative staff, as well as myself and we all agreed   that the left side was the correct operative site. The patient received 1 g IV tranexamic acid at the start of the case around time of the incision. An approximately   10 cm incision was made from the tip of the coracoid to the center point of the   humerus at the level of the axilla.  Dissection was carried down sharply   through subcutaneous tissues and cephalic vein was identified and taken   laterally with the deltoid.  The pectoralis major was taken medially.  The   upper 1 cm of the pectoralis major was released from its attachment on   the humerus.  The clavipectoral fascia was incised just lateral to the   conjoined tendon.  This incision was carried up to but not into the   coracoacromial ligament.  Digital palpation was used to prove   integrity of the axillary nerve which was protected throughout the   procedure.  Musculocutaneous nerve was not palpated in the operative   field.  Conjoined tendon was then retracted gently medially and the   deltoid laterally.  Anterior circumflex humeral vessels were clamped and   coagulated.  The soft tissues overlying the biceps was incised and this   incision was carried across the transverse humeral ligament to the base   of the coracoid.  The biceps was noted to be severely degenerated. It was released from the superior labrum. The biceps was then tenodesed to the soft tissue just above   pectoralis major and the remaining portion of the biceps superiorly was   excised.  An osteotomy was performed at the lesser tuberosity.  The capsule was then   released all the way down to the 6  o'clock position of the humeral head.   The humeral head was then delivered with simultaneous adduction,   extension and external rotation.  All humeral osteophytes were removed   and the anatomic neck of the humerus was marked and cut free hand at   approximately 25 degrees retroversion within about 3 mm of the cuff   reflection posteriorly.  The head size was estimated to be a 50 medium   offset.  At that point, the humeral head was retracted posteriorly with   a Fukuda retractor.   Remaining portion of the capsule was released at the base of the   coracoid.  The remaining biceps anchor and the entire anterior-inferior   labrum was excised.  The posterior labrum was also excised but the   posterior capsule was not released.  The guidepin was placed bicortically with 0 elevated guide.  The reamer was used to ream to concentric bone with punctate bleeding.  This gave an excellent concentric surface.  The center hole was then drilled for an anchor peg glenoid followed by the three peripheral holes and none of the holes   exited the glenoid wall.  I then pulse irrigated these holes and dried   them with Surgicel.  The three peripheral holes were then   pressurized cemented and the anchor peg glenoid was placed and impacted   with an excellent fit.  The glenoid was a 4M component.  The proximal humerus was then again exposed taking care not to displace the glenoid.    The entry awl was used followed by sounding reamers and then sequentially broached from size 3 to 5. This was then left in place and the calcar planer was used. Trial head was placed with a 50x19.  With the trial implantation of the component,  there was approximately 50% posterior translation with immediate snap back to the   anatomic position.  With forward elevation, there was no tendency   towards posterior subluxation.   The trial was removed and the final implant was prepared on a back table.  The trial was removed and the final  implant was prepared on a back table.   3 small holes were drilled on the medial side of the lesser tuberosity osteotomy, through which 2 labral tapes were passed. The implant was then placed through the loop of the 2 labral tapes and impacted with an excellent press-fit. This achieved excellent anatomic reconstruction of the proximal humerus.  The joint was then copiously irrigated with pulse lavage.  The subscapularis and   lesser tuberosity osteotomy were then repaired using the 2 labral tapes previously passed in a double row fashion with horizontal mattress sutures medially brought over through bone tunnels tied over a bone bridge laterally.   One #1 Ethibond was placed at the rotator interval just above   the lesser tuberosity. Copious irrigation was used.  Throughout the case a mixture of 20 mL liposomal bupivacaine and 40 mL normal saline was used to infiltrate the deep capsular tissue, bony surfaces and subcutaneous tissue. Skin was closed with 2-0 Vicryl sutures in the deep dermal layer and 4-0 Monocryl in a subcuticular  running fashion.  Sterile dressings were then applied including Aquacel.  The patient was placed in a sling and allowed to awaken from general anesthesia and taken to the recovery room in stable condition.      POSTOPERATIVE PLAN:  Early passive range of motion will be allowed with the goal of 0 degrees external rotation and 90 degrees forward elevation.  No internal rotation at this time.  No active motion of the arm until the lesser tuberosity heals.  The patient will likely be kept in the hospital for 1-2 days and then discharged home.

## 2016-09-08 ENCOUNTER — Encounter (HOSPITAL_COMMUNITY): Payer: Self-pay | Admitting: Orthopedic Surgery

## 2016-09-08 LAB — CBC
HEMATOCRIT: 30 % — AB (ref 39.0–52.0)
Hemoglobin: 9.9 g/dL — ABNORMAL LOW (ref 13.0–17.0)
MCH: 30.7 pg (ref 26.0–34.0)
MCHC: 33 g/dL (ref 30.0–36.0)
MCV: 93.2 fL (ref 78.0–100.0)
Platelets: 127 10*3/uL — ABNORMAL LOW (ref 150–400)
RBC: 3.22 MIL/uL — ABNORMAL LOW (ref 4.22–5.81)
RDW: 13.5 % (ref 11.5–15.5)
WBC: 4.3 10*3/uL (ref 4.0–10.5)

## 2016-09-08 LAB — BASIC METABOLIC PANEL
ANION GAP: 6 (ref 5–15)
BUN: 11 mg/dL (ref 6–20)
CALCIUM: 6.6 mg/dL — AB (ref 8.9–10.3)
CO2: 20 mmol/L — AB (ref 22–32)
Chloride: 113 mmol/L — ABNORMAL HIGH (ref 101–111)
Creatinine, Ser: 1.06 mg/dL (ref 0.61–1.24)
GFR calc Af Amer: >60 mL/min (ref >60)
GFR calc non Af Amer: >60 mL/min (ref >60)
GLUCOSE: 99 mg/dL (ref 65–99)
POTASSIUM: 3 mmol/L — AB (ref 3.5–5.1)
Sodium: 139 mmol/L (ref 135–145)

## 2016-09-08 MED ORDER — BUPIVACAINE-EPINEPHRINE (PF) 0.5% -1:200000 IJ SOLN
INTRAMUSCULAR | Status: DC | PRN
  Administered 2016-09-07: 30 mL via PERINEURAL

## 2016-09-08 MED ORDER — OXYCODONE-ACETAMINOPHEN 5-325 MG PO TABS: 1.0000 | ORAL_TABLET | ORAL | 0 refills | Status: DC | PRN

## 2016-09-08 NOTE — Evaluation (Signed)
Occupational Therapy Evaluation and Discharge Patient Details Name: Corey Adams. MRN: [id number] DOB: 04/25/1945 Today's Date: 09/08/2016    History of Present Illness LEFT TOTAL SHOULDER ARTHROPLASTY    Clinical Impression   This 71 yo male admitted and underwent above presents to acute OT with all education completed, we will D/C from acute OT.    Follow Up Recommendations  DC plan and follow up therapy as arranged by surgeon    Equipment Recommendations  None recommended by OT       Precautions / Restrictions Precautions Precautions: Shoulder Shoulder Interventions: Shoulder sling/immobilizer;Off for dressing/bathing/exercises Precaution Comments: Passive protocol (no abduction, FF to 90 degrees, and external rotation to neutral) Required Braces or Orthoses: Sling Restrictions Weight Bearing Restrictions: Yes LUE Weight Bearing: Non weight bearing      Mobility Bed Mobility Overal bed mobility: Independent                Transfers Overall transfer level: Independent                        ADL either performed or assessed with clinical judgement         Vision Patient Visual Report: No change from baseline              Pertinent Vitals/Pain Pain Assessment: 0-10 Pain Score: 2  Pain Location: left shoulder Pain Descriptors / Indicators: Sore Pain Intervention(s): Limited activity within patient's tolerance;Repositioned     Hand Dominance Right   Extremity/Trunk Assessment Upper Extremity Assessment Upper Extremity Assessment: LUE deficits/detail LUE Deficits / Details: shoulder sx this admission; elbow distally WNL LUE Coordination: decreased gross motor           Communication Communication Communication: No difficulties   Cognition Arousal/Alertness: Awake/alert Behavior During Therapy: WFL for tasks assessed/performed Overall Cognitive Status: Within Functional Limits for tasks assessed                                     General Comments       Exercises Other Exercises Other Exercises: pt returned demonstrated 10 reps of elbow flexion/extension, ,forearm supination/pronation, wrist flexion/extension, and finger composite flexion/extension Other Exercises: Wife did PROM for pt shoulder flexion to 45 degrees today (with goal 990 degrees) and neutral external rotation (pt achieved this today). These were done in supine   Shoulder Instructions Shoulder Instructions Donning/doffing shirt without moving shoulder: Caregiver independent with task Method for sponge bathing under operated UE:  (verbalized understanding) Donning/doffing sling/immobilizer: Caregiver independent with task Correct positioning of sling/immobilizer: Caregiver independent with task Pendulum exercises (written home exercise program):  (NA) ROM for elbow, wrist and digits of operated UE: Independent Sling wearing schedule (on at all times/off for ADL's):  (verbalized understanding) Proper positioning of operated UE when showering:  (verbalized understanding) Dressing change:  (na) Positioning of UE while sleeping:  (verbalized understanding)    [place] expects to be discharged to:: Private residence Living Arrangements: Spouse/significant other Available Help at Discharge: Family;Available 24 hours/day Type of Home: House Home Access: Level entry     Home Layout: One level                          Prior Functioning/Environment Level of Independence: Independent                 OT Problem List: Decreased  range of motion;Impaired UE functional use;Pain         OT Goals(Current goals can be found in the care plan section) Acute Rehab OT Goals Patient Stated Goal: home today  OT Frequency:                AM-PAC PT "6 Clicks" Daily Activity     Outcome Measure Help from another person eating meals?: A Little Help from another person taking care of personal grooming?:  A Little Help from another person toileting, which includes using toliet, bedpan, or urinal?: A Little Help from another person bathing (including washing, rinsing, drying)?: A Little Help from another person to put on and taking off regular upper body clothing?: A Little Help from another person to put on and taking off regular lower body clothing?: A Little 6 Click Score: 18   End of Session Equipment Utilized During Treatment:  (sling) Nurse Communication:  (pt ready to go from therapy standpoint)  Activity Tolerance: Patient tolerated treatment well Patient left:  (up in room with wife)  OT Visit Diagnosis: Pain Pain - Right/Left: Left Pain - part of body: Shoulder                Time: 6789-3810 OT Time Calculation (min): 25 min Charges:  OT General Charges $OT Visit: 1 Procedure OT Evaluation $OT Eval Moderate Complexity: 1 Procedure OT Treatments $Self Care/Home Management : 8-22 mins [place], OTR/L [phone] 09/08/2016

## 2016-09-08 NOTE — Anesthesia Preprocedure Evaluation (Signed)
Anesthesia Evaluation  Patient identified by MRN, date of birth, ID band  Reviewed: Allergy & Precautions, H&P , NPO status , Patient's Chart, lab work & pertinent test results, reviewed documented beta blocker date and time   Airway Mallampati: II  TM Distance: >3 FB Neck ROM: Full    Dental  (+) Teeth Intact, Dental Advisory Given   Pulmonary asthma , sleep apnea ,    breath sounds clear to auscultation       Cardiovascular hypertension, Pt. on medications  Rhythm:Regular     Neuro/Psych  Headaches, PSYCHIATRIC DISORDERS Anxiety Depression    GI/Hepatic GERD  ,  Endo/Other    Renal/GU Renal disease     Musculoskeletal  (+) Arthritis ,   Abdominal (+)  Abdomen: soft.    Peds  Hematology   Anesthesia Other Findings   Reproductive/Obstetrics                             Anesthesia Physical Anesthesia Plan  ASA: III  Anesthesia Plan: General and Regional   Post-op Pain Management:  Regional for Post-op pain   Induction: Intravenous  PONV Risk Score and Plan: 2 and Ondansetron, Dexamethasone and Treatment may vary due to age or medical condition  Airway Management Planned: Oral ETT  Additional Equipment: None  Intra-op Plan:   Post-operative Plan: Extubation in OR  Informed Consent: I have reviewed the patients History and Physical, chart, labs and discussed the procedure including the risks, benefits and alternatives for the proposed anesthesia with the patient or authorized representative who has indicated his/her understanding and acceptance.   Dental advisory given  Plan Discussed with: CRNA and Surgeon  Anesthesia Plan Comments:         Anesthesia Quick Evaluation

## 2016-09-08 NOTE — Discharge Summary (Signed)
Patient ID: Corey Adams. MRN: [id number] DOB/AGE: 04-Jan-1946 71 y.o.  Admit date: 09/07/2016 Discharge date: 09/08/2016  Admission Diagnoses:  Active Problems:   S/P shoulder replacement, left   Discharge Diagnoses:  Same  Past Medical History:  Diagnosis Date  . Actinic keratitis   . Alcohol abuse    history no drink since 2015  . Anxiety   . Arthritis    "left thumb" (03/05/2014)  . Childhood asthma   . Chronic lower back pain   . Complication of anesthesia    difficulty urinating after anesthesia  . Depression   . GERD (gastroesophageal reflux disease)   . Hyperlipidemia   . Hypertension   . Kidney stones    "plenty; no ORs" (03/05/2014)  . Migraine    "last one was in the 1990's" (03/05/2014)  . Multiple allergies    "sinus stays swollen all the time"  . Sleep apnea    "have mask; don't use it" (03/05/2014)    Surgeries: Procedure(s): TOTAL SHOULDER ARTHROPLASTY on 09/07/2016   Consultants:   Discharged Condition: Improved  Hospital Course: Corey Adams. is an 71 y.o. male who was admitted 09/07/2016 for operative treatment of left shoulder osteoarthritis. Patient has severe unremitting pain that affects sleep, daily activities, and work/hobbies. After pre-op clearance the patient was taken to the operating room on 09/07/2016 and underwent  Procedure(s): TOTAL SHOULDER ARTHROPLASTY.    Patient was given perioperative antibiotics: Anti-infectives    Start     Dose/Rate Route Frequency Ordered Stop   09/07/16 1630  ceFAZolin (ANCEF) IVPB 2g/100 mL premix     2 g 200 mL/hr over 30 Minutes Intravenous Every 6 hours 09/07/16 1416 09/08/16 0605   09/07/16 1000  ceFAZolin (ANCEF) IVPB 2g/100 mL premix     2 g 200 mL/hr over 30 Minutes Intravenous To ShortStay [facility] 09/06/16 1040 09/07/16 1025       Patient was given sequential compression devices, early ambulation, and asa to prevent DVT.  Patient benefited maximally from hospital stay and there were no  complications.    Recent vital signs: Patient Vitals for the past 24 hrs:  BP Temp Temp src Pulse Resp SpO2 Height Weight  09/08/16 0454 106/60 99.3 F (37.4 C) Oral 79 16 94 % - -  09/08/16 0035 (!) 106/56 99.5 F (37.5 C) Oral 81 16 97 % - -  09/07/16 2059 115/60 98.8 F (37.1 C) Oral 74 19 97 % - -  09/07/16 1408 109/65 97.7 F (36.5 C) Oral 80 - 96 % - -  09/07/16 1339 114/68 97.7 F (36.5 C) - 74 10 97 % - -  09/07/16 1325 119/71 - - 79 19 100 % - -  09/07/16 1320 - - - 75 10 97 % - -  09/07/16 1310 125/70 - - 82 12 98 % - -  09/07/16 1254 (!) 119/98 97.5 F (36.4 C) - 90 (!) 8 96 % - -  09/07/16 1003 120/67 - - (!) 58 11 99 % - -  09/07/16 0958 113/63 - - (!) 57 (!) 9 99 % - -  09/07/16 0955 121/69 - - (!) 59 (!) 9 99 % 5\' 7"  (1.702 m) 87.9 kg (193 lb 11.2 oz)  09/07/16 0837 120/82 98.2 F (36.8 C) Oral 60 20 99 % - -     Recent laboratory studies:  Recent Labs  09/08/16 0659  WBC 4.3  HGB 9.9*  HCT 30.0*  PLT 127*     Discharge  Medications:   Allergies as of 09/08/2016      Reactions   Shellfish Allergy Anaphylaxis   Guar Gum Hives   Xanthan Gum Hives   Codeine Nausea And Vomiting      Medication List    TAKE these medications   aspirin EC 325 MG tablet Take 325 mg by mouth daily.   atorvastatin 10 MG tablet Commonly known as:  LIPITOR Take 10 mg by mouth at bedtime.   bisoprolol-hydrochlorothiazide 2.5-6.25 MG tablet Commonly known as:  ZIAC Take 1 tablet by mouth every evening.   diphenhydrAMINE 25 MG tablet Commonly known as:  BENADRYL Take 25 mg by mouth at bedtime as needed for allergies.   docusate sodium 100 MG capsule Commonly known as:  COLACE Take 1 capsule (100 mg total) by mouth 3 (three) times daily as needed.   escitalopram 20 MG tablet Commonly known as:  LEXAPRO Take 20 mg by mouth daily.   mometasone 50 MCG/ACT nasal spray Commonly known as:  NASONEX Place 2 sprays into the nose 2 (two) times daily as needed  (congestion).   omeprazole 40 MG capsule Commonly known as:  PRILOSEC Take 40 mg by mouth daily.   oxyCODONE-acetaminophen 5-325 MG tablet Commonly known as:  ROXICET Take 1-2 tablets by mouth every 4 (four) hours as needed for severe pain.       Diagnostic Studies: Dg Chest 2 View  Result Date: 09/04/2016 CLINICAL DATA:  Pre left shoulder replacement evaluation. EXAM: CHEST  2 VIEW COMPARISON:  02/20/2014. FINDINGS: Normal sized heart. Tortuous aorta. Clear lungs with normal vascularity. Mild flattening of the hemidiaphragms. Small hiatal hernia. Thoracic spine degenerative changes. Right shoulder prosthesis. Left shoulder degenerative changes and sub coracoid loose body. IMPRESSION: 1. Minimal changes of COPD. 2. Small hiatal hernia. 3. Left shoulder degenerative changes and loose body. Electronically Signed   By: Claudie Revering M.D.   On: 09/04/2016 18:41   Ct Shoulder Left Wo Contrast  Result Date: 08/24/2016 CLINICAL DATA:  Left shoulder osteoarthritis with 3 years. Pain worse over the last 4 months. EXAM: CT OF THE UPPER LEFT EXTREMITY WITHOUT CONTRAST TECHNIQUE: Multidetector CT imaging of the upper left extremity was performed according to the standard protocol. COMPARISON:  None. FINDINGS: Bones/Joint/Cartilage No fracture or dislocation. Normal alignment. No joint effusion. Severe osteoarthritis of the left glenohumeral joint with severe joint space narrowing with a bone-on-bone appearance, subchondral sclerosis and marginal osteophytosis. Numerous loose bodies in the subcoracoid recess. Moderate arthropathy of the acromioclavicular joint. Type II acromion. No aggressive lytic or sclerotic osseous lesion. Partially visualize is degenerative disc disease with disc height loss at C5-6 and C6-7 with left uncovertebral degenerative changes and left foraminal narrowing. Ligaments Ligaments are suboptimally evaluated by CT. Muscles and Tendons Muscles are normal. No muscle atrophy. No  intramuscular fluid collection or hematoma. Soft tissue No fluid collection or hematoma.  No soft tissue mass. IMPRESSION: 1. Severe osteoarthritis of the left glenohumeral joint. Multiple loose bodies in the subcoracoid recess. 2. Partially visualize is degenerative disc disease with disc height loss at C5-6 and C6-7 with left uncovertebral degenerative changes and left foraminal narrowing. Electronically Signed   By: Kathreen Devoid   On: 08/24/2016 11:58   Dg Shoulder Left Port  Result Date: 09/07/2016 CLINICAL DATA:  Status post total left shoulder replacement EXAM: LEFT SHOULDER - 1 VIEW COMPARISON:  None. FINDINGS: The patient is status post left shoulder replacement. Hardware is in good position. No acute fractures are seen. Limited views of the  left chest are unremarkable. IMPRESSION: Status post left shoulder replacement as above. Electronically Signed   By: Dorise Bullion III M.D   On: 09/07/2016 13:21    Disposition: 01-Home or Self Care  Discharge Instructions    Call MD / Call 911    Complete by:  As directed    If you experience chest pain or shortness of breath, CALL 911 and be transported to the hospital emergency room.  If you develope a fever above 101 F, pus (white drainage) or increased drainage or redness at the wound, or calf pain, call your surgeon's office.   Constipation Prevention    Complete by:  As directed    Drink plenty of fluids.  Prune juice may be helpful.  You may use a stool softener, such as Colace (over the counter) 100 mg twice a day.  Use MiraLax (over the counter) for constipation as needed.   Diet - low sodium heart healthy    Complete by:  As directed    Increase activity slowly as tolerated    Complete by:  As directed       Follow-up Information    Tania Ade, MD. Schedule an appointment as soon as possible for a visit in 2 weeks.   Specialty:  Orthopedic Surgery Contact information: [address] [place]  [address] [phone]            Signed: Grier Mitts 09/08/2016, 8:23 AM

## 2016-09-08 NOTE — Progress Notes (Signed)
   PATIENT ID: Corey Adams.   1 Day Post-Op Procedure(s) (LRB): TOTAL SHOULDER ARTHROPLASTY (Left)  Subjective: Reports doing well, sitting up eating this am. Pain well controlled. No complaints of dizziness, lightheadedness. Feels like he can go home today.  Objective:  Vitals:   09/08/16 0035 09/08/16 0454  BP: (!) 106/56 106/60  Pulse: 81 79  Resp: 16 16  Temp: 99.5 F (37.5 C) 99.3 F (37.4 C)      L UE dressing c/d/i Wiggles fingers, distally NVI  Labs:   Recent Labs  09/08/16 0659  HGB 9.9*   Recent Labs  09/08/16 0659  WBC 4.3  RBC 3.22*  HCT 30.0*  PLT 127*  No results for input(s): NA, K, CL, CO2, BUN, CREATININE, GLUCOSE, CALCIUM in the last 72 hours.  Assessment and Plan: 1 day s/p L TSA Continue pain mgmt Sling OT- PROM FF goal to 90, ER neutral D/c today when cleared by OT Scripts in chart Fu with Dr. Tamera Punt in 2 weeks  VTE proph: asa, scds

## 2016-09-08 NOTE — Plan of Care (Signed)
Problem: Safety: Goal: Ability to remain free from injury will improve Patient has been instructed on the appropriate use of the call bell and uses is accordingly. Patient voices understanding

## 2016-09-08 NOTE — Anesthesia Postprocedure Evaluation (Signed)
Anesthesia Post Note  Patient: Corey Adams.  Procedure(s) Performed: Procedure(s) (LRB): TOTAL SHOULDER ARTHROPLASTY (Left)     Patient location during evaluation: PACU Anesthesia Type: Regional and General Level of consciousness: awake and alert Pain management: pain level controlled Vital Signs Assessment: post-procedure vital signs reviewed and stable Respiratory status: spontaneous breathing, nonlabored ventilation, respiratory function stable and patient connected to nasal cannula oxygen Cardiovascular status: stable Postop Assessment: no signs of nausea or vomiting Anesthetic complications: no    Last Vitals:  Vitals:   09/08/16 0035 09/08/16 0454  BP: (!) 106/56 106/60  Pulse: 81 79  Resp: 16 16  Temp: 37.5 C 37.4 C    Last Pain:  Vitals:   09/08/16 0454  TempSrc: Oral  PainSc:                  Nahlia Hellmann

## 2016-09-08 NOTE — Anesthesia Procedure Notes (Signed)
Anesthesia Regional Block: Interscalene brachial plexus block   Pre-Anesthetic Checklist: ,, timeout performed, Correct Patient, Correct Site, Correct Laterality, Correct Procedure, Correct Position, site marked, Risks and benefits discussed,  Surgical consent,  Pre-op evaluation,  At surgeon's request and post-op pain management  Laterality: Upper and Left  Prep: chloraprep       Needles:  Injection technique: Single-shot  Needle Type: Echogenic Stimulator Needle          Additional Needles:   Procedures: ultrasound guided,,,,,,,,  Narrative:  Start time: 09/07/2016 10:01 AM End time: 09/07/2016 10:09 AM Injection made incrementally with aspirations every 5 mL.  Performed by: Personally  Anesthesiologist: Tyrease Vandeberg  Additional Notes: H+P and labs reviewed, risks and benefits discussed with patient, procedure tolerated well without complications

## 2019-01-22 IMAGING — DX DG SHOULDER 1V*L*
1 series · 1 of 1 positions shown · non-contrast
Comparison: None.

CLINICAL DATA: Status post total left shoulder replacement

EXAM:
LEFT SHOULDER - 1 VIEW

[shoulder ap]
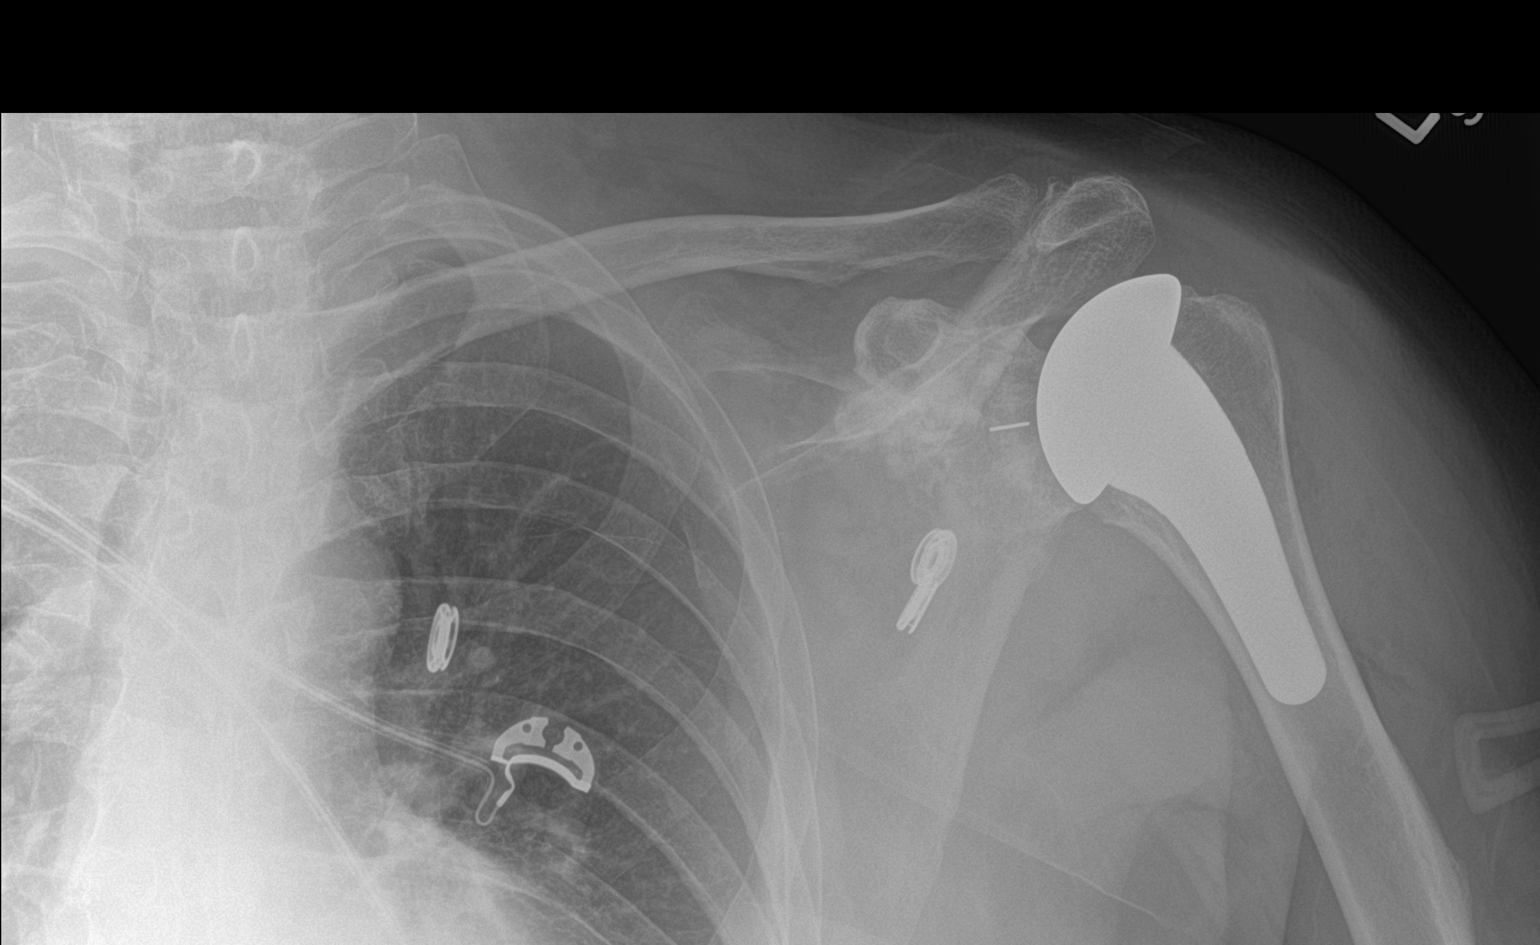

[1 of 1 positions shown; findings below may reference images not displayed]

FINDINGS: The patient is status post left shoulder replacement. Hardware is in
good position. No acute fractures are seen. Limited views of the
left chest are unremarkable.
IMPRESSION: Status post left shoulder replacement as above.

## 2019-07-02 ENCOUNTER — Other Ambulatory Visit: Payer: Self-pay | Admitting: Orthopaedic Surgery

## 2019-07-24 ENCOUNTER — Ambulatory Visit (HOSPITAL_COMMUNITY)
Admission: RE | Admit: 2019-07-24 | Discharge: 2019-07-24 | Disposition: A | Discharge status: Home / Self Care | Payer: Medicare PPO | Source: Ambulatory Visit | Attending: Orthopaedic Surgery | Admitting: Orthopaedic Surgery

## 2019-07-24 ENCOUNTER — Encounter (HOSPITAL_COMMUNITY)
Admission: RE | Admit: 2019-07-24 | Discharge: 2019-07-24 | Disposition: A | Discharge status: Home / Self Care | Payer: Medicare PPO | Source: Ambulatory Visit | Attending: Orthopaedic Surgery | Admitting: Orthopaedic Surgery

## 2019-07-24 ENCOUNTER — Other Ambulatory Visit: Payer: Self-pay

## 2019-07-24 ENCOUNTER — Encounter (HOSPITAL_COMMUNITY): Payer: Self-pay

## 2019-07-24 DIAGNOSIS — K449 Diaphragmatic hernia without obstruction or gangrene: Secondary | ICD-10-CM | POA: Insufficient documentation

## 2019-07-24 DIAGNOSIS — J984 Other disorders of lung: Secondary | ICD-10-CM | POA: Insufficient documentation

## 2019-07-24 DIAGNOSIS — Z01818 Encounter for other preprocedural examination: Secondary | ICD-10-CM | POA: Diagnosis not present

## 2019-07-24 DIAGNOSIS — I498 Other specified cardiac arrhythmias: Secondary | ICD-10-CM | POA: Diagnosis not present

## 2019-07-24 HISTORY — DX: Prediabetes: R73.03

## 2019-07-24 LAB — BASIC METABOLIC PANEL
Anion gap: 9 (ref 5–15)
BUN: 19 mg/dL (ref 8–23)
CO2: 24 mmol/L (ref 22–32)
Calcium: 9.3 mg/dL (ref 8.9–10.3)
Chloride: 107 mmol/L (ref 98–111)
Creatinine, Ser: 1.45 mg/dL — ABNORMAL HIGH (ref 0.61–1.24)
GFR calc Af Amer: 55 mL/min — ABNORMAL LOW (ref >60)
GFR calc non Af Amer: 48 mL/min — ABNORMAL LOW (ref >60)
Glucose, Bld: 108 mg/dL — ABNORMAL HIGH (ref 70–99)
Potassium: 4.2 mmol/L (ref 3.5–5.1)
Sodium: 140 mmol/L (ref 135–145)

## 2019-07-24 LAB — PROTIME-INR
INR: 0.9 (ref 0.8–1.2)
Prothrombin Time: 12.5 seconds (ref 11.4–15.2)

## 2019-07-24 LAB — CBC WITH DIFFERENTIAL/PLATELET
Abs Immature Granulocytes: 0.01 10*3/uL (ref 0.00–0.07)
Basophils Absolute: 0.1 10*3/uL (ref 0.0–0.1)
Basophils Relative: 1 %
Eosinophils Absolute: 0.3 10*3/uL (ref 0.0–0.5)
Eosinophils Relative: 4 %
HCT: 44.4 % (ref 39.0–52.0)
Hemoglobin: 14.7 g/dL (ref 13.0–17.0)
Immature Granulocytes: 0 %
Lymphocytes Relative: 36 %
Lymphs Abs: 2.8 10*3/uL (ref 0.7–4.0)
MCH: 31 pg (ref 26.0–34.0)
MCHC: 33.1 g/dL (ref 30.0–36.0)
MCV: 93.7 fL (ref 80.0–100.0)
Monocytes Absolute: 0.6 10*3/uL (ref 0.1–1.0)
Monocytes Relative: 7 %
Neutro Abs: 4 10*3/uL (ref 1.7–7.7)
Neutrophils Relative %: 52 %
Platelets: 221 10*3/uL (ref 150–400)
RBC: 4.74 MIL/uL (ref 4.22–5.81)
RDW: 12.9 % (ref 11.5–15.5)
WBC: 7.6 10*3/uL (ref 4.0–10.5)
nRBC: 0 % (ref 0.0–0.2)

## 2019-07-24 LAB — URINALYSIS, ROUTINE W REFLEX MICROSCOPIC
Bacteria, UA: NONE SEEN
Bilirubin Urine: NEGATIVE
Glucose, UA: NEGATIVE mg/dL
Ketones, ur: NEGATIVE mg/dL
Nitrite: NEGATIVE
Protein, ur: NEGATIVE mg/dL
Specific Gravity, Urine: 1.02 (ref 1.005–1.030)
pH: 5 (ref 5.0–8.0)

## 2019-07-24 LAB — ABO/RH: ABO/RH(D): O POS

## 2019-07-24 LAB — HEMOGLOBIN A1C
Hgb A1c MFr Bld: 6.1 % — ABNORMAL HIGH (ref 4.8–5.6)
Mean Plasma Glucose: 128.37 mg/dL

## 2019-07-24 LAB — APTT: aPTT: 36 seconds (ref 24–36)

## 2019-07-24 LAB — SURGICAL PCR SCREEN
MRSA, PCR: NEGATIVE
Staphylococcus aureus: POSITIVE — AB

## 2019-07-24 NOTE — Progress Notes (Signed)
PCP - Dr. Donald Prose Cardiologist -   Chest x-ray -  EKG -  Stress Test -  ECHO -  Cardiac Cath -   Sleep Study -  CPAP -   Fasting Blood Sugar -  Checks Blood Sugar _____ times a day  Blood Thinner Instructions: Aspirin Instructions:To hold aspirin 325 mg. A week before surgery. Last Dose:   Anesthesia review:   Patient denies shortness of breath, fever, cough and chest pain at PAT appointment   Patient verbalized understanding of instructions that were given to them at the PAT appointment. Patient was also instructed that they will need to review over the PAT instructions again at home before surgery.

## 2019-07-24 NOTE — Patient Instructions (Signed)
DUE TO COVID-19 ONLY ONE VISITOR IS ALLOWED TO COME WITH YOU AND STAY IN THE WAITING ROOM ONLY DURING PRE OP AND PROCEDURE DAY OF SURGERY. THE 1 VISITOR MAY VISIT WITH YOU AFTER SURGERY IN YOUR PRIVATE ROOM DURING VISITING HOURS ONLY!  YOU NEED TO HAVE A COVID 19 TEST ON: 08/01/19 @ 2:40 pm  , THIS TEST MUST BE DONE BEFORE SURGERY, COME  [place], [address] [place] , [id number].  ([place]) ONCE YOUR COVID TEST IS COMPLETED, PLEASE BEGIN THE QUARANTINE INSTRUCTIONS AS OUTLINED IN YOUR HANDOUT.                Corey Adams.   Your procedure is scheduled on: 08/05/19   Report to [facility] Main  Entrance   Report to admitting at: 7:15 AM     Call this number if you have problems the morning of surgery [phone]    Remember:    [place], NO [place].     Take these medicines the morning of surgery with A SIP OF WATER: lexapro,omeprazole.Use nasal spray as usual.                                 You may not have any metal on your body including hair pins and              piercings  Do not wear jewelry, lotions, powders or perfumes, deodorant             Men may shave face and neck.   Do not bring valuables to the hospital. [place].  Contacts, dentures or bridgework may not be worn into surgery.  Leave suitcase in the car. After surgery it may be brought to your room.     Patients discharged the day of surgery will not be allowed to drive home. IF YOU ARE HAVING SURGERY AND GOING HOME THE SAME DAY, YOU MUST HAVE AN ADULT TO DRIVE YOU HOME AND BE WITH YOU FOR 24 HOURS. YOU MAY GO HOME BY TAXI OR UBER OR ORTHERWISE, BUT AN ADULT MUST ACCOMPANY YOU HOME AND STAY WITH YOU FOR 24 HOURS.  Name and phone number of your driver:  Special Instructions: N/A              Please read over the following fact sheets you were  given: _____________________________________________________________________             NO SOLID FOOD AFTER MIDNIGHT THE NIGHT PRIOR TO SURGERY. NOTHING BY MOUTH EXCEPT CLEAR LIQUIDS UNTIL: 6:45 am . PLEASE FINISH ENSURE DRINK PER SURGEON ORDER  WHICH NEEDS TO BE COMPLETED AT: 6:45 am .   CLEAR LIQUID DIET   Foods Allowed                                                                     Foods Excluded  Coffee and tea, regular and decaf  liquids that you cannot  Plain Jell-O any favor except red or purple                                           see through such as: Fruit ices (not with fruit pulp)                                     milk, soups, orange juice  Iced Popsicles                                    All solid food Carbonated beverages, regular and diet                                    Cranberry, grape and apple juices Sports drinks like Gatorade Lightly seasoned clear broth or consume(fat free) Sugar, honey syrup  Sample Menu Breakfast                                Lunch                                     Supper Cranberry juice                    Beef broth                            Chicken broth Jell-O                                     Grape juice                           Apple juice Coffee or tea                        Jell-O                                      Popsicle                                                Coffee or tea                        Coffee or tea  _____________________________________________________________________  [facility] - Preparing for Surgery Before surgery, you can play an important role.  Because skin is not sterile, your skin needs to be as free of germs as possible.  You can reduce the number of germs on your skin by washing with CHG (chlorahexidine gluconate) soap before surgery.  CHG is an antiseptic cleaner which kills  germs and bonds with the skin to continue killing germs even after  washing. Please DO NOT use if you have an allergy to CHG or antibacterial soaps.  If your skin becomes reddened/irritated stop using the CHG and inform your nurse when you arrive at Short Stay. Do not shave (including legs and underarms) for at least 48 hours prior to the first CHG shower.  You may shave your face/neck. Please follow these instructions carefully:  1.  Shower with CHG Soap the night before surgery and the  morning of Surgery.  2.  If you choose to wash your hair, wash your hair first as usual with your  normal  shampoo.  3.  After you shampoo, rinse your hair and body thoroughly to remove the  shampoo.                           4.  Use CHG as you would any other liquid soap.  You can apply chg directly  to the skin and wash                       Gently with a scrungie or clean washcloth.  5.  Apply the CHG Soap to your body ONLY FROM THE NECK DOWN.   Do not use on face/ open                           Wound or open sores. Avoid contact with eyes, ears mouth and genitals (private parts).                       Wash face,  Genitals (private parts) with your normal soap.             6.  Wash thoroughly, paying special attention to the area where your surgery  will be performed.  7.  Thoroughly rinse your body with warm water from the neck down.  8.  DO NOT shower/wash with your normal soap after using and rinsing off  the CHG Soap.                9.  Pat yourself dry with a clean towel.            10.  Wear clean pajamas.            11.  Place clean sheets on your bed the night of your first shower and do not  sleep with pets. Day of Surgery : Do not apply any lotions/deodorants the morning of surgery.  Please wear clean clothes to the hospital/surgery center.  FAILURE TO FOLLOW THESE INSTRUCTIONS MAY RESULT IN THE CANCELLATION OF YOUR SURGERY PATIENT SIGNATURE_________________________________  NURSE  SIGNATURE__________________________________  ________________________________________________________________________   Corey Adams  An incentive spirometer is a tool that can help keep your lungs clear and active. This tool measures how well you are filling your lungs with each breath. Taking long deep breaths may help reverse or decrease the chance of developing breathing (pulmonary) problems (especially infection) following:  A long period of time when you are unable to move or be active. BEFORE THE PROCEDURE   If the spirometer includes an indicator to show your best effort, your nurse or respiratory therapist will set it to a desired goal.  If possible, sit up straight or lean slightly forward. Try not to slouch.  Hold the incentive spirometer in an  upright position. INSTRUCTIONS FOR USE  1. Sit on the edge of your bed if possible, or sit up as far as you can in bed or on a chair. 2. Hold the incentive spirometer in an upright position. 3. Breathe out normally. 4. Place the mouthpiece in your mouth and seal your lips tightly around it. 5. Breathe in slowly and as deeply as possible, raising the piston or the ball toward the top of the column. 6. Hold your breath for 3-5 seconds or for as long as possible. Allow the piston or ball to fall to the bottom of the column. 7. Remove the mouthpiece from your mouth and breathe out normally. 8. Rest for a few seconds and repeat Steps 1 through 7 at least 10 times every 1-2 hours when you are awake. Take your time and take a few normal breaths between deep breaths. 9. The spirometer may include an indicator to show your best effort. Use the indicator as a goal to work toward during each repetition. 10. After each set of 10 deep breaths, practice coughing to be sure your lungs are clear. If you have an incision (the cut made at the time of surgery), support your incision when coughing by placing a pillow or rolled up towels firmly  against it. Once you are able to get out of bed, walk around indoors and cough well. You may stop using the incentive spirometer when instructed by your caregiver.  RISKS AND COMPLICATIONS  Take your time so you do not get dizzy or light-headed.  If you are in pain, you may need to take or ask for pain medication before doing incentive spirometry. It is harder to take a deep breath if you are having pain. AFTER USE  Rest and breathe slowly and easily.  It can be helpful to keep track of a log of your progress. Your caregiver can provide you with a simple table to help with this. If you are using the spirometer at home, follow these instructions: [place] IF:   You are having difficultly using the spirometer.  You have trouble using the spirometer as often as instructed.  Your pain medication is not giving enough relief while using the spirometer.  You develop fever of 100.5 F (38.1 C) or higher. SEEK IMMEDIATE MEDICAL CARE IF:   You cough up bloody sputum that had not been present before.  You develop fever of 102 F (38.9 C) or greater.  You develop worsening pain at or near the incision site. MAKE SURE YOU:   Understand these instructions.  Will watch your condition.  Will get help right away if you are not doing well or get worse. Document Released: 07/31/2006 Document Revised: 06/12/2011 Document Reviewed: 10/01/2006 [facility] Patient Information 2014 [place], [place].   ________________________________________________________________________

## 2019-07-25 NOTE — Progress Notes (Signed)
PCR result routed to Dr. Rhona Raider for review.

## 2019-08-01 ENCOUNTER — Other Ambulatory Visit (HOSPITAL_COMMUNITY)
Admission: RE | Admit: 2019-08-01 | Discharge: 2019-08-01 | Disposition: A | Discharge status: Home / Self Care | Payer: Medicare PPO | Source: Ambulatory Visit | Attending: Orthopaedic Surgery | Admitting: Orthopaedic Surgery

## 2019-08-01 DIAGNOSIS — Z20822 Contact with and (suspected) exposure to covid-19: Secondary | ICD-10-CM | POA: Insufficient documentation

## 2019-08-01 DIAGNOSIS — Z01812 Encounter for preprocedural laboratory examination: Secondary | ICD-10-CM | POA: Diagnosis present

## 2019-08-01 LAB — SARS CORONAVIRUS 2 (TAT 6-24 HRS): SARS Coronavirus 2: NEGATIVE

## 2019-08-01 NOTE — H&P (Signed)
TOTAL KNEE ADMISSION H&P  Patient is being admitted for left total knee arthroplasty.  Subjective:  Chief Complaint:left knee pain.  HPI: Corey Adams., 74 y.o. male, has a history of pain and functional disability in the left knee due to arthritis and has failed non-surgical conservative treatments for greater than 12 weeks to includeNSAID's and/or analgesics, corticosteriod injections, flexibility and strengthening excercises, supervised PT with diminished ADL's post treatment, use of assistive devices, weight reduction as appropriate and activity modification.  Onset of symptoms was gradual, starting 5 years ago with gradually worsening course since that time. The patient noted no past surgery on the left knee(s).  Patient currently rates pain in the left knee(s) at 10 out of 10 with activity. Patient has night pain, worsening of pain with activity and weight bearing, pain that interferes with activities of daily living, crepitus and joint swelling.  Patient has evidence of subchondral cysts, subchondral sclerosis, periarticular osteophytes and joint space narrowing by imaging studies. There is no active infection.  Patient Active Problem List   Diagnosis Date Noted  . S/P shoulder replacement, left 09/07/2016  . 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 Actinic keratitis   . Alcohol abuse    history no drink since 2015  . Anxiety   . Arthritis    "left thumb" (03/05/2014)  . Childhood asthma   . Chronic lower back pain   . Complication of anesthesia    difficulty urinating after anesthesia  . Depression   . GERD (gastroesophageal reflux disease)   . Hyperlipidemia   . Hypertension   . Kidney stones    "plenty; no ORs" (03/05/2014)  . Migraine    "last one was in the 1990's" (03/05/2014)  . Multiple allergies    "sinus stays swollen all the time"  . Pre-diabetes   .  Sleep apnea    "have mask; don't use it" (03/05/2014)    Past Surgical History:  Procedure Laterality Date  . CARPAL TUNNEL RELEASE Bilateral 1990's  . COLON SURGERY    . DISTAL CLAVICLE EXCISION Right 03/05/2014  . EXCISIONAL HEMORRHOIDECTOMY  1983  . LAPAROSCOPIC LOW ANTERIOR RESECTION N/A 10/30/2012   Procedure: LAPAROSCOPIC LOW ANTERIOR RESECTION with Rigid Proctoscopy;  Surgeon: Merrie Roof, MD;  Location: [facility];  Service: General;  Laterality: N/A;  . NASAL SINUS SURGERY  1990's  . TOTAL SHOULDER ARTHROPLASTY Right 03/05/2014   Procedure: TOTAL SHOULDER ARTHROPLASTY;  Surgeon: Nita Sells, MD;  Location: [facility];  Service: Orthopedics;  Laterality: Right;  Right shoulder replacement, distal clavicle excision  . TOTAL SHOULDER ARTHROPLASTY Left 09/07/2016  . TOTAL SHOULDER ARTHROPLASTY Left 09/07/2016   Procedure: TOTAL SHOULDER ARTHROPLASTY;  Surgeon: Tania Ade, MD;  Location: [facility];  Service: Orthopedics;  Laterality: Left;  Left total shoulder arthroplasty  . VASECTOMY      No current facility-administered medications for this encounter.   Current Outpatient Medications  Medication Sig Dispense Refill Last Dose  . aspirin EC 325 MG tablet Take 325 mg by mouth daily.      Marland Kitchen atorvastatin (LIPITOR) 10 MG tablet Take 10 mg by mouth at bedtime.      . bisoprolol-hydrochlorothiazide (ZIAC) 2.5-6.25 MG per tablet Take 1 tablet by mouth every evening.      . diphenhydrAMINE (BENADRYL) 25 MG tablet Take 25 mg by mouth at bedtime as needed for allergies.     Marland Kitchen  escitalopram (LEXAPRO) 20 MG tablet Take 20 mg by mouth daily.     . mometasone (NASONEX) 50 MCG/ACT nasal spray Place 2 sprays into the nose 2 (two) times daily as needed (congestion).      Marland Kitchen omeprazole (PRILOSEC) 40 MG capsule Take 40 mg by mouth daily.       Facility-Administered Medications Ordered in Other Encounters  Medication Dose Route Frequency Provider Last Rate Last Admin  . chlorhexidine (HIBICLENS) 4  % liquid   Topical Once Tania Ade, MD       Allergies  Allergen Reactions  . Shellfish Allergy Anaphylaxis  . Guar Gum Hives  . Xanthan Gum Hives  . Codeine Nausea And Vomiting    Social History   Tobacco Use  . Smoking status: Never Smoker  . Smokeless tobacco: Never Used  Substance Use Topics  . Alcohol use: Yes    Comment: "recovering alcoholic as of [id number]"    Family History  Problem Relation Age of Onset  . Heart attack Mother   . Heart disease Father   . Heart attack Father   . Hypercholesterolemia Sister      Review of Systems  Musculoskeletal: Positive for arthralgias.       Left knee motion is 5-110.  I do not feel an effusion.  He has no scars.  He has medial greater than lateral joint line pain with crepitation.  Hip motion is full and straight leg raise is negative.  Sensation and motor function are intact in his feet with palpable pulses on both sides.   All other systems reviewed and are negative.   Objective:  Physical Exam  Constitutional: He is oriented to person, place, and time. He appears well-developed and well-nourished.  HENT:  Head: Normocephalic and atraumatic.  Eyes: Pupils are equal, round, and reactive to light.  Cardiovascular: Normal rate and regular rhythm.  Respiratory: Effort normal.  GI: Soft.  Musculoskeletal:     Cervical back: Normal range of motion.     Comments: Left knee motion is 5-110.  I do not feel an effusion.  He has no scars.  He has medial greater than lateral joint line pain with crepitation.  Hip motion is full and straight leg raise is negative.  Sensation and motor function are intact in his feet with palpable pulses on both sides.   Neurological: He is alert and oriented to person, place, and time.  Skin: Skin is warm and dry.  Psychiatric: He has a normal mood and affect. His behavior is normal. Judgment and thought content normal.    Vital signs in last 24 hours:    Labs:   Estimated body mass  index is 29.27 kg/m as calculated from the following:   Height as of 07/24/19: 5\' 8"  (1.727 m).   Weight as of 07/24/19: 87.3 kg.   Imaging Review Plain radiographs demonstrate severe degenerative joint disease of the left knee(s). The overall alignment isneutral. The bone quality appears to be good for age and reported activity level.      Assessment/Plan:  End stage primary arthritis, left knee   The patient history, physical examination, clinical judgment of the provider and imaging studies are consistent with end stage degenerative joint disease of the left knee(s) and total knee arthroplasty is deemed medically necessary. The treatment options including medical management, injection therapy arthroscopy and arthroplasty were discussed at length. The risks and benefits of total knee arthroplasty were presented and reviewed. The risks due to aseptic loosening, infection, stiffness,  patella tracking problems, thromboembolic complications and other imponderables were discussed. The patient acknowledged the explanation, agreed to proceed with the plan and consent was signed. Patient is being admitted for inpatient treatment for surgery, pain control, PT, OT, prophylactic antibiotics, VTE prophylaxis, progressive ambulation and ADL's and discharge planning. The patient is planning to be discharged home with home health services  Patient's anticipated LOS is less than 2 midnights, meeting these requirements: - Younger than 78 - Lives within 1 hour of care - Has a competent adult at home to recover with post-op recover - NO history of  - Chronic pain requiring opiods  - Diabetes  - Coronary Artery Disease  - Heart failure  - Heart attack  - Stroke  - DVT/VTE  - Cardiac arrhythmia  - Respiratory Failure/COPD  - Renal failure  - Anemia  - Advanced Liver disease

## 2019-08-04 MED ORDER — BUPIVACAINE LIPOSOME 1.3 % IJ SUSP
20.0000 mL | INTRAMUSCULAR | Status: DC
  Filled 2019-08-04: qty 20

## 2019-08-04 MED ORDER — TRANEXAMIC ACID 1000 MG/10ML IV SOLN
2000.0000 mg | INTRAVENOUS | Status: DC
  Filled 2019-08-04: qty 20

## 2019-08-05 ENCOUNTER — Ambulatory Visit (HOSPITAL_COMMUNITY): Payer: Medicare PPO | Admitting: Anesthesiology

## 2019-08-05 ENCOUNTER — Encounter (HOSPITAL_COMMUNITY)
Admission: RE | Disposition: A | Discharge status: Home / Self Care | Payer: Self-pay | Source: Ambulatory Visit | Attending: Orthopaedic Surgery

## 2019-08-05 ENCOUNTER — Encounter (HOSPITAL_COMMUNITY): Payer: Self-pay | Admitting: Orthopaedic Surgery

## 2019-08-05 ENCOUNTER — Observation Stay (HOSPITAL_COMMUNITY)
Admission: RE | Admit: 2019-08-05 | Discharge: 2019-08-06 | Disposition: A | Discharge status: Home / Self Care | Payer: Medicare PPO | Source: Ambulatory Visit | Attending: Orthopaedic Surgery | Admitting: Orthopaedic Surgery

## 2019-08-05 DIAGNOSIS — K219 Gastro-esophageal reflux disease without esophagitis: Secondary | ICD-10-CM | POA: Diagnosis not present

## 2019-08-05 DIAGNOSIS — E785 Hyperlipidemia, unspecified: Secondary | ICD-10-CM | POA: Diagnosis not present

## 2019-08-05 DIAGNOSIS — Z79899 Other long term (current) drug therapy: Secondary | ICD-10-CM | POA: Insufficient documentation

## 2019-08-05 DIAGNOSIS — Z9114 Patient's other noncompliance with medication regimen: Secondary | ICD-10-CM | POA: Insufficient documentation

## 2019-08-05 DIAGNOSIS — I10 Essential (primary) hypertension: Secondary | ICD-10-CM | POA: Diagnosis not present

## 2019-08-05 DIAGNOSIS — Z7982 Long term (current) use of aspirin: Secondary | ICD-10-CM | POA: Insufficient documentation

## 2019-08-05 DIAGNOSIS — F419 Anxiety disorder, unspecified: Secondary | ICD-10-CM | POA: Diagnosis not present

## 2019-08-05 DIAGNOSIS — M1712 Unilateral primary osteoarthritis, left knee: Secondary | ICD-10-CM | POA: Diagnosis present

## 2019-08-05 DIAGNOSIS — E119 Type 2 diabetes mellitus without complications: Secondary | ICD-10-CM | POA: Insufficient documentation

## 2019-08-05 DIAGNOSIS — Z96612 Presence of left artificial shoulder joint: Secondary | ICD-10-CM | POA: Insufficient documentation

## 2019-08-05 DIAGNOSIS — F329 Major depressive disorder, single episode, unspecified: Secondary | ICD-10-CM | POA: Diagnosis not present

## 2019-08-05 DIAGNOSIS — G4730 Sleep apnea, unspecified: Secondary | ICD-10-CM | POA: Insufficient documentation

## 2019-08-05 HISTORY — PX: TOTAL KNEE ARTHROPLASTY: SHX125

## 2019-08-05 LAB — TYPE AND SCREEN
ABO/RH(D): O POS
Antibody Screen: NEGATIVE

## 2019-08-05 SURGERY — ARTHROPLASTY, KNEE, TOTAL
Anesthesia: Spinal | Site: Knee | Laterality: Left

## 2019-08-05 MED ORDER — POVIDONE-IODINE 10 % EX SWAB: 2.0000 "application " | Freq: Once | CUTANEOUS | Status: DC

## 2019-08-05 MED ORDER — ALUM & MAG HYDROXIDE-SIMETH 200-200-20 MG/5ML PO SUSP: 30.0000 mL | ORAL | Status: DC | PRN

## 2019-08-05 MED ORDER — DEXAMETHASONE SODIUM PHOSPHATE 4 MG/ML IJ SOLN
INTRAMUSCULAR | Status: DC | PRN
Start: 2019-08-05 — End: 2019-08-05
  Administered 2019-08-05: 4 mg via INTRAVENOUS

## 2019-08-05 MED ORDER — MIDAZOLAM HCL 2 MG/2ML IJ SOLN
1.0000 mg | Freq: Once | INTRAMUSCULAR | Status: DC
  Filled 2019-08-05: qty 2

## 2019-08-05 MED ORDER — ONDANSETRON HCL 4 MG/2ML IJ SOLN
INTRAMUSCULAR | Status: DC | PRN
  Administered 2019-08-05: 4 mg via INTRAVENOUS

## 2019-08-05 MED ORDER — ASPIRIN 81 MG PO CHEW
81.0000 mg | CHEWABLE_TABLET | Freq: Two times a day (BID) | ORAL | Status: DC
  Administered 2019-08-06: 81 mg via ORAL
  Filled 2019-08-05: qty 1

## 2019-08-05 MED ORDER — HYDROCODONE-ACETAMINOPHEN 7.5-325 MG PO TABS: 1.0000 | ORAL_TABLET | ORAL | Status: DC | PRN

## 2019-08-05 MED ORDER — PROPOFOL 500 MG/50ML IV EMUL
INTRAVENOUS | Status: DC | PRN
  Administered 2019-08-05: 25 ug/kg/min via INTRAVENOUS

## 2019-08-05 MED ORDER — TRANEXAMIC ACID-NACL 1000-0.7 MG/100ML-% IV SOLN
1000.0000 mg | Freq: Once | INTRAVENOUS | Status: AC
  Administered 2019-08-05: 1000 mg via INTRAVENOUS
  Filled 2019-08-05: qty 100

## 2019-08-05 MED ORDER — METHOCARBAMOL 500 MG IVPB - SIMPLE MED
500.0000 mg | Freq: Four times a day (QID) | INTRAVENOUS | Status: DC | PRN
  Filled 2019-08-05: qty 50

## 2019-08-05 MED ORDER — GLYCOPYRROLATE 0.2 MG/ML IJ SOLN
INTRAMUSCULAR | Status: DC | PRN
Start: 2019-08-05 — End: 2019-08-05
  Administered 2019-08-05: .2 mg via INTRAVENOUS

## 2019-08-05 MED ORDER — ACETAMINOPHEN 325 MG PO TABS: 325.0000 mg | ORAL_TABLET | Freq: Four times a day (QID) | ORAL | Status: DC | PRN

## 2019-08-05 MED ORDER — ONDANSETRON HCL 4 MG PO TABS: 4.0000 mg | ORAL_TABLET | Freq: Four times a day (QID) | ORAL | Status: DC | PRN

## 2019-08-05 MED ORDER — CEFAZOLIN SODIUM-DEXTROSE 2-4 GM/100ML-% IV SOLN
2.0000 g | INTRAVENOUS | Status: AC
  Administered 2019-08-05: 2 g via INTRAVENOUS
  Filled 2019-08-05: qty 100

## 2019-08-05 MED ORDER — ROPIVACAINE HCL 7.5 MG/ML IJ SOLN
INTRAMUSCULAR | Status: DC | PRN
Start: 2019-08-05 — End: 2019-08-05
  Administered 2019-08-05: 20 mL via PERINEURAL

## 2019-08-05 MED ORDER — BUPIVACAINE-EPINEPHRINE 0.5% -1:200000 IJ SOLN
INTRAMUSCULAR | Status: DC | PRN
  Administered 2019-08-05: 30 mL

## 2019-08-05 MED ORDER — PANTOPRAZOLE SODIUM 40 MG PO TBEC
80.0000 mg | DELAYED_RELEASE_TABLET | Freq: Every day | ORAL | Status: DC
  Administered 2019-08-06: 80 mg via ORAL
  Filled 2019-08-05: qty 2

## 2019-08-05 MED ORDER — DIPHENHYDRAMINE HCL 12.5 MG/5ML PO ELIX: 12.5000 mg | ORAL_SOLUTION | ORAL | Status: DC | PRN

## 2019-08-05 MED ORDER — BISACODYL 5 MG PO TBEC: 5.0000 mg | DELAYED_RELEASE_TABLET | Freq: Every day | ORAL | Status: DC | PRN

## 2019-08-05 MED ORDER — BISOPROLOL-HYDROCHLOROTHIAZIDE 2.5-6.25 MG PO TABS
1.0000 | ORAL_TABLET | Freq: Every evening | ORAL | Status: DC
  Administered 2019-08-05: 1 via ORAL
  Filled 2019-08-05: qty 1

## 2019-08-05 MED ORDER — MENTHOL 3 MG MT LOZG: 1.0000 | LOZENGE | OROMUCOSAL | Status: DC | PRN

## 2019-08-05 MED ORDER — METHOCARBAMOL 500 MG PO TABS
500.0000 mg | ORAL_TABLET | Freq: Four times a day (QID) | ORAL | Status: DC | PRN
  Administered 2019-08-06: 500 mg via ORAL
  Filled 2019-08-05: qty 1

## 2019-08-05 MED ORDER — SODIUM CHLORIDE (PF) 0.9 % IJ SOLN
INTRAMUSCULAR | Status: DC | PRN
  Administered 2019-08-05: 30 mL via INTRAVENOUS

## 2019-08-05 MED ORDER — CEFAZOLIN SODIUM-DEXTROSE 2-4 GM/100ML-% IV SOLN
2.0000 g | Freq: Four times a day (QID) | INTRAVENOUS | Status: AC
  Administered 2019-08-05 (×2): 2 g via INTRAVENOUS
  Filled 2019-08-05 (×2): qty 100

## 2019-08-05 MED ORDER — MORPHINE SULFATE (PF) 2 MG/ML IV SOLN: 0.5000 mg | INTRAVENOUS | Status: DC | PRN

## 2019-08-05 MED ORDER — FENTANYL CITRATE (PF) 100 MCG/2ML IJ SOLN
50.0000 ug | Freq: Once | INTRAMUSCULAR | Status: AC
  Administered 2019-08-05: 50 ug via INTRAVENOUS
  Filled 2019-08-05: qty 2

## 2019-08-05 MED ORDER — HYDROCODONE-ACETAMINOPHEN 5-325 MG PO TABS
1.0000 | ORAL_TABLET | ORAL | Status: DC | PRN
  Administered 2019-08-05 – 2019-08-06 (×4): 2 via ORAL
  Filled 2019-08-05 (×4): qty 2

## 2019-08-05 MED ORDER — 0.9 % SODIUM CHLORIDE (POUR BTL) OPTIME
TOPICAL | Status: DC | PRN
  Administered 2019-08-05: 11:00:00 3000 mL

## 2019-08-05 MED ORDER — SODIUM CHLORIDE (PF) 0.9 % IJ SOLN
INTRAMUSCULAR | Status: AC
  Filled 2019-08-05: qty 50

## 2019-08-05 MED ORDER — BUPIVACAINE IN DEXTROSE 0.75-8.25 % IT SOLN
INTRATHECAL | Status: DC | PRN
  Administered 2019-08-05: 1.6 mL via INTRATHECAL

## 2019-08-05 MED ORDER — OXYCODONE HCL 5 MG/5ML PO SOLN: 5.0000 mg | Freq: Once | ORAL | Status: DC | PRN

## 2019-08-05 MED ORDER — LACTATED RINGERS IV SOLN: INTRAVENOUS | Status: DC

## 2019-08-05 MED ORDER — TRANEXAMIC ACID-NACL 1000-0.7 MG/100ML-% IV SOLN
1000.0000 mg | INTRAVENOUS | Status: AC
  Administered 2019-08-05: 1000 mg via INTRAVENOUS
  Filled 2019-08-05: qty 100

## 2019-08-05 MED ORDER — ATORVASTATIN CALCIUM 10 MG PO TABS
10.0000 mg | ORAL_TABLET | Freq: Every day | ORAL | Status: DC
  Administered 2019-08-05: 10 mg via ORAL
  Filled 2019-08-05: qty 1

## 2019-08-05 MED ORDER — FENTANYL CITRATE (PF) 100 MCG/2ML IJ SOLN: 25.0000 ug | INTRAMUSCULAR | Status: DC | PRN

## 2019-08-05 MED ORDER — ACETAMINOPHEN 500 MG PO TABS
500.0000 mg | ORAL_TABLET | Freq: Four times a day (QID) | ORAL | Status: DC
  Administered 2019-08-05 – 2019-08-06 (×2): 500 mg via ORAL
  Filled 2019-08-05 (×3): qty 1

## 2019-08-05 MED ORDER — PHENOL 1.4 % MT LIQD: 1.0000 | OROMUCOSAL | Status: DC | PRN

## 2019-08-05 MED ORDER — BUPIVACAINE-EPINEPHRINE (PF) 0.5% -1:200000 IJ SOLN
INTRAMUSCULAR | Status: AC
  Filled 2019-08-05: qty 30

## 2019-08-05 MED ORDER — ONDANSETRON HCL 4 MG/2ML IJ SOLN: 4.0000 mg | Freq: Four times a day (QID) | INTRAMUSCULAR | Status: DC | PRN

## 2019-08-05 MED ORDER — PHENYLEPHRINE HCL-NACL 10-0.9 MG/250ML-% IV SOLN
INTRAVENOUS | Status: DC | PRN
  Administered 2019-08-05: 15 ug/min via INTRAVENOUS

## 2019-08-05 MED ORDER — DIPHENHYDRAMINE HCL 25 MG PO TABS
25.0000 mg | ORAL_TABLET | Freq: Every evening | ORAL | Status: DC | PRN
  Filled 2019-08-05: qty 1

## 2019-08-05 MED ORDER — BUPIVACAINE LIPOSOME 1.3 % IJ SUSP
INTRAMUSCULAR | Status: DC | PRN
  Administered 2019-08-05: 20 mL

## 2019-08-05 MED ORDER — MUPIROCIN 2 % EX OINT
1.0000 "application " | TOPICAL_OINTMENT | Freq: Once | CUTANEOUS | Status: AC
  Administered 2019-08-05: 1 via TOPICAL
  Filled 2019-08-05: qty 22

## 2019-08-05 MED ORDER — OXYCODONE HCL 5 MG PO TABS: 5.0000 mg | ORAL_TABLET | Freq: Once | ORAL | Status: DC | PRN

## 2019-08-05 MED ORDER — KETOROLAC TROMETHAMINE 15 MG/ML IJ SOLN
7.5000 mg | Freq: Four times a day (QID) | INTRAMUSCULAR | Status: DC
  Administered 2019-08-05 – 2019-08-06 (×2): 7.5 mg via INTRAVENOUS
  Filled 2019-08-05 (×3): qty 1

## 2019-08-05 MED ORDER — TRANEXAMIC ACID 1000 MG/10ML IV SOLN
INTRAVENOUS | Status: DC | PRN
  Administered 2019-08-05: 12:00:00 2000 mg via TOPICAL

## 2019-08-05 MED ORDER — DOCUSATE SODIUM 100 MG PO CAPS
100.0000 mg | ORAL_CAPSULE | Freq: Two times a day (BID) | ORAL | Status: DC
  Administered 2019-08-05 – 2019-08-06 (×2): 100 mg via ORAL
  Filled 2019-08-05 (×2): qty 1

## 2019-08-05 MED ORDER — ONDANSETRON HCL 4 MG/2ML IJ SOLN: 4.0000 mg | Freq: Once | INTRAMUSCULAR | Status: DC | PRN

## 2019-08-05 MED ORDER — METOCLOPRAMIDE HCL 5 MG PO TABS: 5.0000 mg | ORAL_TABLET | Freq: Three times a day (TID) | ORAL | Status: DC | PRN

## 2019-08-05 MED ORDER — FENTANYL CITRATE (PF) 100 MCG/2ML IJ SOLN
INTRAMUSCULAR | Status: DC | PRN
  Administered 2019-08-05 (×4): 25 ug via INTRAVENOUS

## 2019-08-05 MED ORDER — ESCITALOPRAM OXALATE 20 MG PO TABS
20.0000 mg | ORAL_TABLET | Freq: Every day | ORAL | Status: DC
  Administered 2019-08-06: 20 mg via ORAL
  Filled 2019-08-05: qty 1

## 2019-08-05 MED ORDER — METOCLOPRAMIDE HCL 5 MG/ML IJ SOLN: 5.0000 mg | Freq: Three times a day (TID) | INTRAMUSCULAR | Status: DC | PRN

## 2019-08-05 SURGICAL SUPPLY — 51 items
ATTUNE MED DOME PAT 41 KNEE (Knees) ×2 IMPLANT
ATTUNE MED DOME PAT 41MM KNEE (Knees) ×1 IMPLANT
ATTUNE PS FEM LT SZ 7 CEM KNEE (Femur) ×3 IMPLANT
ATTUNE PSRP INSR SZ7 6 KNEE (Insert) ×2 IMPLANT
ATTUNE PSRP INSR SZ7 6MM KNEE (Insert) ×1 IMPLANT
BAG DECANTER FOR FLEXI CONT (MISCELLANEOUS) ×3 IMPLANT
BAG ZIPLOCK 12X15 (MISCELLANEOUS) ×3 IMPLANT
BASE TIBIAL ROT PLAT SZ 7 KNEE (Knees) ×1 IMPLANT
BLADE SAGITTAL 25.0X1.19X90 (BLADE) ×2 IMPLANT
BLADE SAGITTAL 25.0X1.19X90MM (BLADE) ×1
BLADE SAW SGTL 11.0X1.19X90.0M (BLADE) ×3 IMPLANT
BNDG ELASTIC 6X15 VLCR STRL LF (GAUZE/BANDAGES/DRESSINGS) ×3 IMPLANT
BNDG ELASTIC 6X5.8 VLCR STR LF (GAUZE/BANDAGES/DRESSINGS) ×3 IMPLANT
BOOTIES KNEE HIGH SLOAN (MISCELLANEOUS) ×3 IMPLANT
BOWL SMART MIX CTS (DISPOSABLE) ×3 IMPLANT
CEMENT HV SMART SET (Cement) ×6 IMPLANT
COVER SURGICAL LIGHT HANDLE (MISCELLANEOUS) ×3 IMPLANT
COVER WAND RF STERILE (DRAPES) ×3 IMPLANT
CUFF TOURN SGL QUICK 34 (TOURNIQUET CUFF) ×3
CUFF TRNQT CYL 34X4.125X (TOURNIQUET CUFF) ×1 IMPLANT
DECANTER SPIKE VIAL GLASS SM (MISCELLANEOUS) ×6 IMPLANT
DRAPE SHEET LG 3/4 BI-LAMINATE (DRAPES) ×3 IMPLANT
DRAPE TOP 10253 STERILE (DRAPES) ×3 IMPLANT
DRAPE U-SHAPE 47X51 STRL (DRAPES) ×3 IMPLANT
DRSG AQUACEL AG ADV 3.5X10 (GAUZE/BANDAGES/DRESSINGS) ×3 IMPLANT
DURAPREP 26ML APPLICATOR (WOUND CARE) ×6 IMPLANT
ELECT REM PT RETURN 15FT ADLT (MISCELLANEOUS) ×3 IMPLANT
GLOVE BIO SURGEON STRL SZ8 (GLOVE) ×6 IMPLANT
GLOVE BIOGEL PI IND STRL 8 (GLOVE) ×2 IMPLANT
GLOVE BIOGEL PI INDICATOR 8 (GLOVE) ×4
GOWN STRL REUS W/TWL XL LVL3 (GOWN DISPOSABLE) ×6 IMPLANT
HANDPIECE INTERPULSE COAX TIP (DISPOSABLE) ×3
HOLDER FOLEY CATH W/STRAP (MISCELLANEOUS) IMPLANT
HOOD PEEL AWAY FLYTE STAYCOOL (MISCELLANEOUS) ×9 IMPLANT
KIT TURNOVER KIT A (KITS) IMPLANT
MANIFOLD NEPTUNE II (INSTRUMENTS) ×3 IMPLANT
NS IRRIG 1000ML POUR BTL (IV SOLUTION) ×3 IMPLANT
PACK TOTAL KNEE CUSTOM (KITS) ×3 IMPLANT
PAD ARMBOARD 7.5X6 YLW CONV (MISCELLANEOUS) ×3 IMPLANT
PENCIL SMOKE EVACUATOR (MISCELLANEOUS) IMPLANT
PROTECTOR NERVE ULNAR (MISCELLANEOUS) ×3 IMPLANT
SET HNDPC FAN SPRY TIP SCT (DISPOSABLE) ×1 IMPLANT
SUT ETHIBOND NAB CT1 #1 30IN (SUTURE) ×6 IMPLANT
SUT VIC AB 0 CT1 36 (SUTURE) ×3 IMPLANT
SUT VIC AB 2-0 CT1 27 (SUTURE) ×3
SUT VIC AB 2-0 CT1 TAPERPNT 27 (SUTURE) ×1 IMPLANT
SUT VICRYL AB 3-0 FS1 BRD 27IN (SUTURE) ×3 IMPLANT
TIBIAL BASE ROT PLAT SZ 7 KNEE (Knees) ×3 IMPLANT
TRAY FOLEY MTR SLVR 16FR STAT (SET/KITS/TRAYS/PACK) IMPLANT
WATER STERILE IRR 1000ML POUR (IV SOLUTION) ×3 IMPLANT
WRAP KNEE MAXI GEL POST OP (GAUZE/BANDAGES/DRESSINGS) ×3 IMPLANT

## 2019-08-05 NOTE — Interval H&P Note (Signed)
History and Physical Interval Note:  08/05/2019 9:18 AM  Corey Adams.  has presented today for surgery, with the diagnosis of LEFT KNEE DEGENERATIVE JOINT DISEASE.  The various methods of treatment have been discussed with the patient and family. After consideration of risks, benefits and other options for treatment, the patient has consented to  Procedure(s): LEFT TOTAL KNEE ARTHROPLASTY (Left) as a surgical intervention.  The patient's history has been reviewed, patient examined, no change in status, stable for surgery.  I have reviewed the patient's chart and labs.  Questions were answered to the patient's satisfaction.     Hessie Dibble

## 2019-08-05 NOTE — Plan of Care (Signed)
Plan of care discussed.   

## 2019-08-05 NOTE — Progress Notes (Signed)
AssistedDr. Brock with left, ultrasound guided, adductor canal block. Side rails up, monitors on throughout procedure. See vital signs in flow sheet. Tolerated Procedure well.  

## 2019-08-05 NOTE — Op Note (Signed)
PREOP DIAGNOSIS: DJD LEFT KNEE POSTOP DIAGNOSIS:  same PROCEDURE: LEFT TKR ANESTHESIA: Spinal and MAC ATTENDING SURGEON: Hessie Dibble ASSISTANT: Loni Dolly PA  INDICATIONS FOR PROCEDURE: Corey Adams. is a 74 y.o. male who has struggled for a long time with pain due to degenerative arthritis of the left knee.  The patient has failed many conservative non-operative measures and at this point has pain which limits the ability to sleep and walk.  The patient is offered total knee replacement.  Informed operative consent was obtained after discussion of possible risks of anesthesia, infection, neurovascular injury, DVT, and death.  The importance of the post-operative rehabilitation protocol to optimize result was stressed extensively with the patient.  SUMMARY OF FINDINGS AND PROCEDURE:  [name]. was taken to the operative suite where under the above anesthesia a left knee replacement was performed.  There were advanced degenerative changes and the bone quality was excellent.  We used the DePuyAttune system and placed size 7 femur, 7 tibia, 41 mm all polyethylene patella, and a size 6 mm spacer.  Loni Dolly PA-C assisted throughout and was invaluable to the completion of the case in that he helped retract and maintain exposure while I placed the components.  He also helped close thereby minimizing OR time.  The patient was admitted for appropriate post-op care to include perioperative antibiotics and mechanical and pharmacologic measures for DVT prophylaxis.  DESCRIPTION OF PROCEDURE:  [name]. was taken to the operative suite where the above anesthesia was applied.  The patient was positioned supine and prepped and draped in normal sterile fashion.  An appropriate time out was performed.  After the administration of kefzol pre-op antibiotic the leg was elevated and exsanguinated and a tourniquet inflated.  A standard longitudinal incision was made on the anterior knee.   Dissection was carried down to the extensor mechanism.  All appropriate anti-infective measures were used including the pre-operative antibiotic, betadine impregnated drape, and closed hooded exhaust systems for each member of the surgical team.  A medial parapatellar incision was made in the extensor mechanism and the knee cap flipped and the knee flexed.  Some residual meniscal tissues were removed along with any remaining ACL/PCL tissue.  A guide was placed on the tibia and a flat cut was made on it's superior surface.  An intramedullary guide was placed in the femur and was utilized to make anterior and posterior cuts creating an appropriate flexion gap.  A second intramedullary guide was placed in the femur to make a distal cut properly balancing the knee with an extension gap equal to the flexion gap.  The three bones sized to the above mentioned sizes and the appropriate guides were placed and utilized.  A trial reduction was done and the knee easily came to full extension and the patella tracked well on flexion.  The trial components were removed and all bones were cleaned with pulsatile lavage and then dried thoroughly.  Cement was mixed and was pressurized onto the bones followed by placement of the aforementioned components.  Excess cement was trimmed and pressure was held on the components until the cement had hardened.  The tourniquet was deflated and a small amount of bleeding was controlled with cautery and pressure.  The knee was irrigated thoroughly.  The extensor mechanism was re-approximated with #1 ethibond in interrupted fashion.  The knee was flexed and the repair was solid.  The subcutaneous tissues were re-approximated with #0 and #2-0 vicryl  and the skin closed with a subcuticular stitch and steristrips.  A sterile dressing was applied.  Intraoperative fluids, EBL, and tourniquet time can be obtained from anesthesia records.  DISPOSITION:  The patient was taken to recovery room in stable  condition and admitted for appropriate post-op care to include peri-operative antibiotic and DVT prophylaxis with mechanical and pharmacologic measures.  Hessie Dibble 08/05/2019, 12:13 PM

## 2019-08-05 NOTE — Transfer of Care (Signed)
Immediate Anesthesia Transfer of Care Note  Patient: Corey Adams.  Procedure(s) Performed: LEFT TOTAL KNEE ARTHROPLASTY (Left Knee)  Patient Location: PACU  Anesthesia Type:Spinal  Level of Consciousness: awake, alert , oriented and patient cooperative  Airway & Oxygen Therapy: Patient Spontanous Breathing and Patient connected to face mask oxygen  Post-op Assessment: Report given to RN and Post -op Vital signs reviewed and stable  Post vital signs: Reviewed and stable  Last Vitals:  Vitals Value Taken Time  BP 103/62 08/05/19 1245  Temp    Pulse 76 08/05/19 1245  Resp 16 08/05/19 1245  SpO2 100 % 08/05/19 1245  Vitals shown include unvalidated device data.  Last Pain:  Vitals:   08/05/19 0855  TempSrc:   PainSc: 0-No pain         Complications: No apparent anesthesia complications

## 2019-08-05 NOTE — Evaluation (Signed)
Physical Therapy Evaluation Patient Details Name: Corey Adams. MRN: [id number] DOB: 1946/03/11 Today's Date: 08/05/2019   History of Present Illness  Patient is 74 y.o. male s/p Lt TKA on 08/05/19 with PMH significant for HTN, HLD, GERD, depression, anxiety, OA, migraines, bil TSA, bil CTR.    Clinical Impression  [name]. is a 74 y.o. male POD 0 s/p Lt TKA. Patient reports independence with mobility at baseline. Patient is now limited by functional impairments (see PT problem list below) and requires min assist for transfers and gait with RW. Patient was able to ambulate 80 feet with RW and min guard/assist. Patient instructed in exercise to facilitate ROM and circulation. Patient will benefit from continued skilled PT interventions to address impairments and progress towards PLOF. Acute PT will follow to progress mobility and stair training in preparation for safe discharge home.     Follow Up Recommendations Follow surgeon's recommendation for DC plan and follow-up therapies;Outpatient PT    Equipment Recommendations  Rolling walker with 5" wheels    Recommendations for Other Services       Precautions / Restrictions Precautions Precautions: Fall Restrictions Weight Bearing Restrictions: No      Mobility  Bed Mobility Overal bed mobility: Needs Assistance Bed Mobility: Supine to Sit     Supine to sit: Min guard;HOB elevated     General bed mobility comments: cues for use of bed rail, pt needs some extra time, able to complete with guard assist.  Transfers Overall transfer level: Needs assistance Equipment used: Rolling walker (2 wheeled) Transfers: Sit to/from Stand Sit to Stand: Min assist         General transfer comment: cues for technique/safe hand placement, light min assist to steady but pt able to initiate power up without assist.   Ambulation/Gait Ambulation/Gait assistance: Min assist;Min guard Gait Distance (Feet): 80 Feet Assistive  device: Rolling walker (2 wheeled) Gait Pattern/deviations: Step-to pattern;Decreased stance time - left;Decreased weight shift to left Gait velocity: decreased   General Gait Details: cues for safe step sequencing and safe proximity to RW. pt required min assist at start and progressed to min guard wtih safe management of walker.  Stairs            Wheelchair Mobility    Modified Rankin (Stroke Patients Only)       Balance Overall balance assessment: Mild deficits observed, not formally tested               Pertinent Vitals/Pain Pain Assessment: 0-10 Pain Score: 3  Pain Location: Lt knee Pain Descriptors / Indicators: Aching;Sore Pain Intervention(s): Limited activity within patient's tolerance;Monitored during session;Repositioned;Ice applied    Home Living Family/patient expects to be discharged to:: Private residence Living Arrangements: Spouse/significant other Available Help at Discharge: Family;Available 24 hours/day Type of Home: House Home Access: Stairs to enter Entrance Stairs-Rails: Psychiatric nurse of Steps: 3-4 Home Layout: One level Home Equipment: Cane - single point;Shower seat;Walker - 4 wheels;Bedside commode Additional Comments: pt's mother-in-law is staying with therm and his wife is helping her.    Prior Function Level of Independence: Independent         Comments: pt enjoys photography as a hobby     Hand Dominance   Dominant Hand: Right    Extremity/Trunk Assessment   Upper Extremity Assessment Upper Extremity Assessment: Overall WFL for tasks assessed    Lower Extremity Assessment Lower Extremity Assessment: LLE deficits/detail LLE Deficits / Details: good quad activation, no extensor lag with  SLR LLE Sensation: WNL LLE Coordination: WNL    Cervical / Trunk Assessment Cervical / Trunk Assessment: Normal  Communication   Communication: No difficulties  Cognition Arousal/Alertness:  Awake/alert Behavior During Therapy: WFL for tasks assessed/performed Overall Cognitive Status: Within Functional Limits for tasks assessed             General Comments      Exercises Total Joint Exercises Ankle Circles/Pumps: AROM;20 reps;Both;Seated Quad Sets: AROM;Left;10 reps;Seated Heel Slides: AROM;Left;10 reps;Seated   Assessment/Plan    PT Assessment Patient needs continued PT services  PT Problem List Decreased strength;Decreased range of motion;Decreased activity tolerance;Decreased balance;Decreased mobility;Decreased knowledge of use of DME       PT Treatment Interventions Gait training;DME instruction;Stair training;Functional mobility training;Therapeutic activities;Therapeutic exercise;Balance training;Patient/family education    PT Goals (Current goals can be found in the Care Plan section)  Acute Rehab PT Goals Patient Stated Goal: to be able to walk outside to take landscape photographs.  PT Goal Formulation: With patient Time For Goal Achievement: 08/12/19 Potential to Achieve Goals: Good    Frequency 7X/week    AM-PAC PT "6 Clicks" Mobility  Outcome Measure Help needed turning from your back to your side while in a flat bed without using bedrails?: None Help needed moving from lying on your back to sitting on the side of a flat bed without using bedrails?: None Help needed moving to and from a bed to a chair (including a wheelchair)?: A Little Help needed standing up from a chair using your arms (e.g., wheelchair or bedside chair)?: A Little Help needed to walk in hospital room?: A Little Help needed climbing 3-5 steps with a railing? : A Little 6 Click Score: 20    End of Session Equipment Utilized During Treatment: Gait belt Activity Tolerance: Patient tolerated treatment well Patient left: in chair;with call bell/phone within reach;with chair alarm set;with family/visitor present Nurse Communication: Mobility status PT Visit Diagnosis: Muscle  weakness (generalized) (M62.81);Difficulty in walking, not elsewhere classified (R26.2)    Time: [id number] PT Time Calculation (min) (ACUTE ONLY): 34 min   Charges:   PT Evaluation $PT Eval Low Complexity: 1 Low PT Treatments $Gait Training: 8-22 mins      Verner Mould, DPT Physical Therapist with [facility] [phone]  08/05/2019 6:42 PM

## 2019-08-05 NOTE — Anesthesia Postprocedure Evaluation (Signed)
Anesthesia Post Note  Patient: Corey Adams.  Procedure(s) Performed: LEFT TOTAL KNEE ARTHROPLASTY (Left Knee)     Patient location during evaluation: PACU Anesthesia Type: Spinal Level of consciousness: awake and alert Pain management: pain level controlled Vital Signs Assessment: post-procedure vital signs reviewed and stable Respiratory status: spontaneous breathing and respiratory function stable Cardiovascular status: blood pressure returned to baseline and stable Postop Assessment: spinal receding and no apparent nausea or vomiting Anesthetic complications: no    Last Vitals:  Vitals:   08/05/19 1300 08/05/19 1330  BP: (!) 105/56 95/74  Pulse: 75 69  Resp: 12 10  Temp:    SpO2: 100% 99%    Last Pain:  Vitals:   08/05/19 1330  TempSrc:   PainSc: [place]

## 2019-08-05 NOTE — Anesthesia Procedure Notes (Signed)
Spinal  Patient location during procedure: OR Start time: 08/05/2019 10:21 AM End time: 08/05/2019 10:30 AM Staffing Performed: anesthesiologist  Anesthesiologist: Audry Pili, MD Preanesthetic Checklist Completed: patient identified, IV checked, risks and benefits discussed, surgical consent, monitors and equipment checked, pre-op evaluation and timeout performed Spinal Block Patient position: sitting Prep: DuraPrep Patient monitoring: heart rate, cardiac monitor, continuous pulse ox and blood pressure Approach: midline Location: L3-4 Injection technique: single-shot Needle Needle type: Quincke  Needle gauge: 22 G Additional Notes Consent was obtained prior to the procedure with all questions answered and concerns addressed. Risks including, but not limited to, bleeding, infection, nerve damage, paralysis, failed block, inadequate analgesia, allergic reaction, high spinal, itching, and headache were discussed and the patient wished to proceed. Functioning IV was confirmed and monitors were applied. Sterile prep and drape, including hand hygiene, mask, and sterile gloves were used. The patient was positioned and the spine was prepped. The skin was anesthetized with lidocaine. First 2 attempts by CRNA unsuccessful with 25ga Pencan. Thecal space located by Dr. Fransisco Beau with 22ga Quincke. Free flow of clear CSF was obtained prior to injecting local anesthetic into the CSF. The spinal needle aspirated freely following injection. The needle was carefully withdrawn. The patient tolerated the procedure well.   Renold Don, MD

## 2019-08-05 NOTE — Anesthesia Procedure Notes (Signed)
Anesthesia Regional Block: Adductor canal block   Pre-Anesthetic Checklist: ,, timeout performed, Correct Patient, Correct Site, Correct Laterality, Correct Procedure, Correct Position, site marked, Risks and benefits discussed,  Surgical consent,  Pre-op evaluation,  At surgeon's request and post-op pain management  Laterality: Left  Prep: chloraprep       Needles:  Injection technique: Single-shot  Needle Type: Echogenic Needle     Needle Length: 10cm  Needle Gauge: 21     Additional Needles:   Narrative:  Start time: 08/05/2019 8:51 AM End time: 08/05/2019 8:55 AM Injection made incrementally with aspirations every 5 mL.  Performed by: Personally  Anesthesiologist: Audry Pili, MD  Additional Notes: No pain on injection. No increased resistance to injection. Injection made in 5cc increments. Good needle visualization. Patient tolerated the procedure well.

## 2019-08-05 NOTE — Anesthesia Preprocedure Evaluation (Addendum)
Anesthesia Evaluation  Patient identified by MRN, date of birth, ID band Patient awake    Reviewed: Allergy & Precautions, NPO status , Patient's Chart, lab work & pertinent test results  History of Anesthesia Complications Negative for: history of anesthetic complications  Airway Mallampati: II  TM Distance: >3 FB Neck ROM: Full    Dental  (+) Dental Advisory Given, Teeth Intact   Pulmonary asthma , sleep apnea (noncompliant) ,    Pulmonary exam normal        Cardiovascular hypertension, Pt. on medications Normal cardiovascular exam     Neuro/Psych  Headaches, PSYCHIATRIC DISORDERS Anxiety Depression    GI/Hepatic GERD  Medicated and Controlled,(+)     substance abuse  alcohol use and marijuana use,   Endo/Other  diabetes (pre-diabetic)  Renal/GU negative Renal ROS     Musculoskeletal  (+) Arthritis ,   Abdominal   Peds  Hematology negative hematology ROS (+)  Plt 221k    Anesthesia Other Findings Covid neg 4/30  Reproductive/Obstetrics                            Anesthesia Physical Anesthesia Plan  ASA: III  Anesthesia Plan: Spinal   Post-op Pain Management:  Regional for Post-op pain   Induction:   PONV Risk Score and Plan: 1 and Treatment may vary due to age or medical condition and Propofol infusion  Airway Management Planned: Natural Airway and Simple Face Mask  Additional Equipment: None  Intra-op Plan:   Post-operative Plan:   Informed Consent: I have reviewed the patients History and Physical, chart, labs and discussed the procedure including the risks, benefits and alternatives for the proposed anesthesia with the patient or authorized representative who has indicated his/her understanding and acceptance.       Plan Discussed with: CRNA and Anesthesiologist  Anesthesia Plan Comments: (Labs reviewed, platelets acceptable. Discussed risks and benefits of  spinal, including spinal/epidural hematoma, infection, failed block, and PDPH. Patient expressed understanding and wished to proceed. )       Anesthesia Quick Evaluation

## 2019-08-06 ENCOUNTER — Other Ambulatory Visit: Payer: Self-pay

## 2019-08-06 DIAGNOSIS — M1712 Unilateral primary osteoarthritis, left knee: Secondary | ICD-10-CM | POA: Diagnosis not present

## 2019-08-06 MED ORDER — TIZANIDINE HCL 4 MG PO TABS: 4.0000 mg | ORAL_TABLET | Freq: Four times a day (QID) | ORAL | 1 refills | Status: AC | PRN

## 2019-08-06 MED ORDER — ASPIRIN 81 MG PO CHEW: 81.0000 mg | CHEWABLE_TABLET | Freq: Two times a day (BID) | ORAL | 0 refills | Status: DC

## 2019-08-06 MED ORDER — HYDROCODONE-ACETAMINOPHEN 5-325 MG PO TABS: 1.0000 | ORAL_TABLET | Freq: Four times a day (QID) | ORAL | 0 refills | Status: DC | PRN

## 2019-08-06 NOTE — Progress Notes (Signed)
Subjective: 1 Day Post-Op Procedure(s) (LRB): LEFT TOTAL KNEE ARTHROPLASTY (Left)   Patient doing well and wanting to go home today.  Activity level:  wbat Diet tolerance:  ok Voiding:  ok Patient reports pain as mild.    Objective: Vital signs in last 24 hours: Temp:  [97.6 F (36.4 C)-98.5 F (36.9 C)] 97.9 F (36.6 C) (05/05 0654) Pulse Rate:  [57-82] 60 (05/05 0654) Resp:  [10-16] 14 (05/05 0654) BP: (93-121)/(56-88) 112/57 (05/05 0654) SpO2:  [95 %-100 %] 96 % (05/05 0654)  Labs: No results for input(s): HGB in the last 72 hours. No results for input(s): WBC, RBC, HCT, PLT in the last 72 hours. No results for input(s): NA, K, CL, CO2, BUN, CREATININE, GLUCOSE, CALCIUM in the last 72 hours. No results for input(s): LABPT, INR in the last 72 hours.  Physical Exam:  Neurologically intact ABD soft Neurovascular intact Sensation intact distally Intact pulses distally Dorsiflexion/Plantar flexion intact Incision: dressing C/D/I and no drainage No cellulitis present Compartment soft  Assessment/Plan:  1 Day Post-Op Procedure(s) (LRB): LEFT TOTAL KNEE ARTHROPLASTY (Left) Advance diet Up with therapy D/C IV fluids Discharge home with home health today after PT. Continue on asas 81mg  BID x 2 weeks post op. Follow up in office 2 weeks post op.    Corey Adams 08/06/2019, 8:08 AM

## 2019-08-06 NOTE — Progress Notes (Signed)
Patient alert and oriented, skin warm and dry.  Discharged to wife without difficulty, concerns or complaints.

## 2019-08-06 NOTE — TOC Transition Note (Signed)
Transition of Care [facility]) - CM/SW Discharge Note   Patient Details  Name: Corey Adams. MRN: [id number] Date of Birth: 1945-04-05  Transition of Care [facility]) CM/SW Contact:  Lennart Pall, LCSW Phone Number: 08/06/2019, 10:16 AM   Clinical Narrative:  Met briefly with pt to review d/c arrangements.  Pt aware [facility] to provide HHPT.  Reports he does have a 3n1, however, only a rollator at home ("inherited" from family member) - have placed referral for RW via [place].  No further needs.       Barriers to Discharge: No Barriers Identified   Patient Goals and CMS Choice        Discharge Placement                       Discharge Plan and Services                DME Arranged: Walker rolling DME Agency: [employer] Date DME Agency Contacted: 08/06/19 Time DME Agency Contacted: [id number] Representative spoke with at DME Agency: [place]: PT [place]: [facility])(pre-arranged via Ortho office)        Social Determinants of Health ([place]) Interventions     Readmission Risk Interventions No flowsheet data found.

## 2019-08-06 NOTE — Discharge Summary (Signed)
Patient ID: Corey Adams. MRN: [id number] DOB/AGE: [birth year] 74 y.o.  Admit date: 08/05/2019 Discharge date: 08/06/2019  Admission Diagnoses:  Principal Problem:   Primary osteoarthritis of left knee   Discharge Diagnoses:  Same  Past Medical History:  Diagnosis Date  . Actinic keratitis   . Alcohol abuse    history no drink since 2015  . Anxiety   . Arthritis    "left thumb" (03/05/2014)  . Childhood asthma   . Chronic lower back pain   . Complication of anesthesia    difficulty urinating after anesthesia  . Depression   . GERD (gastroesophageal reflux disease)   . Hyperlipidemia   . Hypertension   . Kidney stones    "plenty; no ORs" (03/05/2014)  . Migraine    "last one was in the 1990's" (03/05/2014)  . Multiple allergies    "sinus stays swollen all the time"  . Pre-diabetes   . Sleep apnea    "have mask; don't use it" (03/05/2014)    Surgeries: Procedure(s): LEFT TOTAL KNEE ARTHROPLASTY on 08/05/2019   Consultants:   Discharged Condition: Improved  Hospital Course: Nevo Klausen. is an 74 y.o. male who was admitted 08/05/2019 for operative treatment ofPrimary osteoarthritis of left knee. Patient has severe unremitting pain that affects sleep, daily activities, and work/hobbies. After pre-op clearance the patient was taken to the operating room on 08/05/2019 and underwent  Procedure(s): LEFT TOTAL KNEE ARTHROPLASTY.    Patient was given perioperative antibiotics:  Anti-infectives (From admission, onward)   Start     Dose/Rate Route Frequency Ordered Stop   08/05/19 1630  ceFAZolin (ANCEF) IVPB 2g/100 mL premix     2 g 200 mL/hr over 30 Minutes Intravenous Every 6 hours 08/05/19 1557 08/05/19 2250   08/05/19 0730  ceFAZolin (ANCEF) IVPB 2g/100 mL premix     2 g 200 mL/hr over 30 Minutes Intravenous On call to O.R. 08/05/19 0719 08/05/19 1028       Patient was given sequential compression devices, early ambulation, and chemoprophylaxis to prevent  DVT.  Patient benefited maximally from hospital stay and there were no complications.    Recent vital signs:  Patient Vitals for the past 24 hrs:  BP Temp Temp src Pulse Resp SpO2  08/06/19 0654 (!) 112/57 97.9 F (36.6 C) Oral 60 14 96 %  08/06/19 0154 (!) 93/58 98 F (36.7 C) Oral (!) 58 14 95 %  08/05/19 2222 (!) 94/59 98.2 F (36.8 C) Oral (!) 57 16 95 %  08/05/19 1552 120/68 97.9 F (36.6 C) -- 72 -- 97 %  08/05/19 1542 119/64 -- -- 82 -- 96 %  08/05/19 1530 113/73 -- -- 69 14 100 %  08/05/19 1515 116/75 -- -- 76 13 99 %  08/05/19 1500 111/88 -- -- 78 15 100 %  08/05/19 1445 104/67 97.6 F (36.4 C) -- 71 12 98 %  08/05/19 1430 105/65 -- -- 72 12 98 %  08/05/19 1415 99/60 -- -- 75 12 98 %  08/05/19 1400 99/60 -- -- 75 12 99 %  08/05/19 1345 103/68 -- -- 70 10 98 %  08/05/19 1330 95/74 -- -- 69 10 99 %  08/05/19 1315 102/61 -- -- 71 14 100 %  08/05/19 1300 (!) 105/56 -- -- 75 12 100 %  08/05/19 1245 103/62 98.5 F (36.9 C) -- 76 16 100 %  08/05/19 0940 107/68 -- -- 74 15 98 %  08/05/19 0925 117/70 -- -- 73 11  98 %  08/05/19 0910 112/75 -- -- 71 10 98 %  08/05/19 0855 120/81 -- -- 70 10 99 %  08/05/19 0850 121/76 -- -- 66 13 98 %     Recent laboratory studies: No results for input(s): WBC, HGB, HCT, PLT, NA, K, CL, CO2, BUN, CREATININE, GLUCOSE, INR, CALCIUM in the last 72 hours.  Invalid input(s): PT, 2   Discharge Medications:   Allergies as of 08/06/2019      Reactions   Shellfish Allergy Anaphylaxis   Guar Gum Hives   Xanthan Gum Hives   Codeine Nausea And Vomiting      Medication List    STOP taking these medications   aspirin EC 325 MG tablet Replaced by: aspirin 81 MG chewable tablet     TAKE these medications   aspirin 81 MG chewable tablet Chew 1 tablet (81 mg total) by mouth 2 (two) times daily. Replaces: aspirin EC 325 MG tablet   atorvastatin 10 MG tablet Commonly known as: LIPITOR Take 10 mg by mouth at bedtime.    bisoprolol-hydrochlorothiazide 2.5-6.25 MG tablet Commonly known as: ZIAC Take 1 tablet by mouth every evening.   diphenhydrAMINE 25 MG tablet Commonly known as: BENADRYL Take 25 mg by mouth at bedtime as needed for allergies.   escitalopram 20 MG tablet Commonly known as: LEXAPRO Take 20 mg by mouth daily.   HYDROcodone-acetaminophen 5-325 MG tablet Commonly known as: NORCO/VICODIN Take 1-2 tablets by mouth every 6 (six) hours as needed for moderate pain (post op pain).   mometasone 50 MCG/ACT nasal spray Commonly known as: NASONEX Place 2 sprays into the nose 2 (two) times daily as needed (congestion).   omeprazole 40 MG capsule Commonly known as: PRILOSEC Take 40 mg by mouth daily.   tiZANidine 4 MG tablet Commonly known as: Zanaflex Take 1 tablet (4 mg total) by mouth every 6 (six) hours as needed for muscle spasms.            Durable Medical Equipment  (From admission, onward)         Start     Ordered   08/05/19 1558  DME Walker rolling  Once    Question:  Patient needs a walker to treat with the following condition  Answer:  Primary osteoarthritis of left knee   08/05/19 1557   08/05/19 1558  DME 3 n 1  Once     08/05/19 1557   08/05/19 1558  DME Bedside commode  Once    Question:  Patient needs a bedside commode to treat with the following condition  Answer:  Primary osteoarthritis of left knee   08/05/19 1557          Diagnostic Studies: DG Chest 2 View  Result Date: 07/25/2019 CLINICAL DATA:  Preoperative respiratory evaluation prior to LEFT total knee arthroplasty. EXAM: CHEST - 2 VIEW COMPARISON:  09/04/2016 and earlier. FINDINGS: Cardiac silhouette normal in size, unchanged. Thoracic aorta mildly tortuous, unchanged. Small to moderate-sized hiatal hernia, unchanged. Hilar and mediastinal contours otherwise unremarkable. Chronic scarring in the LEFT LOWER LOBE. Lungs otherwise clear. Bronchovascular markings normal. Pulmonary vascularity normal. No  visible pleural effusions. No pneumothorax. Prior BILATERAL shoulder arthroplasties. Degenerative changes involving the thoracic and upper lumbar spine. IMPRESSION: No acute cardiopulmonary disease. Chronic scarring in the LEFT LOWER LOBE. Stable small to moderate-sized hiatal hernia. Electronically Signed   By: Evangeline Dakin M.D.   On: 07/25/2019 08:57    Disposition: Discharge disposition: 01-Home or Self Care  Discharge Instructions    Call MD / Call 911   Complete by: As directed    If you experience chest pain or shortness of breath, CALL 911 and be transported to the hospital emergency room.  If you develope a fever above 101 F, pus (white drainage) or increased drainage or redness at the wound, or calf pain, call your surgeon's office.   Constipation Prevention   Complete by: As directed    Drink plenty of fluids.  Prune juice may be helpful.  You may use a stool softener, such as Colace (over the counter) 100 mg twice a day.  Use MiraLax (over the counter) for constipation as needed.   Diet - low sodium heart healthy   Complete by: As directed    Discharge instructions   Complete by: As directed    INSTRUCTIONS AFTER JOINT REPLACEMENT   Remove items at home which could result in a fall. This includes throw rugs or furniture in walking pathways ICE to the affected joint every three hours while awake for 30 minutes at a time, for at least the first 3-5 days, and then as needed for pain and swelling.  Continue to use ice for pain and swelling. You may notice swelling that will progress down to the foot and ankle.  This is normal after surgery.  Elevate your leg when you are not up walking on it.   Continue to use the breathing machine you got in the hospital (incentive spirometer) which will help keep your temperature down.  It is common for your temperature to cycle up and down following surgery, especially at night when you are not up moving around and exerting yourself.  The  breathing machine keeps your lungs expanded and your temperature down.   DIET:  As you were doing prior to hospitalization, we recommend a well-balanced diet.  DRESSING / WOUND CARE / SHOWERING  You may shower 3 days after surgery, but keep the wounds dry during showering.  You may use an occlusive plastic wrap (Press'n Seal for example), NO SOAKING/SUBMERGING IN THE BATHTUB.  If the bandage gets wet, change with a clean dry gauze.  If the incision gets wet, pat the wound dry with a clean towel.  ACTIVITY  Increase activity slowly as tolerated, but follow the weight bearing instructions below.   No driving for 6 weeks or until further direction given by your physician.  You cannot drive while taking narcotics.  No lifting or carrying greater than 10 lbs. until further directed by your surgeon. Avoid periods of inactivity such as sitting longer than an hour when not asleep. This helps prevent blood clots.  You may return to work once you are authorized by your doctor.     WEIGHT BEARING   Weight bearing as tolerated with assist device (walker, cane, etc) as directed, use it as long as suggested by your surgeon or therapist, typically at least 4-6 weeks.   EXERCISES  Results after joint replacement surgery are often greatly improved when you follow the exercise, range of motion and muscle strengthening exercises prescribed by your doctor. Safety measures are also important to protect the joint from further injury. Any time any of these exercises cause you to have increased pain or swelling, decrease what you are doing until you are comfortable again and then slowly increase them. If you have problems or questions, call your caregiver or physical therapist for advice.   Rehabilitation is important following a joint replacement. After just a few  days of immobilization, the muscles of the leg can become weakened and shrink (atrophy).  These exercises are designed to build up the tone and  strength of the thigh and leg muscles and to improve motion. Often times heat used for twenty to thirty minutes before working out will loosen up your tissues and help with improving the range of motion but do not use heat for the first two weeks following surgery (sometimes heat can increase post-operative swelling).   These exercises can be done on a training (exercise) mat, on the floor, on a table or on a bed. Use whatever works the best and is most comfortable for you.    Use music or television while you are exercising so that the exercises are a pleasant break in your day. This will make your life better with the exercises acting as a break in your routine that you can look forward to.   Perform all exercises about fifteen times, three times per day or as directed.  You should exercise both the operative leg and the other leg as well.   Exercises include:   Quad Sets - Tighten up the muscle on the front of the thigh (Quad) and hold for 5-10 seconds.   Straight Leg Raises - With your knee straight (if you were given a brace, keep it on), lift the leg to 60 degrees, hold for 3 seconds, and slowly lower the leg.  Perform this exercise against resistance later as your leg gets stronger.  Leg Slides: Lying on your back, slowly slide your foot toward your buttocks, bending your knee up off the floor (only go as far as is comfortable). Then slowly slide your foot back down until your leg is flat on the floor again.  Angel Wings: Lying on your back spread your legs to the side as far apart as you can without causing discomfort.  Hamstring Strength:  Lying on your back, push your heel against the floor with your leg straight by tightening up the muscles of your buttocks.  Repeat, but this time bend your knee to a comfortable angle, and push your heel against the floor.  You may put a pillow under the heel to make it more comfortable if necessary.   A rehabilitation program following joint replacement  surgery can speed recovery and prevent re-injury in the future due to weakened muscles. Contact your doctor or a physical therapist for more information on knee rehabilitation.    CONSTIPATION  Constipation is defined medically as fewer than three stools per week and severe constipation as less than one stool per week.  Even if you have a regular bowel pattern at home, your normal regimen is likely to be disrupted due to multiple reasons following surgery.  Combination of anesthesia, postoperative narcotics, change in appetite and fluid intake all can affect your bowels.   YOU MUST use at least one of the following options; they are listed in order of increasing strength to get the job done.  They are all available over the counter, and you may need to use some, POSSIBLY even all of these options:    Drink plenty of fluids (prune juice may be helpful) and high fiber foods Colace 100 mg by mouth twice a day  Senokot for constipation as directed and as needed Dulcolax (bisacodyl), take with full glass of water  Miralax (polyethylene glycol) once or twice a day as needed.  If you have tried all these things and are unable to have a  bowel movement in the first 3-4 days after surgery call either your surgeon or your primary doctor.    If you experience loose stools or diarrhea, hold the medications until you stool forms back up.  If your symptoms do not get better within 1 week or if they get worse, check with your doctor.  If you experience "the worst abdominal pain ever" or develop nausea or vomiting, please contact the office immediately for further recommendations for treatment.   ITCHING:  If you experience itching with your medications, try taking only a single pain pill, or even half a pain pill at a time.  You can also use Benadryl over the counter for itching or also to help with sleep.   TED HOSE STOCKINGS:  Use stockings on both legs until for at least 2 weeks or as directed by physician  office. They may be removed at night for sleeping.  MEDICATIONS:  See your medication summary on the "After Visit Summary" that nursing will review with you.  You may have some home medications which will be placed on hold until you complete the course of blood thinner medication.  It is important for you to complete the blood thinner medication as prescribed.  PRECAUTIONS:  If you experience chest pain or shortness of breath - call 911 immediately for transfer to the hospital emergency department.   If you develop a fever greater that 101 F, purulent drainage from wound, increased redness or drainage from wound, foul odor from the wound/dressing, or calf pain - CONTACT YOUR SURGEON.                                                   FOLLOW-UP APPOINTMENTS:  If you do not already have a post-op appointment, please call the office for an appointment to be seen by your surgeon.  Guidelines for how soon to be seen are listed in your "After Visit Summary", but are typically between 1-4 weeks after surgery.  OTHER INSTRUCTIONS:   Knee Replacement:  Do not place pillow under knee, focus on keeping the knee straight while resting. CPM instructions: 0-90 degrees, 2 hours in the morning, 2 hours in the afternoon, and 2 hours in the evening. Place foam block, curve side up under heel at all times except when in CPM or when walking.  DO NOT modify, tear, cut, or change the foam block in any way.   DENTAL ANTIBIOTICS:  In most cases prophylactic antibiotics for Dental procdeures after total joint surgery are not necessary.  Exceptions are as follows:  1. History of prior total joint infection  2. Severely immunocompromised (Organ Transplant, cancer chemotherapy, Rheumatoid biologic meds such as [place])  3. Poorly controlled diabetes (A1C &gt; 8.0, blood glucose over 200)  If you have one of these conditions, contact your surgeon for an antibiotic prescription, prior to your dental  procedure.   MAKE SURE YOU:  Understand these instructions.  Get help right away if you are not doing well or get worse.    Thank you for letting us be a part of your medical care team.  It is a privilege we respect greatly.  We hope these instructions will help you stay on track for a fast and full recovery!   Increase activity slowly as tolerated   Complete by: As directed  Follow-up Information    Melrose Nakayama, MD. Schedule an appointment as soon as possible for a visit in 2 weeks.   Specialty: Orthopedic Surgery Contact information: [place] [address] [phone]            Signed: Larwance Sachs Kaliyah Gladman 08/06/2019, 8:12 AM

## 2019-08-06 NOTE — Progress Notes (Signed)
Physical Therapy Treatment Patient Details Name: Corey Adams. MRN: [id number] DOB: March 15, 1946 Today's Date: 08/06/2019    History of Present Illness Patient is 74 y.o. male s/p Lt TKA on 08/05/19 with PMH significant for HTN, HLD, GERD, depression, anxiety, OA, migraines, bil TSA, bil CTR.    PT Comments    Pt ambulated in hallway and practiced safe stair technique.  Pt also performed LE exercises and provided with HEP handout.  Pt had no further questions and feels ready for d/c home today.    Follow Up Recommendations  Follow surgeon's recommendation for DC plan and follow-up therapies;Outpatient PT     Equipment Recommendations  Rolling walker with 5" wheels    Recommendations for Other Services       Precautions / Restrictions Precautions Precautions: Fall;Knee Restrictions Weight Bearing Restrictions: No    Mobility  Bed Mobility Overal bed mobility: Modified Independent                Transfers Overall transfer level: Needs assistance Equipment used: Rolling walker (2 wheeled) Transfers: Sit to/from Stand Sit to Stand: Min guard         General transfer comment: verbal cues for UE and LE positioning  Ambulation/Gait Ambulation/Gait assistance: Min guard Gait Distance (Feet): 100 Feet Assistive device: Rolling walker (2 wheeled) Gait Pattern/deviations: Step-to pattern;Decreased stance time - left;Antalgic Gait velocity: decreased   General Gait Details: verbal cues for sequence, RW positioning, step length   Stairs Stairs: Yes Stairs assistance: Min guard Stair Management: Step to pattern;Forwards;One rail Right Number of Stairs: 2 General stair comments: verbal cues for sequence and safety, pt performed with ease with only use of right rail   Wheelchair Mobility    Modified Rankin (Stroke Patients Only)       Balance                                            Cognition Arousal/Alertness:  Awake/alert Behavior During Therapy: WFL for tasks assessed/performed Overall Cognitive Status: Within Functional Limits for tasks assessed                                        Exercises Total Joint Exercises Ankle Circles/Pumps: AROM;Both;10 reps Quad Sets: AROM;Left;10 reps Heel Slides: AAROM;Left;10 reps Hip ABduction/ADduction: AROM;Left;Standing;10 reps Straight Leg Raises: AROM;Left;10 reps Long Arc Quad: AROM;Left;Seated;10 reps Knee Flexion: AROM;Standing;10 reps;Left Marching in Standing: AROM;Left;10 reps;Standing    General Comments        Pertinent Vitals/Pain Pain Assessment: 0-10 Pain Score: 2  Pain Location: Lt knee Pain Descriptors / Indicators: Aching;Sore Pain Intervention(s): Repositioned;Monitored during session    Home Living                      Prior Function            PT Goals (current goals can now be found in the care plan section) Progress towards PT goals: Progressing toward goals    Frequency    7X/week      PT Plan Current plan remains appropriate    Co-evaluation              AM-PAC PT "6 Clicks" Mobility   Outcome Measure  Help needed turning from your back to your side while in  a flat bed without using bedrails?: None Help needed moving from lying on your back to sitting on the side of a flat bed without using bedrails?: None Help needed moving to and from a bed to a chair (including a wheelchair)?: A Little Help needed standing up from a chair using your arms (e.g., wheelchair or bedside chair)?: A Little Help needed to walk in hospital room?: A Little Help needed climbing 3-5 steps with a railing? : A Little 6 Click Score: 20    End of Session Equipment Utilized During Treatment: Gait belt Activity Tolerance: Patient tolerated treatment well Patient left: in chair;with call bell/phone within reach Nurse Communication: Mobility status PT Visit Diagnosis: Muscle weakness (generalized)  (M62.81);Difficulty in walking, not elsewhere classified (R26.2)     Time: [id number] PT Time Calculation (min) (ACUTE ONLY): 26 min  Charges:  $Gait Training: 8-22 mins $Therapeutic Exercise: 8-22 mins                     Arlyce Dice, DPT Acute Rehabilitation Services Office: [phone]   [name],[name] 08/06/2019, 11:44 AM

## 2019-08-06 NOTE — Plan of Care (Signed)
Plan of care reviewed and discussed with the patient. 

## 2019-08-08 ENCOUNTER — Encounter: Payer: Self-pay | Admitting: *Deleted

## 2019-09-03 DIAGNOSIS — M25562 Pain in left knee: Secondary | ICD-10-CM | POA: Diagnosis not present

## 2019-09-03 DIAGNOSIS — M25662 Stiffness of left knee, not elsewhere classified: Secondary | ICD-10-CM | POA: Diagnosis not present

## 2019-09-03 DIAGNOSIS — M6281 Muscle weakness (generalized): Secondary | ICD-10-CM | POA: Diagnosis not present

## 2019-09-05 DIAGNOSIS — M6281 Muscle weakness (generalized): Secondary | ICD-10-CM | POA: Diagnosis not present

## 2019-09-05 DIAGNOSIS — M25662 Stiffness of left knee, not elsewhere classified: Secondary | ICD-10-CM | POA: Diagnosis not present

## 2019-09-05 DIAGNOSIS — M25562 Pain in left knee: Secondary | ICD-10-CM | POA: Diagnosis not present

## 2019-09-09 DIAGNOSIS — M6281 Muscle weakness (generalized): Secondary | ICD-10-CM | POA: Diagnosis not present

## 2019-09-09 DIAGNOSIS — M25562 Pain in left knee: Secondary | ICD-10-CM | POA: Diagnosis not present

## 2019-09-09 DIAGNOSIS — M25662 Stiffness of left knee, not elsewhere classified: Secondary | ICD-10-CM | POA: Diagnosis not present

## 2019-09-11 DIAGNOSIS — M25562 Pain in left knee: Secondary | ICD-10-CM | POA: Diagnosis not present

## 2019-09-11 DIAGNOSIS — M6281 Muscle weakness (generalized): Secondary | ICD-10-CM | POA: Diagnosis not present

## 2019-09-11 DIAGNOSIS — M25662 Stiffness of left knee, not elsewhere classified: Secondary | ICD-10-CM | POA: Diagnosis not present

## 2019-09-16 DIAGNOSIS — M25662 Stiffness of left knee, not elsewhere classified: Secondary | ICD-10-CM | POA: Diagnosis not present

## 2019-09-16 DIAGNOSIS — M6281 Muscle weakness (generalized): Secondary | ICD-10-CM | POA: Diagnosis not present

## 2019-09-16 DIAGNOSIS — M25562 Pain in left knee: Secondary | ICD-10-CM | POA: Diagnosis not present

## 2019-09-18 DIAGNOSIS — M25662 Stiffness of left knee, not elsewhere classified: Secondary | ICD-10-CM | POA: Diagnosis not present

## 2019-09-18 DIAGNOSIS — M6281 Muscle weakness (generalized): Secondary | ICD-10-CM | POA: Diagnosis not present

## 2019-09-18 DIAGNOSIS — M25562 Pain in left knee: Secondary | ICD-10-CM | POA: Diagnosis not present

## 2019-09-23 DIAGNOSIS — M25662 Stiffness of left knee, not elsewhere classified: Secondary | ICD-10-CM | POA: Diagnosis not present

## 2019-09-23 DIAGNOSIS — M6281 Muscle weakness (generalized): Secondary | ICD-10-CM | POA: Diagnosis not present

## 2019-09-23 DIAGNOSIS — M25562 Pain in left knee: Secondary | ICD-10-CM | POA: Diagnosis not present

## 2019-09-25 DIAGNOSIS — M25662 Stiffness of left knee, not elsewhere classified: Secondary | ICD-10-CM | POA: Diagnosis not present

## 2019-09-25 DIAGNOSIS — M6281 Muscle weakness (generalized): Secondary | ICD-10-CM | POA: Diagnosis not present

## 2019-09-25 DIAGNOSIS — M25562 Pain in left knee: Secondary | ICD-10-CM | POA: Diagnosis not present

## 2019-10-15 DIAGNOSIS — X32XXXD Exposure to sunlight, subsequent encounter: Secondary | ICD-10-CM | POA: Diagnosis not present

## 2019-10-15 DIAGNOSIS — L821 Other seborrheic keratosis: Secondary | ICD-10-CM | POA: Diagnosis not present

## 2019-10-15 DIAGNOSIS — L570 Actinic keratosis: Secondary | ICD-10-CM | POA: Diagnosis not present

## 2019-10-15 DIAGNOSIS — C44319 Basal cell carcinoma of skin of other parts of face: Secondary | ICD-10-CM | POA: Diagnosis not present

## 2019-10-31 DIAGNOSIS — Z471 Aftercare following joint replacement surgery: Secondary | ICD-10-CM | POA: Diagnosis not present

## 2019-10-31 DIAGNOSIS — Z96652 Presence of left artificial knee joint: Secondary | ICD-10-CM | POA: Diagnosis not present

## 2019-10-31 DIAGNOSIS — M25662 Stiffness of left knee, not elsewhere classified: Secondary | ICD-10-CM | POA: Diagnosis not present

## 2019-11-26 DIAGNOSIS — Z08 Encounter for follow-up examination after completed treatment for malignant neoplasm: Secondary | ICD-10-CM | POA: Diagnosis not present

## 2019-11-26 DIAGNOSIS — Z85828 Personal history of other malignant neoplasm of skin: Secondary | ICD-10-CM | POA: Diagnosis not present

## 2020-01-14 DIAGNOSIS — F330 Major depressive disorder, recurrent, mild: Secondary | ICD-10-CM | POA: Diagnosis not present

## 2020-01-14 DIAGNOSIS — N1831 Chronic kidney disease, stage 3a: Secondary | ICD-10-CM | POA: Diagnosis not present

## 2020-01-14 DIAGNOSIS — Z0000 Encounter for general adult medical examination without abnormal findings: Secondary | ICD-10-CM | POA: Diagnosis not present

## 2020-01-14 DIAGNOSIS — R7309 Other abnormal glucose: Secondary | ICD-10-CM | POA: Diagnosis not present

## 2020-01-14 DIAGNOSIS — I129 Hypertensive chronic kidney disease with stage 1 through stage 4 chronic kidney disease, or unspecified chronic kidney disease: Secondary | ICD-10-CM | POA: Diagnosis not present

## 2020-01-14 DIAGNOSIS — E785 Hyperlipidemia, unspecified: Secondary | ICD-10-CM | POA: Diagnosis not present

## 2020-01-14 DIAGNOSIS — Z23 Encounter for immunization: Secondary | ICD-10-CM | POA: Diagnosis not present

## 2020-01-30 DIAGNOSIS — Z96652 Presence of left artificial knee joint: Secondary | ICD-10-CM | POA: Diagnosis not present

## 2020-01-30 DIAGNOSIS — Z471 Aftercare following joint replacement surgery: Secondary | ICD-10-CM | POA: Diagnosis not present

## 2020-02-10 DIAGNOSIS — F330 Major depressive disorder, recurrent, mild: Secondary | ICD-10-CM | POA: Diagnosis not present

## 2020-03-05 DIAGNOSIS — M25552 Pain in left hip: Secondary | ICD-10-CM | POA: Diagnosis not present

## 2020-03-05 DIAGNOSIS — M1611 Unilateral primary osteoarthritis, right hip: Secondary | ICD-10-CM | POA: Diagnosis not present

## 2020-03-05 DIAGNOSIS — M1612 Unilateral primary osteoarthritis, left hip: Secondary | ICD-10-CM | POA: Diagnosis not present

## 2020-03-16 DIAGNOSIS — Z08 Encounter for follow-up examination after completed treatment for malignant neoplasm: Secondary | ICD-10-CM | POA: Diagnosis not present

## 2020-03-16 DIAGNOSIS — Z85828 Personal history of other malignant neoplasm of skin: Secondary | ICD-10-CM | POA: Diagnosis not present

## 2020-03-16 DIAGNOSIS — X32XXXD Exposure to sunlight, subsequent encounter: Secondary | ICD-10-CM | POA: Diagnosis not present

## 2020-03-16 DIAGNOSIS — L570 Actinic keratosis: Secondary | ICD-10-CM | POA: Diagnosis not present

## 2020-06-10 DIAGNOSIS — K5900 Constipation, unspecified: Secondary | ICD-10-CM | POA: Diagnosis not present

## 2020-06-10 DIAGNOSIS — Z86010 Personal history of colonic polyps: Secondary | ICD-10-CM | POA: Diagnosis not present

## 2020-06-15 DIAGNOSIS — R251 Tremor, unspecified: Secondary | ICD-10-CM | POA: Diagnosis not present

## 2020-06-15 DIAGNOSIS — R413 Other amnesia: Secondary | ICD-10-CM | POA: Diagnosis not present

## 2020-06-25 DIAGNOSIS — H903 Sensorineural hearing loss, bilateral: Secondary | ICD-10-CM | POA: Diagnosis not present

## 2020-08-09 DIAGNOSIS — K5730 Diverticulosis of large intestine without perforation or abscess without bleeding: Secondary | ICD-10-CM | POA: Diagnosis not present

## 2020-08-09 DIAGNOSIS — Z86010 Personal history of colonic polyps: Secondary | ICD-10-CM | POA: Diagnosis not present

## 2020-08-11 DIAGNOSIS — I129 Hypertensive chronic kidney disease with stage 1 through stage 4 chronic kidney disease, or unspecified chronic kidney disease: Secondary | ICD-10-CM | POA: Diagnosis not present

## 2020-08-11 DIAGNOSIS — E785 Hyperlipidemia, unspecified: Secondary | ICD-10-CM | POA: Diagnosis not present

## 2020-08-11 DIAGNOSIS — N1831 Chronic kidney disease, stage 3a: Secondary | ICD-10-CM | POA: Diagnosis not present

## 2020-08-11 DIAGNOSIS — F330 Major depressive disorder, recurrent, mild: Secondary | ICD-10-CM | POA: Diagnosis not present

## 2020-08-24 DIAGNOSIS — H25813 Combined forms of age-related cataract, bilateral: Secondary | ICD-10-CM | POA: Diagnosis not present

## 2020-08-25 ENCOUNTER — Encounter: Payer: Self-pay | Admitting: *Deleted

## 2020-08-26 ENCOUNTER — Other Ambulatory Visit: Payer: Self-pay

## 2020-08-26 ENCOUNTER — Encounter: Payer: Self-pay | Admitting: Neurology

## 2020-08-26 ENCOUNTER — Ambulatory Visit: Payer: Medicare PPO | Admitting: Neurology

## 2020-08-26 VITALS — BP 115/75 | HR 63 | Ht 68.0 in | Wt 198.5 lb

## 2020-08-26 DIAGNOSIS — G3184 Mild cognitive impairment, so stated: Secondary | ICD-10-CM | POA: Insufficient documentation

## 2020-08-26 DIAGNOSIS — R2689 Other abnormalities of gait and mobility: Secondary | ICD-10-CM | POA: Diagnosis not present

## 2020-08-26 MED ORDER — MEMANTINE HCL 10 MG PO TABS
10.0000 mg | ORAL_TABLET | Freq: Two times a day (BID) | ORAL | 11 refills | Status: DC
Start: 2020-08-26 — End: 2020-12-02
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
## 2020-09-01 DIAGNOSIS — Z471 Aftercare following joint replacement surgery: Secondary | ICD-10-CM | POA: Diagnosis not present

## 2020-09-01 DIAGNOSIS — Z96652 Presence of left artificial knee joint: Secondary | ICD-10-CM | POA: Diagnosis not present

## 2020-09-06 ENCOUNTER — Ambulatory Visit
Admission: RE | Admit: 2020-09-06 | Discharge: 2020-09-06 | Disposition: A | Discharge status: Home / Self Care | Payer: Medicare PPO | Source: Ambulatory Visit | Attending: Neurology | Admitting: Neurology

## 2020-09-06 ENCOUNTER — Other Ambulatory Visit: Payer: Self-pay

## 2020-09-06 DIAGNOSIS — G3184 Mild cognitive impairment, so stated: Secondary | ICD-10-CM

## 2020-09-06 DIAGNOSIS — R2689 Other abnormalities of gait and mobility: Secondary | ICD-10-CM | POA: Diagnosis not present

## 2020-09-07 ENCOUNTER — Telehealth: Payer: Self-pay | Admitting: Neurology

## 2020-09-07 NOTE — Telephone Encounter (Signed)
I called the patient.  MRI of the brain was relatively unremarkable, mild atrophy seen.  No significant white matter disease.   MRI brain 09/06/20:  IMPRESSION:   This MRI of the brain without contrast shows the following:.     1.   Mild generalized cortical atrophy, fairly typical for age. 2.   Couple scattered punctate T2/FLAIR hyperintense foci in the hemispheres consistent with very minimal chronic microvascular ischemic, normal for age 75.   No acute findings

## 2020-12-02 ENCOUNTER — Encounter: Payer: Self-pay | Admitting: Neurology

## 2020-12-02 ENCOUNTER — Ambulatory Visit: Payer: Medicare PPO | Admitting: Neurology

## 2020-12-02 VITALS — BP 132/76 | HR 61 | Ht 68.0 in | Wt 204.0 lb

## 2020-12-02 DIAGNOSIS — G3184 Mild cognitive impairment, so stated: Secondary | ICD-10-CM | POA: Diagnosis not present

## 2020-12-02 DIAGNOSIS — Z113 Encounter for screening for infections with a predominantly sexual mode of transmission: Secondary | ICD-10-CM | POA: Diagnosis not present

## 2020-12-02 MED ORDER — MEMANTINE HCL 10 MG PO TABS: 10.0000 mg | ORAL_TABLET | Freq: Two times a day (BID) | ORAL | 4 refills | Status: DC

## 2020-12-02 MED ORDER — DONEPEZIL HCL 10 MG PO TABS
10.0000 mg | ORAL_TABLET | Freq: Every day | ORAL | 11 refills | Status: DC
Start: 2020-12-02 — End: 2022-05-15
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
## 2020-12-03 LAB — RPR: RPR Ser Ql: NONREACTIVE

## 2020-12-03 LAB — TSH: TSH: 1.14 u[IU]/mL (ref 0.450–4.500)

## 2020-12-03 LAB — VITAMIN B12: Vitamin B-12: 317 pg/mL (ref 232–1245)

## 2021-01-19 DIAGNOSIS — T162XXA Foreign body in left ear, initial encounter: Secondary | ICD-10-CM | POA: Diagnosis not present

## 2021-02-01 DIAGNOSIS — E785 Hyperlipidemia, unspecified: Secondary | ICD-10-CM | POA: Diagnosis not present

## 2021-02-01 DIAGNOSIS — R7303 Prediabetes: Secondary | ICD-10-CM | POA: Diagnosis not present

## 2021-02-01 DIAGNOSIS — I129 Hypertensive chronic kidney disease with stage 1 through stage 4 chronic kidney disease, or unspecified chronic kidney disease: Secondary | ICD-10-CM | POA: Diagnosis not present

## 2021-02-02 DIAGNOSIS — R7303 Prediabetes: Secondary | ICD-10-CM | POA: Diagnosis not present

## 2021-02-02 DIAGNOSIS — Z0000 Encounter for general adult medical examination without abnormal findings: Secondary | ICD-10-CM | POA: Diagnosis not present

## 2021-02-02 DIAGNOSIS — N1831 Chronic kidney disease, stage 3a: Secondary | ICD-10-CM | POA: Diagnosis not present

## 2021-02-02 DIAGNOSIS — E785 Hyperlipidemia, unspecified: Secondary | ICD-10-CM | POA: Diagnosis not present

## 2021-02-02 DIAGNOSIS — Z23 Encounter for immunization: Secondary | ICD-10-CM | POA: Diagnosis not present

## 2021-02-02 DIAGNOSIS — F330 Major depressive disorder, recurrent, mild: Secondary | ICD-10-CM | POA: Diagnosis not present

## 2021-02-02 DIAGNOSIS — K219 Gastro-esophageal reflux disease without esophagitis: Secondary | ICD-10-CM | POA: Diagnosis not present

## 2021-02-02 DIAGNOSIS — I129 Hypertensive chronic kidney disease with stage 1 through stage 4 chronic kidney disease, or unspecified chronic kidney disease: Secondary | ICD-10-CM | POA: Diagnosis not present

## 2021-02-02 DIAGNOSIS — M1611 Unilateral primary osteoarthritis, right hip: Secondary | ICD-10-CM | POA: Diagnosis not present

## 2021-08-05 DIAGNOSIS — I129 Hypertensive chronic kidney disease with stage 1 through stage 4 chronic kidney disease, or unspecified chronic kidney disease: Secondary | ICD-10-CM | POA: Diagnosis not present

## 2021-08-05 DIAGNOSIS — E785 Hyperlipidemia, unspecified: Secondary | ICD-10-CM | POA: Diagnosis not present

## 2021-08-05 DIAGNOSIS — N1831 Chronic kidney disease, stage 3a: Secondary | ICD-10-CM | POA: Diagnosis not present

## 2021-08-05 DIAGNOSIS — F330 Major depressive disorder, recurrent, mild: Secondary | ICD-10-CM | POA: Diagnosis not present

## 2021-08-05 DIAGNOSIS — K219 Gastro-esophageal reflux disease without esophagitis: Secondary | ICD-10-CM | POA: Diagnosis not present

## 2021-08-05 DIAGNOSIS — M1611 Unilateral primary osteoarthritis, right hip: Secondary | ICD-10-CM | POA: Diagnosis not present

## 2021-08-05 DIAGNOSIS — Z79899 Other long term (current) drug therapy: Secondary | ICD-10-CM | POA: Diagnosis not present

## 2021-09-14 DIAGNOSIS — M1611 Unilateral primary osteoarthritis, right hip: Secondary | ICD-10-CM | POA: Diagnosis not present

## 2021-10-03 DIAGNOSIS — M1611 Unilateral primary osteoarthritis, right hip: Secondary | ICD-10-CM | POA: Diagnosis not present

## 2021-10-03 DIAGNOSIS — R262 Difficulty in walking, not elsewhere classified: Secondary | ICD-10-CM | POA: Diagnosis not present

## 2021-10-03 DIAGNOSIS — M25651 Stiffness of right hip, not elsewhere classified: Secondary | ICD-10-CM | POA: Diagnosis not present

## 2021-10-17 DIAGNOSIS — M1611 Unilateral primary osteoarthritis, right hip: Secondary | ICD-10-CM | POA: Diagnosis not present

## 2021-10-17 DIAGNOSIS — R2689 Other abnormalities of gait and mobility: Secondary | ICD-10-CM | POA: Diagnosis not present

## 2021-10-17 DIAGNOSIS — M25551 Pain in right hip: Secondary | ICD-10-CM | POA: Diagnosis not present

## 2021-10-21 DIAGNOSIS — R7303 Prediabetes: Secondary | ICD-10-CM | POA: Diagnosis not present

## 2021-10-21 DIAGNOSIS — I129 Hypertensive chronic kidney disease with stage 1 through stage 4 chronic kidney disease, or unspecified chronic kidney disease: Secondary | ICD-10-CM | POA: Diagnosis not present

## 2021-10-21 DIAGNOSIS — Z01818 Encounter for other preprocedural examination: Secondary | ICD-10-CM | POA: Diagnosis not present

## 2021-10-21 DIAGNOSIS — N1831 Chronic kidney disease, stage 3a: Secondary | ICD-10-CM | POA: Diagnosis not present

## 2021-10-21 DIAGNOSIS — M1611 Unilateral primary osteoarthritis, right hip: Secondary | ICD-10-CM | POA: Diagnosis not present

## 2021-10-21 DIAGNOSIS — E785 Hyperlipidemia, unspecified: Secondary | ICD-10-CM | POA: Diagnosis not present

## 2021-10-28 DIAGNOSIS — M1611 Unilateral primary osteoarthritis, right hip: Secondary | ICD-10-CM | POA: Diagnosis not present

## 2021-10-28 DIAGNOSIS — Z96641 Presence of right artificial hip joint: Secondary | ICD-10-CM | POA: Diagnosis not present

## 2021-11-01 DIAGNOSIS — M25551 Pain in right hip: Secondary | ICD-10-CM | POA: Diagnosis not present

## 2021-11-01 DIAGNOSIS — M6281 Muscle weakness (generalized): Secondary | ICD-10-CM | POA: Diagnosis not present

## 2021-11-04 DIAGNOSIS — M6281 Muscle weakness (generalized): Secondary | ICD-10-CM | POA: Diagnosis not present

## 2021-11-04 DIAGNOSIS — M25551 Pain in right hip: Secondary | ICD-10-CM | POA: Diagnosis not present

## 2021-11-07 DIAGNOSIS — Z471 Aftercare following joint replacement surgery: Secondary | ICD-10-CM | POA: Diagnosis not present

## 2021-11-07 DIAGNOSIS — Z96641 Presence of right artificial hip joint: Secondary | ICD-10-CM | POA: Diagnosis not present

## 2021-11-08 DIAGNOSIS — M6281 Muscle weakness (generalized): Secondary | ICD-10-CM | POA: Diagnosis not present

## 2021-11-08 DIAGNOSIS — M25551 Pain in right hip: Secondary | ICD-10-CM | POA: Diagnosis not present

## 2021-11-11 DIAGNOSIS — M6281 Muscle weakness (generalized): Secondary | ICD-10-CM | POA: Diagnosis not present

## 2021-11-11 DIAGNOSIS — M25551 Pain in right hip: Secondary | ICD-10-CM | POA: Diagnosis not present

## 2021-11-14 DIAGNOSIS — M25551 Pain in right hip: Secondary | ICD-10-CM | POA: Diagnosis not present

## 2021-11-14 DIAGNOSIS — M6281 Muscle weakness (generalized): Secondary | ICD-10-CM | POA: Diagnosis not present

## 2021-11-17 DIAGNOSIS — M6281 Muscle weakness (generalized): Secondary | ICD-10-CM | POA: Diagnosis not present

## 2021-11-17 DIAGNOSIS — M25551 Pain in right hip: Secondary | ICD-10-CM | POA: Diagnosis not present

## 2021-11-21 DIAGNOSIS — M25551 Pain in right hip: Secondary | ICD-10-CM | POA: Diagnosis not present

## 2021-11-21 DIAGNOSIS — M6281 Muscle weakness (generalized): Secondary | ICD-10-CM | POA: Diagnosis not present

## 2022-01-23 DIAGNOSIS — Z96641 Presence of right artificial hip joint: Secondary | ICD-10-CM | POA: Diagnosis not present

## 2022-01-23 DIAGNOSIS — Z471 Aftercare following joint replacement surgery: Secondary | ICD-10-CM | POA: Diagnosis not present

## 2022-03-10 DIAGNOSIS — E785 Hyperlipidemia, unspecified: Secondary | ICD-10-CM | POA: Diagnosis not present

## 2022-03-10 DIAGNOSIS — Z0000 Encounter for general adult medical examination without abnormal findings: Secondary | ICD-10-CM | POA: Diagnosis not present

## 2022-03-10 DIAGNOSIS — N1831 Chronic kidney disease, stage 3a: Secondary | ICD-10-CM | POA: Diagnosis not present

## 2022-03-10 DIAGNOSIS — I129 Hypertensive chronic kidney disease with stage 1 through stage 4 chronic kidney disease, or unspecified chronic kidney disease: Secondary | ICD-10-CM | POA: Diagnosis not present

## 2022-03-10 DIAGNOSIS — M159 Polyosteoarthritis, unspecified: Secondary | ICD-10-CM | POA: Diagnosis not present

## 2022-03-10 DIAGNOSIS — R7303 Prediabetes: Secondary | ICD-10-CM | POA: Diagnosis not present

## 2022-03-10 DIAGNOSIS — K219 Gastro-esophageal reflux disease without esophagitis: Secondary | ICD-10-CM | POA: Diagnosis not present

## 2022-03-10 DIAGNOSIS — R251 Tremor, unspecified: Secondary | ICD-10-CM | POA: Diagnosis not present

## 2022-03-10 DIAGNOSIS — F330 Major depressive disorder, recurrent, mild: Secondary | ICD-10-CM | POA: Diagnosis not present

## 2022-05-15 ENCOUNTER — Ambulatory Visit: Payer: Medicare PPO | Admitting: Neurology

## 2022-05-15 ENCOUNTER — Encounter: Payer: Self-pay | Admitting: Neurology

## 2022-05-15 VITALS — BP 120/72 | HR 78 | Ht 68.0 in | Wt 194.5 lb

## 2022-05-15 DIAGNOSIS — G250 Essential tremor: Secondary | ICD-10-CM

## 2022-05-15 DIAGNOSIS — R2689 Other abnormalities of gait and mobility: Secondary | ICD-10-CM

## 2022-05-15 NOTE — Progress Notes (Signed)
Chief Complaint  Patient presents with   New Patient (Initial Visit)    Rm 15. Alone. Corey Adams 2022/Paper proficient referral for Mild hand tremors Donald Prose MD [facility].      ASSESSMENT AND PLAN  Saw Perelli. is a 77 y.o. male   Essential tremor  Check TSH  No parkinsonian features Balance issues  Related to his aging, deconditioning, history of heavy alcohol use,  Encouraged him moderate exercise  On return to clinic for new issues DIAGNOSTIC DATA (LABS, IMAGING, TESTING) - I reviewed patient records, labs, notes, testing and imaging myself where available.   MEDICAL HISTORY:  Corey Adams., is a 77 year old male, seen in request by his primary care physician Dr. Nancy Fetter, [facility] Crown, for evaluation of tremor,   I reviewed and summarized the referring note. 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 normal B12, RPR, TSH,  He noticed intermittent bilateral hands tremor since 2022, gradually getting worse, no limitation in his daily activity, more of a nuisance, noticeable when holding utensils,    PHYSICAL EXAM:   Vitals:   05/15/22 1102  BP: 120/72  Pulse: 78   Weight: 194 lb 8 oz (88.2 kg)  Height: 5' 8"$  (1.727 m)   Not recorded     Body mass index is 29.57 kg/m.  PHYSICAL EXAMNIATION:  Gen: NAD, conversant, well nourised, well groomed                     Cardiovascular: Regular rate rhythm, no peripheral edema, warm, nontender. Eyes: Conjunctivae clear without exudates or hemorrhage Neck: Supple, no carotid bruits. Pulmonary: Clear to auscultation bilaterally   NEUROLOGICAL EXAM:  MENTAL STATUS: Speech/cognition: Awake, alert, oriented to history taking and casual conversation CRANIAL NERVES: CN II: Visual fields are full to confrontation. Pupils are round equal and briskly reactive to light. CN III, IV, VI: extraocular movement are normal. No ptosis. CN V: Facial sensation is intact to light touch CN VII: Face is symmetric with normal eye closure  CN VIII: Hearing is normal to causal conversation. CN IX, X: Phonation is normal. CN XI: Head turning and shoulder shrug are intact  MOTOR: Mild bilateral hand posturing tremor, no weakness, no rigidity, bradykinesia  REFLEXES: Reflexes are 2+ and symmetric at the biceps, triceps, knees, and ankles. Plantar responses are flexor.  SENSORY: Intact to light touch, pinprick and vibratory sensation are intact in fingers and toes.  COORDINATION: There is no trunk or limb dysmetria noted.  GAIT/STANCE: Able to get up from seated position arm crossed, mild difficult to perform tandem walking,  REVIEW OF SYSTEMS:  Full 14 system review of systems performed and notable only for as above  All other review of systems were negative.   ALLERGIES: Allergies  Allergen Reactions   Shellfish Allergy Anaphylaxis   Guar Gum Hives   Xanthan Gum Hives   Codeine Nausea And Vomiting    HOME MEDICATIONS: Current Outpatient Medications  Medication Sig Dispense Refill   aspirin 81 MG chewable tablet Chew 1 tablet (81 mg total) by mouth 2 (two) times daily. 30 tablet 0   atorvastatin  (LIPITOR) 10 MG tablet Take 10 mg by mouth at bedtime.      bisoprolol-hydrochlorothiazide (ZIAC) 2.5-6.25 MG per tablet Take 1 tablet by mouth every evening.      buPROPion (WELLBUTRIN XL) 150 MG 24 hr tablet Take 150 mg by mouth daily.     escitalopram (LEXAPRO) 20 MG tablet Take 20 mg by mouth daily.     meloxicam (MOBIC) 15 MG tablet Take 15 mg by mouth daily.     omeprazole (PRILOSEC) 40 MG capsule Take 40 mg by mouth daily.      donepezil (ARICEPT) 10 MG tablet Take 1 tablet (10 mg total) by mouth at bedtime. (Patient not taking: Reported on 05/15/2022) 30 tablet 11   memantine (NAMENDA) 10 MG tablet Take 1 tablet (10 mg total) by mouth 2 (two) times daily. (Patient not taking: Reported on 05/15/2022) 180 tablet 4   mometasone (NASONEX) 50 MCG/ACT nasal spray Place 2 sprays into the nose 2 (two) times daily as needed (congestion). (Patient not taking: Reported on 05/15/2022)     No current facility-administered medications for this visit.   Facility-Administered Medications Ordered in Other Visits  Medication Dose Route Frequency Provider Last Rate Last Admin   chlorhexidine (HIBICLENS) 4 % liquid   Topical Once Tania Ade, MD        PAST MEDICAL HISTORY: Past Medical History:  Diagnosis Date   Actinic keratitis    Alcohol abuse    history no drink since 2015   Anxiety    Arthritis    "left thumb" (03/05/2014)   Childhood asthma    Chronic lower back pain    CKD (chronic kidney disease), stage III (HCC)    Complication of anesthesia    difficulty urinating after anesthesia   Depression    Esophageal reflux    GERD (gastroesophageal reflux disease)    Hyperlipidemia    Hypertension    Kidney stones    "plenty; no ORs" (03/05/2014)   Memory change    Migraine    "last one was in the 1990's" (03/05/2014)   Multiple allergies    "sinus stays swollen all the time"   Pre-diabetes    Sleep apnea    "have mask; don't use it" (03/05/2014)    PAST SURGICAL HISTORY: Past  Surgical History:  Procedure Laterality Date   CARPAL TUNNEL RELEASE Bilateral 1990's   COLON SURGERY     DISTAL CLAVICLE EXCISION Right 03/05/2014   EXCISIONAL HEMORRHOIDECTOMY  1983   LAPAROSCOPIC LOW ANTERIOR RESECTION N/A 10/30/2012   Procedure: LAPAROSCOPIC LOW ANTERIOR RESECTION with Rigid Proctoscopy;  Surgeon: Merrie Roof, MD;  Location: [facility];  Service: General;  Laterality: N/A;   NASAL SINUS SURGERY  1990's   TOTAL KNEE ARTHROPLASTY Left 08/05/2019   Procedure: LEFT TOTAL KNEE ARTHROPLASTY;  Surgeon: Melrose Nakayama, MD;  Location: [facility];  Service: Orthopedics;  Laterality: Left;   TOTAL SHOULDER ARTHROPLASTY Right 03/05/2014   Procedure: TOTAL SHOULDER ARTHROPLASTY;  Surgeon: Nita Sells, MD;  Location: [facility];  Service: Orthopedics;  Laterality: Right;  Right shoulder  replacement, distal clavicle excision   TOTAL SHOULDER ARTHROPLASTY Left 09/07/2016   TOTAL SHOULDER ARTHROPLASTY Left 09/07/2016   Procedure: TOTAL SHOULDER ARTHROPLASTY;  Surgeon: Tania Ade, MD;  Location: [facility];  Service: Orthopedics;  Laterality: Left;  Left total shoulder arthroplasty   VASECTOMY      FAMILY HISTORY: Family History  Problem Relation Age of Onset   Heart attack Mother    Heart disease Father    Heart attack Father    Hypercholesterolemia Sister     SOCIAL HISTORY: Social History   Socioeconomic History   Marital status: Married    Spouse name: Beth   Number of children: 1   Years of education: 14   Highest education level: Not on file  Occupational History    Comment: retired Software engineer  Tobacco Use   Smoking status: Never   Smokeless tobacco: Never  Vaping Use   Vaping Use: Never used  Substance and Sexual Activity   Alcohol use: Yes    Comment: "recovering alcoholic as of [id number]"   Drug use: Yes    Types: Marijuana    Comment: "only in the 1960's"   Sexual activity: Yes  Other Topics Concern   Not on file  Social History Narrative   Lives  with wife   Caffeine- 2 sodas a day   Social Determinants of Health   Financial Resource Strain: Not on file  Food Insecurity: Not on file  Transportation Needs: Not on file  Physical Activity: Not on file  Stress: Not on file  Social Connections: Not on file  Intimate Partner Violence: Not on file      Marcial Pacas, M.D. Ph.D.  [facility] [address] [place], [place] [address] Ph: [phone] Fax: [phone]  CC:  Donald Prose, [place],  [place] [id number]  Donald Prose, MD

## 2022-05-16 LAB — THYROID PANEL WITH TSH
Free Thyroxine Index: 1.8 (ref 1.2–4.9)
T3 Uptake Ratio: 25 % (ref 24–39)
T4, Total: 7 ug/dL (ref 4.5–12.0)
TSH: 0.699 u[IU]/mL (ref 0.450–4.500)

## 2022-09-13 DIAGNOSIS — K219 Gastro-esophageal reflux disease without esophagitis: Secondary | ICD-10-CM | POA: Diagnosis not present

## 2022-09-13 DIAGNOSIS — F330 Major depressive disorder, recurrent, mild: Secondary | ICD-10-CM | POA: Diagnosis not present

## 2022-09-13 DIAGNOSIS — Z79899 Other long term (current) drug therapy: Secondary | ICD-10-CM | POA: Diagnosis not present

## 2022-09-13 DIAGNOSIS — E785 Hyperlipidemia, unspecified: Secondary | ICD-10-CM | POA: Diagnosis not present

## 2022-09-13 DIAGNOSIS — I129 Hypertensive chronic kidney disease with stage 1 through stage 4 chronic kidney disease, or unspecified chronic kidney disease: Secondary | ICD-10-CM | POA: Diagnosis not present

## 2022-09-13 DIAGNOSIS — N1831 Chronic kidney disease, stage 3a: Secondary | ICD-10-CM | POA: Diagnosis not present

## 2022-09-13 DIAGNOSIS — R7303 Prediabetes: Secondary | ICD-10-CM | POA: Diagnosis not present

## 2022-11-24 DIAGNOSIS — H25812 Combined forms of age-related cataract, left eye: Secondary | ICD-10-CM | POA: Diagnosis not present

## 2022-12-07 DIAGNOSIS — H2512 Age-related nuclear cataract, left eye: Secondary | ICD-10-CM | POA: Diagnosis not present

## 2022-12-07 DIAGNOSIS — H269 Unspecified cataract: Secondary | ICD-10-CM | POA: Diagnosis not present

## 2022-12-07 DIAGNOSIS — H52222 Regular astigmatism, left eye: Secondary | ICD-10-CM | POA: Diagnosis not present

## 2022-12-07 DIAGNOSIS — H25812 Combined forms of age-related cataract, left eye: Secondary | ICD-10-CM | POA: Diagnosis not present

## 2022-12-21 DIAGNOSIS — H25811 Combined forms of age-related cataract, right eye: Secondary | ICD-10-CM | POA: Diagnosis not present

## 2022-12-21 DIAGNOSIS — H269 Unspecified cataract: Secondary | ICD-10-CM | POA: Diagnosis not present

## 2022-12-21 DIAGNOSIS — H52201 Unspecified astigmatism, right eye: Secondary | ICD-10-CM | POA: Diagnosis not present

## 2022-12-21 DIAGNOSIS — H2511 Age-related nuclear cataract, right eye: Secondary | ICD-10-CM | POA: Diagnosis not present

## 2023-02-20 DIAGNOSIS — M2062 Acquired deformities of toe(s), unspecified, left foot: Secondary | ICD-10-CM | POA: Diagnosis not present

## 2023-03-12 DIAGNOSIS — N1831 Chronic kidney disease, stage 3a: Secondary | ICD-10-CM | POA: Diagnosis not present

## 2023-03-12 DIAGNOSIS — K219 Gastro-esophageal reflux disease without esophagitis: Secondary | ICD-10-CM | POA: Diagnosis not present

## 2023-03-12 DIAGNOSIS — G250 Essential tremor: Secondary | ICD-10-CM | POA: Diagnosis not present

## 2023-03-12 DIAGNOSIS — E785 Hyperlipidemia, unspecified: Secondary | ICD-10-CM | POA: Diagnosis not present

## 2023-03-12 DIAGNOSIS — G3184 Mild cognitive impairment, so stated: Secondary | ICD-10-CM | POA: Diagnosis not present

## 2023-03-12 DIAGNOSIS — R7303 Prediabetes: Secondary | ICD-10-CM | POA: Diagnosis not present

## 2023-03-12 DIAGNOSIS — I129 Hypertensive chronic kidney disease with stage 1 through stage 4 chronic kidney disease, or unspecified chronic kidney disease: Secondary | ICD-10-CM | POA: Diagnosis not present

## 2023-03-12 DIAGNOSIS — F330 Major depressive disorder, recurrent, mild: Secondary | ICD-10-CM | POA: Diagnosis not present

## 2023-03-12 DIAGNOSIS — Z0000 Encounter for general adult medical examination without abnormal findings: Secondary | ICD-10-CM | POA: Diagnosis not present

## 2023-05-07 DIAGNOSIS — H02833 Dermatochalasis of right eye, unspecified eyelid: Secondary | ICD-10-CM | POA: Diagnosis not present

## 2023-05-29 DIAGNOSIS — M545 Low back pain, unspecified: Secondary | ICD-10-CM | POA: Diagnosis not present

## 2023-05-29 DIAGNOSIS — M25551 Pain in right hip: Secondary | ICD-10-CM | POA: Diagnosis not present

## 2023-06-07 DIAGNOSIS — M25551 Pain in right hip: Secondary | ICD-10-CM | POA: Diagnosis not present

## 2023-06-07 DIAGNOSIS — M791 Myalgia, unspecified site: Secondary | ICD-10-CM | POA: Diagnosis not present

## 2023-06-07 DIAGNOSIS — M545 Low back pain, unspecified: Secondary | ICD-10-CM | POA: Diagnosis not present

## 2023-06-12 DIAGNOSIS — M545 Low back pain, unspecified: Secondary | ICD-10-CM | POA: Diagnosis not present

## 2023-06-15 DIAGNOSIS — M545 Low back pain, unspecified: Secondary | ICD-10-CM | POA: Diagnosis not present

## 2023-06-19 DIAGNOSIS — M545 Low back pain, unspecified: Secondary | ICD-10-CM | POA: Diagnosis not present

## 2023-06-21 DIAGNOSIS — M545 Low back pain, unspecified: Secondary | ICD-10-CM | POA: Diagnosis not present

## 2023-06-26 DIAGNOSIS — M545 Low back pain, unspecified: Secondary | ICD-10-CM | POA: Diagnosis not present

## 2023-06-27 DIAGNOSIS — M545 Low back pain, unspecified: Secondary | ICD-10-CM | POA: Diagnosis not present

## 2023-07-03 DIAGNOSIS — M545 Low back pain, unspecified: Secondary | ICD-10-CM | POA: Diagnosis not present

## 2023-07-09 DIAGNOSIS — M48062 Spinal stenosis, lumbar region with neurogenic claudication: Secondary | ICD-10-CM | POA: Diagnosis not present

## 2023-07-17 DIAGNOSIS — M48062 Spinal stenosis, lumbar region with neurogenic claudication: Secondary | ICD-10-CM | POA: Diagnosis not present

## 2023-07-24 ENCOUNTER — Other Ambulatory Visit: Payer: Self-pay

## 2023-07-24 ENCOUNTER — Observation Stay (HOSPITAL_COMMUNITY)
Admission: EM | Admit: 2023-07-24 | Discharge: 2023-07-27 | Disposition: A | Discharge status: Home / Self Care | Attending: Internal Medicine | Admitting: Internal Medicine

## 2023-07-24 ENCOUNTER — Emergency Department (HOSPITAL_COMMUNITY)

## 2023-07-24 ENCOUNTER — Inpatient Hospital Stay (HOSPITAL_COMMUNITY)

## 2023-07-24 ENCOUNTER — Encounter (HOSPITAL_COMMUNITY): Payer: Self-pay

## 2023-07-24 DIAGNOSIS — G8929 Other chronic pain: Secondary | ICD-10-CM | POA: Insufficient documentation

## 2023-07-24 DIAGNOSIS — K219 Gastro-esophageal reflux disease without esophagitis: Secondary | ICD-10-CM | POA: Diagnosis not present

## 2023-07-24 DIAGNOSIS — E785 Hyperlipidemia, unspecified: Secondary | ICD-10-CM | POA: Diagnosis not present

## 2023-07-24 DIAGNOSIS — G4733 Obstructive sleep apnea (adult) (pediatric): Secondary | ICD-10-CM | POA: Diagnosis not present

## 2023-07-24 DIAGNOSIS — R634 Abnormal weight loss: Secondary | ICD-10-CM | POA: Diagnosis not present

## 2023-07-24 DIAGNOSIS — R55 Syncope and collapse: Secondary | ICD-10-CM | POA: Diagnosis not present

## 2023-07-24 DIAGNOSIS — Z7982 Long term (current) use of aspirin: Secondary | ICD-10-CM | POA: Diagnosis not present

## 2023-07-24 DIAGNOSIS — I129 Hypertensive chronic kidney disease with stage 1 through stage 4 chronic kidney disease, or unspecified chronic kidney disease: Secondary | ICD-10-CM | POA: Insufficient documentation

## 2023-07-24 DIAGNOSIS — R4182 Altered mental status, unspecified: Secondary | ICD-10-CM | POA: Diagnosis not present

## 2023-07-24 DIAGNOSIS — Z79899 Other long term (current) drug therapy: Secondary | ICD-10-CM | POA: Diagnosis not present

## 2023-07-24 DIAGNOSIS — Z96652 Presence of left artificial knee joint: Secondary | ICD-10-CM | POA: Diagnosis not present

## 2023-07-24 DIAGNOSIS — R7303 Prediabetes: Secondary | ICD-10-CM | POA: Diagnosis not present

## 2023-07-24 DIAGNOSIS — Z96611 Presence of right artificial shoulder joint: Secondary | ICD-10-CM | POA: Diagnosis not present

## 2023-07-24 DIAGNOSIS — M549 Dorsalgia, unspecified: Secondary | ICD-10-CM | POA: Insufficient documentation

## 2023-07-24 DIAGNOSIS — M545 Low back pain, unspecified: Secondary | ICD-10-CM | POA: Diagnosis not present

## 2023-07-24 DIAGNOSIS — Z96612 Presence of left artificial shoulder joint: Secondary | ICD-10-CM | POA: Diagnosis not present

## 2023-07-24 DIAGNOSIS — N1831 Chronic kidney disease, stage 3a: Secondary | ICD-10-CM | POA: Insufficient documentation

## 2023-07-24 DIAGNOSIS — R61 Generalized hyperhidrosis: Secondary | ICD-10-CM | POA: Diagnosis not present

## 2023-07-24 DIAGNOSIS — J45909 Unspecified asthma, uncomplicated: Secondary | ICD-10-CM | POA: Insufficient documentation

## 2023-07-24 DIAGNOSIS — Z6828 Body mass index (BMI) 28.0-28.9, adult: Secondary | ICD-10-CM | POA: Insufficient documentation

## 2023-07-24 DIAGNOSIS — R569 Unspecified convulsions: Secondary | ICD-10-CM | POA: Diagnosis not present

## 2023-07-24 LAB — CBC
HCT: 44.4 % (ref 39.0–52.0)
Hemoglobin: 14.5 g/dL (ref 13.0–17.0)
MCH: 29.2 pg (ref 26.0–34.0)
MCHC: 32.7 g/dL (ref 30.0–36.0)
MCV: 89.3 fL (ref 80.0–100.0)
Platelets: 212 10*3/uL (ref 150–400)
RBC: 4.97 MIL/uL (ref 4.22–5.81)
RDW: 14.2 % (ref 11.5–15.5)
WBC: 8.2 10*3/uL (ref 4.0–10.5)
nRBC: 0 % (ref 0.0–0.2)

## 2023-07-24 LAB — COMPREHENSIVE METABOLIC PANEL WITH GFR
ALT: 18 U/L (ref 0–44)
AST: 15 U/L (ref 15–41)
Albumin: 3.5 g/dL (ref 3.5–5.0)
Alkaline Phosphatase: 49 U/L (ref 38–126)
Anion gap: 13 (ref 5–15)
BUN: 27 mg/dL — ABNORMAL HIGH (ref 8–23)
CO2: 20 mmol/L — ABNORMAL LOW (ref 22–32)
Calcium: 9 mg/dL (ref 8.9–10.3)
Chloride: 104 mmol/L (ref 98–111)
Creatinine, Ser: 1.67 mg/dL — ABNORMAL HIGH (ref 0.61–1.24)
GFR, Estimated: 42 mL/min — ABNORMAL LOW (ref >60)
Glucose, Bld: 168 mg/dL — ABNORMAL HIGH (ref 70–99)
Potassium: 4 mmol/L (ref 3.5–5.1)
Sodium: 137 mmol/L (ref 135–145)
Total Bilirubin: 0.5 mg/dL (ref 0.0–1.2)
Total Protein: 6.2 g/dL — ABNORMAL LOW (ref 6.5–8.1)

## 2023-07-24 LAB — CBC WITH DIFFERENTIAL/PLATELET
Abs Immature Granulocytes: 0.05 10*3/uL (ref 0.00–0.07)
Basophils Absolute: 0 10*3/uL (ref 0.0–0.1)
Basophils Relative: 0 %
Eosinophils Absolute: 0.1 10*3/uL (ref 0.0–0.5)
Eosinophils Relative: 1 %
HCT: 42.9 % (ref 39.0–52.0)
Hemoglobin: 14.1 g/dL (ref 13.0–17.0)
Immature Granulocytes: 1 %
Lymphocytes Relative: 18 %
Lymphs Abs: 1.8 10*3/uL (ref 0.7–4.0)
MCH: 29.5 pg (ref 26.0–34.0)
MCHC: 32.9 g/dL (ref 30.0–36.0)
MCV: 89.7 fL (ref 80.0–100.0)
Monocytes Absolute: 0.5 10*3/uL (ref 0.1–1.0)
Monocytes Relative: 5 %
Neutro Abs: 7.5 10*3/uL (ref 1.7–7.7)
Neutrophils Relative %: 75 %
Platelets: 213 10*3/uL (ref 150–400)
RBC: 4.78 MIL/uL (ref 4.22–5.81)
RDW: 14 % (ref 11.5–15.5)
WBC: 10 10*3/uL (ref 4.0–10.5)
nRBC: 0 % (ref 0.0–0.2)

## 2023-07-24 LAB — AMMONIA: Ammonia: 17 umol/L (ref 9–35)

## 2023-07-24 LAB — TROPONIN I (HIGH SENSITIVITY)
Troponin I (High Sensitivity): 3 ng/L (ref <18)
Troponin I (High Sensitivity): 4 ng/L (ref <18)

## 2023-07-24 LAB — HEMOGLOBIN A1C
Hgb A1c MFr Bld: 6 % — ABNORMAL HIGH (ref 4.8–5.6)
Mean Plasma Glucose: 125.5 mg/dL

## 2023-07-24 LAB — TSH: TSH: 0.514 u[IU]/mL (ref 0.350–4.500)

## 2023-07-24 LAB — MAGNESIUM: Magnesium: 1.9 mg/dL (ref 1.7–2.4)

## 2023-07-24 LAB — CREATININE, SERUM
Creatinine, Ser: 1.46 mg/dL — ABNORMAL HIGH (ref 0.61–1.24)
GFR, Estimated: 50 mL/min — ABNORMAL LOW (ref >60)

## 2023-07-24 LAB — CBG MONITORING, ED: Glucose-Capillary: 126 mg/dL — ABNORMAL HIGH (ref 70–99)

## 2023-07-24 LAB — PHOSPHORUS: Phosphorus: 3.8 mg/dL (ref 2.5–4.6)

## 2023-07-24 MED ORDER — ACETAMINOPHEN 325 MG PO TABS
650.0000 mg | ORAL_TABLET | Freq: Four times a day (QID) | ORAL | Status: DC | PRN
  Administered 2023-07-25 (×2): 650 mg via ORAL
  Filled 2023-07-24 (×2): qty 2

## 2023-07-24 MED ORDER — ONDANSETRON HCL 4 MG/2ML IJ SOLN
4.0000 mg | Freq: Four times a day (QID) | INTRAMUSCULAR | Status: DC | PRN
  Administered 2023-07-26: 4 mg via INTRAVENOUS
  Filled 2023-07-24: qty 2

## 2023-07-24 MED ORDER — HEPARIN SODIUM (PORCINE) 5000 UNIT/ML IJ SOLN
5000.0000 [IU] | Freq: Three times a day (TID) | INTRAMUSCULAR | Status: DC
  Administered 2023-07-24 – 2023-07-27 (×8): 5000 [IU] via SUBCUTANEOUS
  Filled 2023-07-24 (×8): qty 1

## 2023-07-24 MED ORDER — SODIUM CHLORIDE 0.9% FLUSH
3.0000 mL | Freq: Two times a day (BID) | INTRAVENOUS | Status: DC
  Administered 2023-07-24 – 2023-07-27 (×5): 3 mL via INTRAVENOUS

## 2023-07-24 MED ORDER — ONDANSETRON HCL 4 MG PO TABS: 4.0000 mg | ORAL_TABLET | Freq: Four times a day (QID) | ORAL | Status: DC | PRN

## 2023-07-24 MED ORDER — ACETAMINOPHEN 650 MG RE SUPP: 650.0000 mg | Freq: Four times a day (QID) | RECTAL | Status: DC | PRN

## 2023-07-24 MED ORDER — SODIUM CHLORIDE 0.9 % IV SOLN: INTRAVENOUS | Status: AC

## 2023-07-24 NOTE — ED Provider Notes (Signed)
 [place] EMERGENCY DEPARTMENT AT [facility] Provider Note   CSN: [id number] Arrival date & time: 07/24/23  1538     History  Chief Complaint  Patient presents with   Seizure Activity    Corey Kelm. is a 78 y.o. male.  Patient is a 78 year old male with a history of hypertension, hyperlipidemia, kidney stones, CKD, prior alcohol use but has not had any alcohol since 2015, worsening chronic low back pain that has been bothering him more so recently requiring an injection last week and persistent Celebrex and muscle relaxer tizanidine  who is presenting today after a episode of loss of consciousness.  Patient reports that the patient is very immobile and just sleeps most of the days and is very inactive even worse now that he has been having back pain but he was sitting in his recliner and she started noticing that his right leg was shaking.  She then got up because he was not responding to her and she noticed he was shaking all over for maybe 10 to 15 seconds and then it took her a few minutes to get him to wake up.  She reports when he woke up he was asking why she was standing over him and what had happened.  He reports prior to the episode he noticed that his stomach felt a little bit crampy but he denied any chest pain or shortness of breath.  She reports after he woke up he was very pale and sweaty.  When EMS arrived his blood pressure was in the 90s.  She reports she did not check his pulse during this event.  He had recently started the Celebrex but has no other medication changes recently.  She reports for some time he has seemed very winded with any minimal activity but does not have any significant cardiac history that she is aware of.  He has had no unilateral leg pain or swelling.  He reports just feeling weak when he got up and transferred from chair to the stretcher and feels kind of fatigued but otherwise has no other complaints.  He reports that his abdomen feels  fine he is not having any chest pain or shortness of breath.  He denies any palpitations.  No change in his bowel or bladder habits.  He has been eating and drinking.  No prior history of seizures or syncope.  The history is provided by the patient, the spouse, the EMS personnel and medical records.       Home Medications Prior to Admission medications   Medication Sig Start Date End Date Taking? Authorizing Provider  aspirin  81 MG chewable tablet Chew 1 tablet (81 mg total) by mouth 2 (two) times daily. 08/06/19   Lucrezia Sachs, PA-C  atorvastatin  (LIPITOR) 10 MG tablet Take 10 mg by mouth at bedtime.     [provider]  bisoprolol -hydrochlorothiazide  (ZIAC ) 2.5-6.25 MG per tablet Take 1 tablet by mouth every evening.     [provider]  buPROPion (WELLBUTRIN XL) 150 MG 24 hr tablet Take 150 mg by mouth daily. 12/01/20   [provider]  escitalopram  (LEXAPRO ) 20 MG tablet Take 20 mg by mouth daily.    [provider]  meloxicam (MOBIC) 15 MG tablet Take 15 mg by mouth daily.    [provider]  mometasone (NASONEX) 50 MCG/ACT nasal spray Place 2 sprays into the nose 2 (two) times daily as needed (congestion). Patient not taking: Reported on 05/15/2022  [provider]  omeprazole (PRILOSEC) 40 MG capsule Take 40 mg by mouth daily.  10/28/12   [provider]      Allergies    Shellfish allergy, Guar gum, Xanthan gum, and Codeine    Review of Systems   Review of Systems  Physical Exam Updated Vital Signs BP 119/80   Pulse 72   Temp 98.6 F (37 C) (Oral)   Resp 14   SpO2 96%  Physical Exam Vitals and nursing note reviewed.  Constitutional:      General: He is not in acute distress.    Appearance: He is well-developed.  HENT:     Head: Normocephalic and atraumatic.  Eyes:     General: No visual field deficit.    Conjunctiva/sclera: Conjunctivae normal.     Pupils: Pupils are equal, round, and reactive to light.   Cardiovascular:     Rate and Rhythm: Normal rate and regular rhythm.     Pulses: Normal pulses.     Heart sounds: No murmur heard. Pulmonary:     Effort: Pulmonary effort is normal. No respiratory distress.     Breath sounds: Normal breath sounds. No wheezing or rales.  Abdominal:     General: There is no distension.     Palpations: Abdomen is soft.     Tenderness: There is no abdominal tenderness. There is no guarding or rebound.  Musculoskeletal:        General: No tenderness. Normal range of motion.     Cervical back: Normal range of motion and neck supple.     Right lower leg: No edema.     Left lower leg: No edema.  Skin:    General: Skin is warm and dry.     Findings: No erythema or rash.  Neurological:     Mental Status: He is alert and oriented to person, place, and time. Mental status is at baseline.     Cranial Nerves: No dysarthria or facial asymmetry.     Sensory: No sensory deficit.     Motor: No weakness or pronator drift.     Coordination: Coordination is intact. Finger-Nose-Finger Test and Heel to [facility] Test normal.  Psychiatric:        Behavior: Behavior normal.     ED Results / Procedures / Treatments   Labs (all labs ordered are listed, but only abnormal results are displayed) Labs Reviewed  COMPREHENSIVE METABOLIC PANEL WITH GFR - Abnormal; Notable for the following components:      Result Value   CO2 20 (*)    Glucose, Bld 168 (*)    BUN 27 (*)    Creatinine, Ser 1.67 (*)    Total Protein 6.2 (*)    GFR, Estimated 42 (*)    All other components within normal limits  CBC WITH DIFFERENTIAL/PLATELET  AMMONIA  TROPONIN I (HIGH SENSITIVITY)  TROPONIN I (HIGH SENSITIVITY)    EKG EKG Interpretation Date/Time:  Tuesday July 24 2023 15:43:41 EDT Ventricular Rate:  81 PR Interval:  157 QRS Duration:  94 QT Interval:  376 QTC Calculation: 437 R Axis:   114  Text Interpretation: Sinus rhythm Right axis deviation No significant change since last  tracing Confirmed by Almond Army ([id number]) on 07/24/2023 5:37:24 PM  Radiology CT Head Wo Contrast Result Date: 07/24/2023 CLINICAL DATA:  Mental status change EXAM: CT HEAD WITHOUT CONTRAST TECHNIQUE: Contiguous axial images were obtained from the base of the skull through the vertex without intravenous contrast. RADIATION DOSE REDUCTION: This  exam was performed according to the departmental dose-optimization program which includes automated exposure control, adjustment of the mA and/or kV according to patient size and/or use of iterative reconstruction technique. COMPARISON:  MRI brain 09/06/2020. FINDINGS: Brain: No evidence of acute infarction, hemorrhage, hydrocephalus, extra-axial collection or mass lesion/mass effect. Vascular: No hyperdense vessel or unexpected calcification. Skull: Normal. Negative for fracture or focal lesion. Sinuses/Orbits: No acute finding. Other: None. IMPRESSION: No acute intracranial abnormality. Electronically Signed   By: Tyron Gallon M.D.   On: 07/24/2023 17:58   DG Chest Port 1 View Result Date: 07/24/2023 CLINICAL DATA:  Seizure, syncope. EXAM: PORTABLE CHEST 1 VIEW COMPARISON:  July 24, 2019. FINDINGS: The heart size and mediastinal contours are within normal limits. Both lungs are clear. Status post bilateral shoulder arthroplasty. IMPRESSION: No active disease. Electronically Signed   By: Rosalene Colon M.D.   On: 07/24/2023 16:52    Procedures Procedures    Medications Ordered in ED Medications - No data to display  ED Course/ Medical Decision Making/ A&P                                 Medical Decision Making Amount and/or Complexity of Data Reviewed External Data Reviewed: notes. Labs: ordered. Decision-making details documented in ED Course. Radiology: ordered and independent interpretation performed. Decision-making details documented in ED Course. ECG/medicine tests: ordered and independent interpretation performed. Decision-making  details documented in ED Course.  Risk Decision regarding hospitalization.   Pt with multiple medical problems and comorbidities and presenting today with a complaint that caries a high risk for morbidity and mortality.  Here today after an episode at home.  Concern for seizure versus syncope related to possible dysrhythmia versus orthostasis.  However patient was in a reclined position when this occurred.  He did not bite his tongue or have incontinence.  He was hypotensive when EMS arrived which has improved spontaneously.  He is on continuous cardiac monitoring which I independently interpreted and is currently in sinus rhythm.  Patient has no focal deficits and low suspicion for stroke at this time.  I independently interpreted patient's EKG which shows normal sinus rhythm at this time and no ST changes concerning for ACS.  Lower suspicion for electrolyte abnormalities.  Patient does take tizanidine  due to recent back issues which would lower throat seizure threshold however he did not have a postictal event today.  No prior history of seizures.  He does not use substances that would result in withdrawal.  He is awake alert and in no acute distress at this time.  Vital signs are reassuring.  7:33 PM I independently interpreted patient's labs.  Troponin x 2 is negative, CBC is within normal limits, CMP with a creatinine of 1.67 which his wife reports he does have stage III kidney disease but otherwise normal LFTs and electrolytes.  Ammonia within normal limits.  I have independently visualized and interpreted pt's images today. Chest x-ray without acute findings and CT without evidence of bleed or mass.  Radiology reports both images are negative.  On repeat evaluation patient is still feeling slightly weak but otherwise still has no complaints.  At this time concern for syncopal event possibly from a dysrhythmia.  Patient's wife reports she has been very concerned about his shortness of breath with  exertion for a while now.  He has not had any recent echoes or cardiac evaluation.  Given patient's symptoms occurred while  laying down and there was not a position change or precipitating event prior.  Will admit for further evaluation including echo to ensure no other acute cardiac cause.  Consulted hospitalist for admission.         Final Clinical Impression(s) / ED Diagnoses Final diagnoses:  Syncope, unspecified syncope type    Rx / DC Orders ED Discharge Orders     None         Almond Army, MD 07/24/23 1933

## 2023-07-24 NOTE — H&P (Addendum)
 History and Physical    Corey Adams. ZHY:[id number] DOB: 1945-06-20 DOA: 07/24/2023  PCP: Sun, Vyvyan, MD  Patient coming from: home  I have personally briefly reviewed patient's old medical records in [facility]  Chief Complaint: syncope  HPI: Corey Alyea. is a 78 y.o. male with medical history significant of  HTN, HLD, CKDIIIa, Pre-dabetes, HLD, GERD,hx of ETOH abuse now in remission,Depression,OSA , chronic back pain/spinal stenosis with recent flare for which he is being treated with spinal steroid injections as well as nsaids and muscle relaxants.  Patent presents to ED BIB EMS s/p syncope episode of note on arrival of EMS his bp was note to be 90' systolic.  Per wife who gave history , she initially noted his left leg shaking , she called to him but he did not respond. She when over the check on him and he was not responsive and she then noted within that time that his shaking has become generalized. She notes he had not bladder or bowel incontinence. She states after shaking when he awoke he was back to baseline without noted focal neuro findings and no noted confusion. Patient noted no prior episode like this in the past. He notes he does not remember much from episode. He notes he remember waking up with his wife standing over him.  He did however endorse feeling nauseated prior to episode.  He notes no dysuria, no chest pain , recurrent n/v, abdominal pain, cough fever/chills/ sob. He does however note interim history of worsening back pain and due to this has been more sedentary of late. Patient of note however feels back to his baseline and has no current complaints.  ED Course:  Vitals:afeb  bp 107/72, hr 82, rr 13, sat 98%  Wbc: 10, hgb 14.1, plt213 Ammonia: 17 Na 137, KJ 4, CL 104, bicarb 20, Gly 168,  cr 1.67 CE 3 CXR: NAD  CTH NAD  EKG: Nsr, RAD, no hyperacute st -twave changes  Review of Systems: As per HPI otherwise 10 point review of systems  negative.   Past Medical History:  Diagnosis Date   Actinic keratitis    Alcohol abuse    history no drink since 2015   Anxiety    Arthritis    "left thumb" (03/05/2014)   Childhood asthma    Chronic lower back pain    CKD (chronic kidney disease), stage III (HCC)    Complication of anesthesia    difficulty urinating after anesthesia   Depression    Esophageal reflux    GERD (gastroesophageal reflux disease)    Hyperlipidemia    Hypertension    Kidney stones    "plenty; no ORs" (03/05/2014)   Memory change    Migraine    "last one was in the 1990's" (03/05/2014)   Multiple allergies    "sinus stays swollen all the time"   Pre-diabetes    Sleep apnea    "have mask; don't use it" (03/05/2014)    Past Surgical History:  Procedure Laterality Date   CARPAL TUNNEL RELEASE Bilateral 1990's   COLON SURGERY     DISTAL CLAVICLE EXCISION Right 03/05/2014   EXCISIONAL HEMORRHOIDECTOMY  1983   LAPAROSCOPIC LOW ANTERIOR RESECTION N/A 10/30/2012   Procedure: LAPAROSCOPIC LOW ANTERIOR RESECTION with Rigid Proctoscopy;  Surgeon: Mayme Spearman, MD;  Location: [facility] OR;  Service: General;  Laterality: N/A;   NASAL SINUS SURGERY  1990's   TOTAL KNEE ARTHROPLASTY Left 08/05/2019   Procedure:  LEFT TOTAL KNEE ARTHROPLASTY;  Surgeon: Dayne Even, MD;  Location: [facility] ORS;  Service: Orthopedics;  Laterality: Left;   TOTAL SHOULDER ARTHROPLASTY Right 03/05/2014   Procedure: TOTAL SHOULDER ARTHROPLASTY;  Surgeon: Derald Flattery, MD;  Location: [facility] OR;  Service: Orthopedics;  Laterality: Right;  Right shoulder replacement, distal clavicle excision   TOTAL SHOULDER ARTHROPLASTY Left 09/07/2016   TOTAL SHOULDER ARTHROPLASTY Left 09/07/2016   Procedure: TOTAL SHOULDER ARTHROPLASTY;  Surgeon: Sammye Cristal, MD;  Location: [facility] OR;  Service: Orthopedics;  Laterality: Left;  Left total shoulder arthroplasty   VASECTOMY       reports that he has never smoked. He has never used smokeless tobacco. He  reports current alcohol use. He reports current drug use. Drug: Marijuana.  Allergies  Allergen Reactions   Shellfish Allergy Anaphylaxis   Guar Gum Hives   Xanthan Gum Hives   Codeine Nausea And Vomiting    Family History  Problem Relation Age of Onset   Heart attack Mother    Heart disease Father    Heart attack Father    Hypercholesterolemia Sister     Prior to Admission medications   Medication Sig Start Date End Date Taking? Authorizing Provider  aspirin  81 MG chewable tablet Chew 1 tablet (81 mg total) by mouth 2 (two) times daily. 08/06/19   Corey Sachs, PA-C  atorvastatin  (LIPITOR) 10 MG tablet Take 10 mg by mouth at bedtime.     [provider]  bisoprolol -hydrochlorothiazide  (ZIAC ) 2.5-6.25 MG per tablet Take 1 tablet by mouth every evening.     [provider]  buPROPion (WELLBUTRIN XL) 150 MG 24 hr tablet Take 150 mg by mouth daily. 12/01/20   [provider]  escitalopram  (LEXAPRO ) 20 MG tablet Take 20 mg by mouth daily.    [provider]  meloxicam (MOBIC) 15 MG tablet Take 15 mg by mouth daily.    [provider]  mometasone (NASONEX) 50 MCG/ACT nasal spray Place 2 sprays into the nose 2 (two) times daily as needed (congestion). Patient not taking: Reported on 05/15/2022    [provider]  omeprazole (PRILOSEC) 40 MG capsule Take 40 mg by mouth daily.  10/28/12   [provider]    Physical Exam: Vitals:   07/24/23 1545 07/24/23 1600 07/24/23 1900 07/24/23 1913  BP: 110/80 107/72 119/80   Pulse: 72 82 72   Resp:  13 14   Temp:    98.6 F (37 C)  TempSrc:    Oral  SpO2: 100% 98% 96%     Constitutional: NAD, calm, comfortable Vitals:   07/24/23 1545 07/24/23 1600 07/24/23 1900 07/24/23 1913  BP: 110/80 107/72 119/80   Pulse: 72 82 72   Resp:  13 14   Temp:    98.6 F (37 C)  TempSrc:    Oral  SpO2: 100% 98% 96%    Eyes: PERRL, lids and conjunctivae normal ENMT: Mucous membranes are moist.  Posterior pharynx clear of any exudate or lesions.Normal dentition.  Neck: normal, supple, no masses, no thyromegaly Respiratory: clear to auscultation bilaterally, no wheezing, no crackles. Normal respiratory effort. No accessory muscle use.  Cardiovascular: Regular rate and rhythm, no murmurs / rubs / gallops. No extremity edema. 2+ pedal pulses. No carotid bruits.  Abdomen: no tenderness, no masses palpated. No hepatosplenomegaly. Bowel sounds positive.  Musculoskeletal: no clubbing / cyanosis. No joint deformity upper and lower extremities. Good ROM, no contractures. Normal muscle tone.  Skin: no rashes, lesions, ulcers. No induration  Neurologic: CN 2-12 grossly intact. Sensation intact, DTR normal. Strength 5/5 in all 4.  Psychiatric: Normal judgment and insight. Alert and oriented x 3. Normal mood.    Labs on Admission: I have personally reviewed following labs and imaging studies  CBC: Recent Labs  Lab 07/24/23 1618  WBC 10.0  NEUTROABS 7.5  HGB 14.1  HCT 42.9  MCV 89.7  PLT 213   Basic Metabolic Panel: Recent Labs  Lab 07/24/23 1618  NA 137  K 4.0  CL 104  CO2 20*  GLUCOSE 168*  BUN 27*  CREATININE 1.67*  CALCIUM  9.0   GFR: CrCl cannot be calculated (Unknown ideal weight.). Liver Function Tests: Recent Labs  Lab 07/24/23 1618  AST 15  ALT 18  ALKPHOS 49  BILITOT 0.5  PROT 6.2*  ALBUMIN  3.5   No results for input(s): "LIPASE", "AMYLASE" in the last 168 hours. Recent Labs  Lab 07/24/23 1618  AMMONIA 17   Coagulation Profile: No results for input(s): "INR", "PROTIME" in the last 168 hours. Cardiac Enzymes: No results for input(s): "CKTOTAL", "CKMB", "CKMBINDEX", "TROPONINI" in the last 168 hours. BNP (last 3 results) No results for input(s): "PROBNP" in the last 8760 hours. HbA1C: No results for input(s): "HGBA1C" in the last 72 hours. CBG: No results for input(s): "GLUCAP" in the last 168 hours. Lipid Profile: No results for input(s): "CHOL",  "HDL", "LDLCALC", "TRIG", "CHOLHDL", "LDLDIRECT" in the last 72 hours. Thyroid  Function Tests: No results for input(s): "TSH", "T4TOTAL", "FREET4", "T3FREE", "THYROIDAB" in the last 72 hours. Anemia Panel: No results for input(s): "VITAMINB12", "FOLATE", "FERRITIN", "TIBC", "IRON", "RETICCTPCT" in the last 72 hours. Urine analysis:    Component Value Date/Time   COLORURINE YELLOW 07/24/2019 1419   APPEARANCEUR CLEAR 07/24/2019 1419   LABSPEC 1.020 07/24/2019 1419   PHURINE 5.0 07/24/2019 1419   GLUCOSEU NEGATIVE 07/24/2019 1419   HGBUR SMALL (A) 07/24/2019 1419   BILIRUBINUR NEGATIVE 07/24/2019 1419   KETONESUR NEGATIVE 07/24/2019 1419   PROTEINUR NEGATIVE 07/24/2019 1419   UROBILINOGEN 0.2 02/20/2014 1318   NITRITE NEGATIVE 07/24/2019 1419   LEUKOCYTESUR SMALL (A) 07/24/2019 1419    Radiological Exams on Admission: CT Head Wo Contrast Result Date: 07/24/2023 CLINICAL DATA:  Mental status change EXAM: CT HEAD WITHOUT CONTRAST TECHNIQUE: Contiguous axial images were obtained from the base of the skull through the vertex without intravenous contrast. RADIATION DOSE REDUCTION: This exam was performed according to the departmental dose-optimization program which includes automated exposure control, adjustment of the mA and/or kV according to patient size and/or use of iterative reconstruction technique. COMPARISON:  MRI brain 09/06/2020. FINDINGS: Brain: No evidence of acute infarction, hemorrhage, hydrocephalus, extra-axial collection or mass lesion/mass effect. Vascular: No hyperdense vessel or unexpected calcification. Skull: Normal. Negative for fracture or focal lesion. Sinuses/Orbits: No acute finding. Other: None. IMPRESSION: No acute intracranial abnormality. Electronically Signed   By: Tyron Gallon M.D.   On: 07/24/2023 17:58   DG Chest Port 1 View Result Date: 07/24/2023 CLINICAL DATA:  Seizure, syncope. EXAM: PORTABLE CHEST 1 VIEW COMPARISON:  July 24, 2019. FINDINGS: The heart  size and mediastinal contours are within normal limits. Both lungs are clear. Status post bilateral shoulder arthroplasty. IMPRESSION: No active disease. Electronically Signed   By: Rosalene Colon M.D.   On: 07/24/2023 16:52    EKG: Independently reviewed. See above  Assessment/Plan  Syncope Nos   -in the filed patient systolic was 90's , patient had  prodrome of nausea/stomach upset w/o emesis, also episode of shaking  during episode -possible orthostatic vs vasovagal, vs seizure , which is less likely as no postictal period vs possible cardiac arrhythmia  vs pulmonary -PHTN in setting of untreated OSA  -admit to cardiac tele  -orthostatic vitals  -place on neuro checks -EEG to be complete  -MRI , ECHO , pending for am  -d-dimer r/o PE due hx of sedentary lifestyle -gentle ivf overnight   CKDIII -cr close to prior baseline  -avoid nephrotoxic medications   HTN  - hold all anti htn medication in setting of psycho and recent bp 90's systolic  -resume home medications as needed   HLD -continue atorvastatin    Pre-diabetes -monitor poc  - last A1c 6.1 years prior , A1c pending for am   GERD -continue ppi   OSA -non complaint with cpap  - Corey Adams at bedtime   Chronic back pain  -supportive care  - avoid sedative medications    DVT prophylaxis: heparin  Code Status: full/ as discussed per patient wishes in event of cardiac arrest  Family Communication: wife at bedside [phone] Corey Adams   Disposition Plan:patient  expected to be admitted less than 2 midnights  Consults called: none Admission status: cardiac tele   Sabas Cradle MD [facility]   If 7PM-7AM, please contact night-coverage [email or web address] Password [facility]  07/24/2023, 8:30 PM

## 2023-07-24 NOTE — ED Triage Notes (Signed)
 PT BIB EMS, was called by wife for seizure like activity for a few seconds, patient was sitting in a chair.  No history of seizures.   98/68 110/pal HR 76 97% RA CBG 114 20 Right AC

## 2023-07-24 NOTE — ED Notes (Signed)
 Patient transported to MRI

## 2023-07-24 NOTE — ED Notes (Signed)
 CCMD called and verified that patient has been placed on cardiac monitoring

## 2023-07-25 ENCOUNTER — Inpatient Hospital Stay (HOSPITAL_COMMUNITY)

## 2023-07-25 ENCOUNTER — Observation Stay (HOSPITAL_COMMUNITY)

## 2023-07-25 DIAGNOSIS — N1831 Chronic kidney disease, stage 3a: Secondary | ICD-10-CM | POA: Diagnosis not present

## 2023-07-25 DIAGNOSIS — R569 Unspecified convulsions: Secondary | ICD-10-CM

## 2023-07-25 DIAGNOSIS — R55 Syncope and collapse: Secondary | ICD-10-CM | POA: Diagnosis not present

## 2023-07-25 LAB — COMPREHENSIVE METABOLIC PANEL WITH GFR
ALT: 16 U/L (ref 0–44)
AST: 15 U/L (ref 15–41)
Albumin: 3.3 g/dL — ABNORMAL LOW (ref 3.5–5.0)
Alkaline Phosphatase: 44 U/L (ref 38–126)
Anion gap: 10 (ref 5–15)
BUN: 23 mg/dL (ref 8–23)
CO2: 20 mmol/L — ABNORMAL LOW (ref 22–32)
Calcium: 9.1 mg/dL (ref 8.9–10.3)
Chloride: 107 mmol/L (ref 98–111)
Creatinine, Ser: 1.42 mg/dL — ABNORMAL HIGH (ref 0.61–1.24)
GFR, Estimated: 51 mL/min — ABNORMAL LOW (ref >60)
Glucose, Bld: 93 mg/dL (ref 70–99)
Potassium: 3.8 mmol/L (ref 3.5–5.1)
Sodium: 137 mmol/L (ref 135–145)
Total Bilirubin: 0.6 mg/dL (ref 0.0–1.2)
Total Protein: 6.1 g/dL — ABNORMAL LOW (ref 6.5–8.1)

## 2023-07-25 LAB — GLUCOSE, CAPILLARY
Glucose-Capillary: 99 mg/dL (ref 70–99)
Glucose-Capillary: 91 mg/dL (ref 70–99)

## 2023-07-25 LAB — D-DIMER, QUANTITATIVE: D-Dimer, Quant: 0.48 ug{FEU}/mL (ref 0.00–0.50)

## 2023-07-25 LAB — CBG MONITORING, ED
Glucose-Capillary: 93 mg/dL (ref 70–99)
Glucose-Capillary: 91 mg/dL (ref 70–99)

## 2023-07-25 MED ORDER — LIDOCAINE 5 % EX PTCH
1.0000 | MEDICATED_PATCH | CUTANEOUS | Status: DC
  Administered 2023-07-25 – 2023-07-26 (×2): 1 via TRANSDERMAL
  Filled 2023-07-25 (×2): qty 1

## 2023-07-25 MED ORDER — ACETAMINOPHEN 500 MG PO TABS
1000.0000 mg | ORAL_TABLET | Freq: Three times a day (TID) | ORAL | Status: DC
  Administered 2023-07-25 – 2023-07-27 (×5): 1000 mg via ORAL
  Filled 2023-07-25 (×5): qty 2

## 2023-07-25 MED ORDER — TRAMADOL HCL 50 MG PO TABS
50.0000 mg | ORAL_TABLET | Freq: Two times a day (BID) | ORAL | Status: DC | PRN
  Administered 2023-07-25: 50 mg via ORAL
  Filled 2023-07-25 (×2): qty 1

## 2023-07-25 NOTE — Procedures (Signed)
 Patient Name: Corey Adams.  MRN: [id number]  Epilepsy Attending: Arleene Lack  Referring Physician/Provider: Casey Clay, MD  Date: 07/25/2023  Duration: 23.48 mins  Patient history: 78yo M with syncope. EEG to evaluate for seizure  Level of alertness: Awake  AEDs during EEG study: None  Technical aspects: This EEG study was done with scalp electrodes positioned according to the 10-20 International system of electrode placement. Electrical activity was reviewed with band pass filter of 1-70Hz , sensitivity of 7 uV/mm, display speed of 87mm/sec with a 60Hz  notched filter applied as appropriate. EEG data were recorded continuously and digitally stored.  Video monitoring was available and reviewed as appropriate.  Description: The posterior dominant rhythm consists of 8-9 Hz activity of moderate voltage (25-35 uV) seen predominantly in posterior head regions, symmetric and reactive to eye opening and eye closing. Physiologic photic driving was not seen during photic stimulation.  Hyperventilation was not performed.     IMPRESSION: This study is within normal limits. No seizures or epileptiform discharges were seen throughout the recording.  A normal interictal EEG does not exclude the diagnosis of epilepsy.  Corey Adams

## 2023-07-25 NOTE — ED Notes (Signed)
 Per MD, no need for speech eval.

## 2023-07-25 NOTE — Progress Notes (Signed)
 SLP Cancellation Note  Patient Details Name: Corey Adams. MRN: [id number] DOB: 1946/02/11   Cancelled treatment:        SLP orders entered but without specification for reason for evaluation. Spoke with nursing who confirmed with MD that pt has no ST needs. SLP will complete orders and sign off.    Elester Grim, MA, CCC-SLP Acute Rehabilitation Services Office: [phone] 07/25/2023, 9:27 AM

## 2023-07-25 NOTE — Care Management Obs Status (Signed)
 MEDICARE OBSERVATION STATUS NOTIFICATION   Patient Details  Name: Corey Adams. MRN: [id number] Date of Birth: 05-Nov-1945   Medicare Observation Status Notification Given:  Yes    Joanette Moynahan, RN 07/25/2023, 1:23 PM

## 2023-07-25 NOTE — Progress Notes (Signed)
 Transition of Care [facility]) - Inpatient Brief Assessment   Patient Details  Name: Corey Adams. MRN: [id number] Date of Birth: 12-29-1945  Transition of [facility] CM/SW Contact:    Joanette Moynahan, RN Phone Number: 07/25/2023, 1:21 PM   Clinical Narrative: was called by wife for seizure like activity for a few seconds, patient was sitting in a chair. No history of seizures.    Transition of Care Asessment: Insurance and Status: (P) Insurance coverage has been reviewed Patient has primary care physician: (P) Yes Home environment has been reviewed: (P) home with spouse Prior level of function:: (P) independent Prior/Current Home Services: (P) No current home services   Readmission risk has been reviewed: (P) Yes Transition of care needs: (P) transition of care needs identified, TOC will continue to follow

## 2023-07-25 NOTE — Plan of Care (Signed)
  Problem: Physical Regulation: Goal: Complications related to the disease process, condition or treatment will be avoided or minimized Outcome: Progressing   Problem: Education: Goal: Knowledge of General Education information will improve Description: Including pain rating scale, medication(s)/side effects and non-pharmacologic comfort measures Outcome: Progressing   Problem: Education: Goal: Knowledge of condition and prescribed therapy will improve Outcome: Progressing

## 2023-07-25 NOTE — ED Notes (Signed)
 OT at bedside.

## 2023-07-25 NOTE — Evaluation (Signed)
 Physical Therapy Evaluation and Discharge  Patient Details Name: Corey Adams. MRN: [id number] DOB: Aug 19, 1945 Today's Date: 07/25/2023  History of Present Illness  Pt is a 78 y/o male presenting on 4/22 with seizure activity. CT/MRI brain negative. Pending work up, orthostatic vs vasovagal. PMH includes: anxiety, CDK, HTN, L TKA, B total shoulder arthroplasty.  Clinical Impression  Patient is agreeable to PT evaluation. He uses a cane for ambulation at baseline due to chronic right hip pain. He lives with his spouse.  Today the patient is Mod I for bed mobility. Supervision provided for transfers with standing balance improved with rolling walker. Supervision provided for hallway ambulation using rolling walker due to chronic right hip pain. Patient is fatigued with activity which he reports is his baseline. No dizziness reported with mobility with vitals stable throughout session. No apparent acute PT needs as patient appears to be at his baseline. PT will sign off at this time.     If plan is discharge home, recommend the following: Assist for transportation   Can travel by private vehicle        Equipment Recommendations None recommended by PT  Recommendations for Other Services       Functional Status Assessment Patient has not had a recent decline in their functional status     Precautions / Restrictions Precautions Precautions: Fall Recall of Precautions/Restrictions: Intact Restrictions Weight Bearing Restrictions Per Provider Order: No      Mobility  Bed Mobility Overal bed mobility: Modified Independent                  Transfers Overall transfer level: Needs assistance Equipment used: None Transfers: Sit to/from Stand Sit to Stand: Supervision           General transfer comment: initially reaching out for furniture for support with improved balance using rolling walker after standing    Ambulation/Gait Ambulation/Gait assistance:  Supervision Gait Distance (Feet): 75 Feet Assistive device: Rolling walker (2 wheels) Gait Pattern/deviations: Step-through pattern, Decreased stance time - right, Antalgic Gait velocity: decreased     General Gait Details: no loss of balance using rolling walker for support in standing. encouraged patient to use rolling walker as needed at home for balance, safety, and due to chronic hip pain with loger distance ambulation bouts  Stairs            Wheelchair Mobility     Tilt Bed    Modified Rankin (Stroke Patients Only)       Balance Overall balance assessment: Needs assistance Sitting-balance support: No upper extremity supported, Feet supported Sitting balance-Leahy Scale: Good     Standing balance support: Bilateral upper extremity supported, During functional activity, Single extremity supported Standing balance-Leahy Scale: Fair Standing balance comment: using rolling walker for support in standing                             Pertinent Vitals/Pain Pain Assessment Pain Assessment: 0-10 Pain Score: 3  Pain Location: R hip Pain Descriptors / Indicators: Discomfort Pain Intervention(s): Monitored during session, Limited activity within patient's tolerance    Home Living Family/patient expects to be discharged to:: Private residence Living Arrangements: Spouse/significant other Available Help at Discharge: Family Type of Home: House Home Access: Stairs to enter Entrance Stairs-Rails: Doctor, general practice of Steps: 4   Home Layout: One level Home Equipment: Cane - single Acupuncturist (4 wheels)      Prior Function Prior Level  of Function : Independent/Modified Independent             Mobility Comments: independent mobility, more than 67ft using cane ADLs Comments: indepedent ADLs, light IADLs     Extremity/Trunk Assessment   Upper Extremity Assessment Upper Extremity Assessment: Defer to OT  evaluation    Lower Extremity Assessment Lower Extremity Assessment: RLE deficits/detail RLE Deficits / Details: chronic R hip pain       Communication   Communication Communication: No apparent difficulties    Cognition Arousal: Alert Behavior During Therapy: WFL for tasks assessed/performed   PT - Cognitive impairments: No apparent impairments                         Following commands: Intact       Cueing Cueing Techniques: Verbal cues     General Comments General comments (skin integrity, edema, etc.): vitals stable thropughout session with BP 137/84 after walking. no dizziness reported with upright activity    Exercises     Assessment/Plan    PT Assessment Patient does not need any further PT services  PT Problem List         PT Treatment Interventions      PT Goals (Current goals can be found in the Care Plan section)  Acute Rehab PT Goals PT Goal Formulation: All assessment and education complete, DC therapy    Frequency       Co-evaluation               AM-PAC PT "6 Clicks" Mobility  Outcome Measure Help needed turning from your back to your side while in a flat bed without using bedrails?: None Help needed moving from lying on your back to sitting on the side of a flat bed without using bedrails?: None Help needed moving to and from a bed to a chair (including a wheelchair)?: None Help needed standing up from a chair using your arms (e.g., wheelchair or bedside chair)?: None Help needed to walk in hospital room?: None Help needed climbing 3-5 steps with a railing? : A Little 6 Click Score: 23    End of Session   Activity Tolerance: Patient tolerated treatment well Patient left: in bed;with call bell/phone within reach   PT Visit Diagnosis: Muscle weakness (generalized) (M62.81)    Time: 1610-9604 PT Time Calculation (min) (ACUTE ONLY): 16 min   Charges:   PT Evaluation $PT Eval Low Complexity: 1 Low   PT General  Charges $$ ACUTE PT VISIT: 1 Visit         Ozie Bo, PT, MPT   Erlene Hawks 07/25/2023, 10:14 AM

## 2023-07-25 NOTE — Progress Notes (Signed)
EEG complete, results pending 

## 2023-07-25 NOTE — ED Notes (Signed)
 Pt denies any headache or dizziness currently.

## 2023-07-25 NOTE — Progress Notes (Signed)
 PROGRESS NOTE   Corey Adams.  TDD:[id number]    DOB: [birth year]    DOA: 07/24/2023  PCP: Sun, Vyvyan, MD   I have briefly reviewed patients previous medical records in [facility].   Brief Hospital Course:  78 y.o. married male, independent, with medical history significant for HTN, HLD, CKDIIIa, Pre-dabetes, HLD, GERD, alcohol use disorder now in remission, depression, OSA, chronic back pain/spinal stenosis with recent flare for which he received spinal steroid injections as well as NSAIDs and muscle relaxants, presented to the ED, brought in by EMS after a syncopal episode at home while sitting on his recliner.  EMS noted SBP of 90.  The syncopal episode was associated with involuntary generalized shaking without bowel or bladder incontinence.  He was nauseous prior to the episode.  Admitted for evaluation of syncope.  Assessment & Plan:  Syncope History as noted above.  Reportedly occurred when he was laying almost "flat" on his recliner.  Apart from mild nausea, did not have any premonitory symptoms.  No prior history of syncope DD: Hypotension/orthostatic hypotension, vasovagal, low index of suspicion for seizures MRI brain normal.  CT head without acute abnormalities. HS Troponin x 2 negative.  TSH normal.  A1c 6.  Telemetry shows sinus rhythm currently.  D-dimer negative.  Only other lab abnormalities elevated creatinine, do not know if this is subacute or chronic Orthostatics requested and yet to be performed.  2D echo ordered and pending.  EEG. Patient has been counseled that he should not drive x 6 months or until cleared by his physicians during outpatient office visit.  He verbalized understanding.  Stage IIIa chronic kidney disease Last known creatinine in April 2021: 1.45.  Presented with creatinine of 1.67.  Follow BMP in a.m. post hydration Monitor  HTN  - hold all anti htn medication in setting of psycho and recent bp 90's systolic  -resume home medications as  needed    HLD Continue atorvastatin     Pre-diabetes Hemoglobin A1c 6.   GERD continue ppi    OSA -non complaint with cpap  - [place] at bedtime    Chronic back pain  -supportive care  - avoid sedative medications   Body mass index is 28.89 kg/m.   DVT prophylaxis: heparin  injection 5,000 Units Start: 07/24/23 2200     Code Status: Full Code:  Family Communication: None at bedside Disposition:  Status is: Inpatient Not inpatient appropriate, will call UM team and downgrade to OBS.      Consultants:     Procedures:     Subjective:  Patient denies complaints.  Denies having chest pain, palpitation, dizziness or lightheadedness before the event.  Reports no such prior events.  Does drive.  Retired.  Objective:   Vitals:   07/25/23 0615 07/25/23 0730 07/25/23 0821 07/25/23 0821  BP: 120/83 120/85    Pulse: 76 67    Resp: 16 13    Temp:      TempSrc:      SpO2: 100% 99% 100%   Weight:    86.2 kg  Height:    5\' 8"  (1.727 m)    General exam: Elderly male, moderately built and nourished lying comfortably propped up in bed without distress. Respiratory system: Clear to auscultation. Respiratory effort normal. Cardiovascular system: S1 & S2 heard, RRR. No JVD, murmurs, rubs, gallops or clicks. No pedal edema.  Telemetry personally reviewed: Sinus rhythm. Gastrointestinal system: Abdomen is nondistended, soft and nontender. No organomegaly or masses felt. Normal bowel  sounds heard. Central nervous system: Alert and oriented. No focal neurological deficits. Extremities: Symmetric 5 x 5 power. Skin: No rashes, lesions or ulcers Psychiatry: Judgement and insight appear normal. Mood & affect appropriate.     Data Reviewed:   I have personally reviewed following labs and imaging studies   CBC: Recent Labs  Lab 07/24/23 1618 07/24/23 2125  WBC 10.0 8.2  NEUTROABS 7.5  --   HGB 14.1 14.5  HCT 42.9 44.4  MCV 89.7 89.3  PLT 213 212    Basic Metabolic  Panel: Recent Labs  Lab 07/24/23 1618 07/24/23 2125  NA 137  --   K 4.0  --   CL 104  --   CO2 20*  --   GLUCOSE 168*  --   BUN 27*  --   CREATININE 1.67* 1.46*  CALCIUM  9.0  --   MG  --  1.9  PHOS  --  3.8    Liver Function Tests: Recent Labs  Lab 07/24/23 1618  AST 15  ALT 18  ALKPHOS 49  BILITOT 0.5  PROT 6.2*  ALBUMIN  3.5    CBG: Recent Labs  Lab 07/24/23 2327 07/25/23 0603 07/25/23 0755  GLUCAP 126* 93 91    Microbiology Studies:  No results found for this or any previous visit (from the past 240 hours).  Radiology Studies:  MR BRAIN WO CONTRAST Result Date: 07/24/2023 CLINICAL DATA:  Syncope EXAM: MRI HEAD WITHOUT CONTRAST TECHNIQUE: Multiplanar, multiecho pulse sequences of the brain and surrounding structures were obtained without intravenous contrast. COMPARISON:  09/06/2020 FINDINGS: Brain: No acute infarct, mass effect or extra-axial collection. No acute or chronic hemorrhage. Normal white matter signal, parenchymal volume and CSF spaces. The midline structures are normal. Vascular: Normal flow voids. Skull and upper cervical spine: Normal calvarium and skull base. Visualized upper cervical spine and soft tissues are normal. Sinuses/Orbits:No paranasal sinus fluid levels or advanced mucosal thickening. No mastoid or middle ear effusion. Normal orbits. IMPRESSION: Normal brain MRI. Electronically Signed   By: Juanetta Nordmann M.D.   On: 07/24/2023 23:38   CT Head Wo Contrast Result Date: 07/24/2023 CLINICAL DATA:  Mental status change EXAM: CT HEAD WITHOUT CONTRAST TECHNIQUE: Contiguous axial images were obtained from the base of the skull through the vertex without intravenous contrast. RADIATION DOSE REDUCTION: This exam was performed according to the departmental dose-optimization program which includes automated exposure control, adjustment of the mA and/or kV according to patient size and/or use of iterative reconstruction technique. COMPARISON:  MRI brain  09/06/2020. FINDINGS: Brain: No evidence of acute infarction, hemorrhage, hydrocephalus, extra-axial collection or mass lesion/mass effect. Vascular: No hyperdense vessel or unexpected calcification. Skull: Normal. Negative for fracture or focal lesion. Sinuses/Orbits: No acute finding. Other: None. IMPRESSION: No acute intracranial abnormality. Electronically Signed   By: Tyron Gallon M.D.   On: 07/24/2023 17:58   DG Chest Port 1 View Result Date: 07/24/2023 CLINICAL DATA:  Seizure, syncope. EXAM: PORTABLE CHEST 1 VIEW COMPARISON:  July 24, 2019. FINDINGS: The heart size and mediastinal contours are within normal limits. Both lungs are clear. Status post bilateral shoulder arthroplasty. IMPRESSION: No active disease. Electronically Signed   By: Rosalene Colon M.D.   On: 07/24/2023 16:52    Scheduled Meds:    heparin   5,000 Units Subcutaneous Q8H   sodium chloride  flush  3 mL Intravenous Q12H    Continuous Infusions:    sodium chloride  75 mL/hr at 07/25/23 0555     LOS: 1 day  Aubrey Blas, MD,  FACP, [facility], [facility], [facility]   [facility] [place]      To contact the attending provider between 7A-7P or the covering provider during after hours 7P-7A, please log into the web site [email or web address] and access using universal [place] password for that web site. If you do not have the password, please call the hospital operator.  07/25/2023, 8:43 AM

## 2023-07-25 NOTE — ED Notes (Signed)
Please update wife

## 2023-07-25 NOTE — Evaluation (Signed)
 Occupational Therapy Evaluation Patient Details Name: Corey Adams. MRN: [id number] DOB: [birth year] Today's Date: 07/25/2023   History of Present Illness   Pt is a 78 y/o male presenting on 4/22 with seizure activity. CT/MRI brain negative. Pending work up, orthostatic vs vasovagal. PMH includes: anxiety, CDK, HTN, L TKA, B total shoulder arthroplasty.     Clinical Impressions PTA patient independent with ADLs, light IADls and mobility using cane.  Admitted for above and presents near baseline at supervision to modified independence for ADLs and functional mobility.  Utilized RW during session today due to R hip pain and increased stiffness due to decreased mobility since presenting to ED.  Cognition, strength, balance appear to be baseline; orthostatics negative.  Based on performance today, no further OT needs identified and OT will sign off.  Thank you for this referral.      If plan is discharge home, recommend the following:   Assistance with cooking/housework     Functional Status Assessment   Patient has not had a recent decline in their functional status     Equipment Recommendations   None recommended by OT     Recommendations for Other Services         Precautions/Restrictions   Precautions Precautions: Fall Recall of Precautions/Restrictions: Intact Restrictions Weight Bearing Restrictions Per Provider Order: No     Mobility Bed Mobility Overal bed mobility: Modified Independent             General bed mobility comments: no assist required from stretcher in ED    Transfers Overall transfer level: Needs assistance   Transfers: Sit to/from Stand Sit to Stand: Supervision           General transfer comment: pt reaching out for support due to R hip pain, but improved with support on RW      Balance Overall balance assessment: Needs assistance Sitting-balance support: No upper extremity supported, Feet supported Sitting  balance-Leahy Scale: Good     Standing balance support: Bilateral upper extremity supported, During functional activity, Single extremity supported Standing balance-Leahy Scale: Fair Standing balance comment: relies on UE support due to R hip pain                           ADL either performed or assessed with clinical judgement   ADL Overall ADL's : Needs assistance/impaired     Grooming: Supervision/safety;Standing               Lower Body Dressing: Supervision/safety;Sit to/from stand   Toilet Transfer: Supervision/safety;Ambulation;Rolling walker (2 wheels)   Toileting- Clothing Manipulation and Hygiene: Modified independent;Sit to/from stand         General ADL Comments: pt able to transfers, manage toileting needs with only supervision for line mgmt     Vision   Vision Assessment?: No apparent visual deficits     Perception         Praxis         Pertinent Vitals/Pain Pain Assessment Pain Assessment: 0-10 Pain Score: 3  Pain Location: R hip Pain Descriptors / Indicators: Discomfort Pain Intervention(s): Limited activity within patient's tolerance, Monitored during session, Repositioned     Extremity/Trunk Assessment Upper Extremity Assessment Upper Extremity Assessment: Defer to OT evaluation   Lower Extremity Assessment Lower Extremity Assessment: RLE deficits/detail RLE Deficits / Details: chronic R hip pain       Communication Communication Communication: No apparent difficulties   Cognition Arousal: Alert Behavior During Therapy:  WFL for tasks assessed/performed Cognition: No apparent impairments             OT - Cognition Comments: per chart hx of memory deficits but overall appears functional during session                 Following commands: Intact       Cueing  General Comments   Cueing Techniques: Verbal cues  VSS, orthostatics negative.   Exercises     Shoulder Instructions      Home Living  Family/patient expects to be discharged to:: Private residence Living Arrangements: Spouse/significant other Available Help at Discharge: Family Type of Home: House Home Access: Stairs to enter Secretary/administrator of Steps: 4 Entrance Stairs-Rails: Right;Left Home Layout: One level     Bathroom Shower/Tub: Walk-in Pensions consultant: Handicapped height     Home Equipment: Medical laboratory scientific officer - single Acupuncturist (4 wheels)          Prior Functioning/Environment Prior Level of Function : Independent/Modified Independent             Mobility Comments: independent mobility, more than 44ft using cane ADLs Comments: indepedent ADLs, light IADLs    OT Problem List: Pain;Decreased activity tolerance   OT Treatment/Interventions:        OT Goals(Current goals can be found in the care plan section)   Acute Rehab OT Goals Patient Stated Goal: get home OT Goal Formulation: With patient   OT Frequency:       Co-evaluation              AM-PAC OT "6 Clicks" Daily Activity     Outcome Measure Help from another person eating meals?: None Help from another person taking care of personal grooming?: A Little Help from another person toileting, which includes using toliet, bedpan, or urinal?: A Little Help from another person bathing (including washing, rinsing, drying)?: A Little Help from another person to put on and taking off regular upper body clothing?: A Little Help from another person to put on and taking off regular lower body clothing?: A Little 6 Click Score: 19   End of Session Equipment Utilized During Treatment: Rolling walker (2 wheels) Nurse Communication: Mobility status  Activity Tolerance: Patient tolerated treatment well Patient left: in bed;with call bell/phone within reach  OT Visit Diagnosis: Other abnormalities of gait and mobility (R26.89);Pain Pain - Right/Left: Right Pain - part of body: Hip                 Time: 1610-9604 OT Time Calculation (min): 16 min Charges:  OT General Charges $OT Visit: 1 Visit OT Evaluation $OT Eval Low Complexity: 1 Low  Bary Boss, OT Acute Rehabilitation Services Office [phone] Secure Chat Preferred    Corey Adams 07/25/2023, 10:08 AM

## 2023-07-25 NOTE — Care Management CC44 (Signed)
 Condition Code 44 Documentation Completed  Patient Details  Name: Corey Adams. MRN: [id number] Date of Birth: 17-Feb-1946   Condition Code 44 given:  Yes Patient signature on Condition Code 44 notice:  Yes Documentation of 2 MD's agreement:  Yes Code 44 added to claim:  Yes    Joanette Moynahan, RN 07/25/2023, 1:23 PM

## 2023-07-25 NOTE — Progress Notes (Signed)
  Echocardiogram 2D Echocardiogram has been attempted, pt not in room  Fain Home RDCS 07/25/2023, 3:07 PM

## 2023-07-26 ENCOUNTER — Observation Stay (HOSPITAL_BASED_OUTPATIENT_CLINIC_OR_DEPARTMENT_OTHER)

## 2023-07-26 DIAGNOSIS — M5116 Intervertebral disc disorders with radiculopathy, lumbar region: Secondary | ICD-10-CM | POA: Diagnosis not present

## 2023-07-26 DIAGNOSIS — M4316 Spondylolisthesis, lumbar region: Secondary | ICD-10-CM | POA: Diagnosis not present

## 2023-07-26 DIAGNOSIS — R55 Syncope and collapse: Secondary | ICD-10-CM

## 2023-07-26 DIAGNOSIS — M5459 Other low back pain: Secondary | ICD-10-CM | POA: Diagnosis not present

## 2023-07-26 DIAGNOSIS — M48062 Spinal stenosis, lumbar region with neurogenic claudication: Secondary | ICD-10-CM | POA: Diagnosis not present

## 2023-07-26 LAB — BASIC METABOLIC PANEL WITH GFR
Anion gap: 10 (ref 5–15)
BUN: 18 mg/dL (ref 8–23)
CO2: 21 mmol/L — ABNORMAL LOW (ref 22–32)
Calcium: 9 mg/dL (ref 8.9–10.3)
Chloride: 108 mmol/L (ref 98–111)
Creatinine, Ser: 1.25 mg/dL — ABNORMAL HIGH (ref 0.61–1.24)
GFR, Estimated: 60 mL/min — ABNORMAL LOW (ref >60)
Glucose, Bld: 83 mg/dL (ref 70–99)
Potassium: 4.1 mmol/L (ref 3.5–5.1)
Sodium: 139 mmol/L (ref 135–145)

## 2023-07-26 LAB — ECHOCARDIOGRAM COMPLETE
Area-P 1/2: 3.31 cm2
Calc EF: 76.8 %
Height: 68 in
S' Lateral: 2.2 cm
Single Plane A2C EF: 83.8 %
Single Plane A4C EF: 71.3 %
Weight: 2948.87 [oz_av]

## 2023-07-26 LAB — GLUCOSE, CAPILLARY
Glucose-Capillary: 82 mg/dL (ref 70–99)
Glucose-Capillary: 87 mg/dL (ref 70–99)
Glucose-Capillary: 90 mg/dL (ref 70–99)
Glucose-Capillary: 84 mg/dL (ref 70–99)

## 2023-07-26 MED ORDER — METHOCARBAMOL 500 MG PO TABS
500.0000 mg | ORAL_TABLET | Freq: Three times a day (TID) | ORAL | Status: DC
  Administered 2023-07-26 – 2023-07-27 (×4): 500 mg via ORAL
  Filled 2023-07-26 (×4): qty 1

## 2023-07-26 MED ORDER — OXYCODONE HCL 5 MG PO TABS
5.0000 mg | ORAL_TABLET | Freq: Once | ORAL | Status: AC
  Administered 2023-07-26: 5 mg via ORAL
  Filled 2023-07-26: qty 1

## 2023-07-26 MED ORDER — OXYCODONE HCL 5 MG PO TABS
5.0000 mg | ORAL_TABLET | Freq: Four times a day (QID) | ORAL | Status: DC | PRN
  Administered 2023-07-26: 5 mg via ORAL
  Filled 2023-07-26: qty 1

## 2023-07-26 NOTE — Consult Note (Signed)
 Reason for Consult: Back and left leg pain Referring Physician: Dr. Kathrine Paris. is an 78 y.o. male.  HPI: The patient is a 78 year old white male non-smoker who was admitted with a syncopal event.  During his hospitalization the patient has complained of back and right hip/buttock pain into his right leg.  He has previously been seen by Dr. Dalldorf who had previously performed a right hip replacement.  He was worked up with a lumbar MRI on 07/03/2023 which demonstrated a lumbar spondylolisthesis, degenerative changes, spinal stenosis, etc.  The patient was sent for physical therapy in February which did not help.  The patient was referred to Dr. Donna Fus, pain management.  He had a lumbar injection which helped for a day or 2.  During his hospitalization Dr. Dalldorf was consulted and recommended a neurosurgery consult.  Presently the patient is accompanied by his wife.  He tells me "I do not want surgery".  He tells me that surgery was discussed at [facility] orthopedics describing an 8-hour anterior and posterior lumbar surgery/instrumentation.  He complains of right greater left low back pain into his right buttocks and down his right leg.  His symptoms worsened with standing and walking and resolves with sitting.  Past Medical History:  Diagnosis Date   Actinic keratitis    Alcohol abuse    history no drink since 2015   Anxiety    Arthritis    "left thumb" (03/05/2014)   Childhood asthma    Chronic lower back pain    CKD (chronic kidney disease), stage III (HCC)    Complication of anesthesia    difficulty urinating after anesthesia   Depression    Esophageal reflux    GERD (gastroesophageal reflux disease)    Hyperlipidemia    Hypertension    Kidney stones    "plenty; no ORs" (03/05/2014)   Memory change    Migraine    "last one was in the 1990's" (03/05/2014)   Multiple allergies    "sinus stays swollen all the time"   Pre-diabetes    Sleep apnea    "have mask;  don't use it" (03/05/2014)    Past Surgical History:  Procedure Laterality Date   CARPAL TUNNEL RELEASE Bilateral 1990's   COLON SURGERY     DISTAL CLAVICLE EXCISION Right 03/05/2014   EXCISIONAL HEMORRHOIDECTOMY  1983   LAPAROSCOPIC LOW ANTERIOR RESECTION N/A 10/30/2012   Procedure: LAPAROSCOPIC LOW ANTERIOR RESECTION with Rigid Proctoscopy;  Surgeon: Mayme Spearman, MD;  Location: [facility] OR;  Service: General;  Laterality: N/A;   NASAL SINUS SURGERY  1990's   TOTAL KNEE ARTHROPLASTY Left 08/05/2019   Procedure: LEFT TOTAL KNEE ARTHROPLASTY;  Surgeon: Dayne Even, MD;  Location: [facility] ORS;  Service: Orthopedics;  Laterality: Left;   TOTAL SHOULDER ARTHROPLASTY Right 03/05/2014   Procedure: TOTAL SHOULDER ARTHROPLASTY;  Surgeon: Derald Flattery, MD;  Location: [facility] OR;  Service: Orthopedics;  Laterality: Right;  Right shoulder replacement, distal clavicle excision   TOTAL SHOULDER ARTHROPLASTY Left 09/07/2016   TOTAL SHOULDER ARTHROPLASTY Left 09/07/2016   Procedure: TOTAL SHOULDER ARTHROPLASTY;  Surgeon: Sammye Cristal, MD;  Location: [facility] OR;  Service: Orthopedics;  Laterality: Left;  Left total shoulder arthroplasty   VASECTOMY      Family History  Problem Relation Age of Onset   Heart attack Mother    Heart disease Father    Heart attack Father    Hypercholesterolemia Sister     Social History:  reports that he has  never smoked. He has never used smokeless tobacco. He reports current alcohol use. He reports current drug use. Drug: Marijuana.  Allergies:  Allergies  Allergen Reactions   Shellfish Allergy Anaphylaxis   Guar Gum Hives   Xanthan Gum Hives   Codeine Nausea And Vomiting    Medications: I have reviewed the patient's current medications. Prior to Admission:  Medications Prior to Admission  Medication Sig Dispense Refill Last Dose/Taking   Aspirin -Acetaminophen  (GOODYS BACK & BODY PAIN PO) Take 1 Dose by mouth 2 (two) times daily as needed.   07/23/2023    atorvastatin  (LIPITOR) 10 MG tablet Take 10 mg by mouth at bedtime.    07/23/2023   bisoprolol -hydrochlorothiazide  (ZIAC ) 2.5-6.25 MG per tablet Take 1 tablet by mouth every evening.    07/23/2023   CELEBREX 200 MG capsule Take 200 mg by mouth daily as needed.   07/23/2023   Coenzyme Q10 100 MG capsule Take 100 mg by mouth daily.   07/23/2023   diphenhydrAMINE  (BENADRYL ) 25 MG tablet Take 25-50 mg by mouth at bedtime as needed for sleep.   07/23/2023   escitalopram  (LEXAPRO ) 20 MG tablet Take 20 mg by mouth daily.   07/24/2023   omeprazole (PRILOSEC) 40 MG capsule Take 40 mg by mouth daily.    07/24/2023   Scheduled:  acetaminophen   1,000 mg Oral TID   heparin   5,000 Units Subcutaneous Q8H   lidocaine   1 patch Transdermal Q24H   methocarbamol   500 mg Oral TID   sodium chloride  flush  3 mL Intravenous Q12H   Continuous: PRN:ondansetron  **OR** ondansetron  (ZOFRAN ) IV, oxyCODONE  Anti-infectives (From admission, onward)    None        Results for orders placed or performed during the hospital encounter of 07/24/23 (from the past 48 hours)  Troponin I (High Sensitivity)     Status: None   Collection Time: 07/24/23  6:13 PM  Result Value Ref Range   Troponin I (High Sensitivity) 4 <18 ng/L    Comment: (NOTE) Elevated high sensitivity troponin I (hsTnI) values and significant  changes across serial measurements may suggest ACS but many other  chronic and acute conditions are known to elevate hsTnI results.  Refer to the "Links" section for chest pain algorithms and additional  guidance. Performed at [facility], [address] [place], [place] [address]   CBC     Status: None   Collection Time: 07/24/23  9:25 PM  Result Value Ref Range   WBC 8.2 4.0 - 10.5 K/uL   RBC 4.97 4.22 - 5.81 MIL/uL   Hemoglobin 14.5 13.0 - 17.0 g/dL   HCT 60.4 54.0 - 98.1 %   MCV 89.3 80.0 - 100.0 fL   MCH 29.2 26.0 - 34.0 pg   MCHC 32.7 30.0 - 36.0 g/dL   RDW 19.1 47.8 - 29.5 %   Platelets 212  150 - 400 K/uL   nRBC 0.0 0.0 - 0.2 %    Comment: Performed at [facility], [address] [place], [place] [address]  Creatinine, serum     Status: Abnormal   Collection Time: 07/24/23  9:25 PM  Result Value Ref Range   Creatinine, Ser 1.46 (H) 0.61 - 1.24 mg/dL   GFR, Estimated 50 (L) >60 mL/min    Comment: (NOTE) Calculated using the CKD-EPI Creatinine Equation (2021) Performed at [facility], [address] [place], [place] [address]   Magnesium     Status: None   Collection  Time: 07/24/23  9:25 PM  Result Value Ref Range   Magnesium 1.9 1.7 - 2.4 mg/dL    Comment: Performed at [facility], [address], [place], [place] [address]  Phosphorus     Status: None   Collection Time: 07/24/23  9:25 PM  Result Value Ref Range   Phosphorus 3.8 2.5 - 4.6 mg/dL    Comment: Performed at [facility], [address] [place], [place] [address]  TSH     Status: None   Collection Time: 07/24/23  9:25 PM  Result Value Ref Range   TSH 0.514 0.350 - 4.500 uIU/mL    Comment: Performed by a 3rd Generation assay with a functional sensitivity of <=0.01 uIU/mL. Performed at [facility], [address], [place], [place] [address]   Hemoglobin A1c     Status: Abnormal   Collection Time: 07/24/23  9:25 PM  Result Value Ref Range   Hgb A1c MFr Bld 6.0 (H) 4.8 - 5.6 %    Comment: (NOTE) Pre diabetes:          5.7%-6.4%  Diabetes:              >6.4%  Glycemic control for   <7.0% adults with diabetes    Mean Plasma Glucose 125.5 mg/dL    Comment: Performed at [facility], [address], [place], [place] [address]  CBG monitoring, ED     Status: Abnormal   Collection Time: 07/24/23 11:27 PM  Result Value Ref Range   Glucose-Capillary 126 (H) 70 - 99 mg/dL    Comment: Glucose reference range applies only to samples taken after fasting for at least 8 hours.  CBG monitoring, ED     Status: None   Collection Time: 07/25/23  6:03 AM  Result Value Ref  Range   Glucose-Capillary 93 70 - 99 mg/dL    Comment: Glucose reference range applies only to samples taken after fasting for at least 8 hours.  D-dimer, quantitative     Status: None   Collection Time: 07/25/23  6:59 AM  Result Value Ref Range   D-Dimer, Quant 0.48 0.00 - 0.50 ug/mL-FEU    Comment: (NOTE) At the manufacturer cut-off value of 0.5 g/mL FEU, this assay has a negative predictive value of 95-100%.This assay is intended for use in conjunction with a clinical pretest probability (PTP) assessment model to exclude pulmonary embolism (PE) and deep venous thrombosis (DVT) in outpatients suspected of PE or DVT. Results should be correlated with clinical presentation. Performed at [facility], [address], [place], [place] [address]   CBG monitoring, ED     Status: None   Collection Time: 07/25/23  7:55 AM  Result Value Ref Range   Glucose-Capillary 91 70 - 99 mg/dL    Comment: Glucose reference range applies only to samples taken after fasting for at least 8 hours.  Comprehensive metabolic panel with GFR     Status: Abnormal   Collection Time: 07/25/23  3:29 PM  Result Value Ref Range   Sodium 137 135 - 145 mmol/L   Potassium 3.8 3.5 - 5.1 mmol/L   Chloride 107 98 - 111 mmol/L   CO2 20 (L) 22 - 32 mmol/L   Glucose, Bld 93 70 - 99 mg/dL    Comment: Glucose reference range applies only to samples taken after fasting for at least 8 hours.   BUN 23 8 - 23 mg/dL   Creatinine, Ser 6.57 (  H) 0.61 - 1.24 mg/dL   Calcium  9.1 8.9 - 10.3 mg/dL   Total Protein 6.1 (L) 6.5 - 8.1 g/dL   Albumin  3.3 (L) 3.5 - 5.0 g/dL   AST 15 15 - 41 U/L   ALT 16 0 - 44 U/L   Alkaline Phosphatase 44 38 - 126 U/L   Total Bilirubin 0.6 0.0 - 1.2 mg/dL   GFR, Estimated 51 (L) >60 mL/min    Comment: (NOTE) Calculated using the CKD-EPI Creatinine Equation (2021)    Anion gap 10 5 - 15    Comment: Performed at [facility], [address], [place], [place] [address]  Glucose,  capillary     Status: None   Collection Time: 07/25/23  3:57 PM  Result Value Ref Range   Glucose-Capillary 99 70 - 99 mg/dL    Comment: Glucose reference range applies only to samples taken after fasting for at least 8 hours.  Glucose, capillary     Status: None   Collection Time: 07/25/23  9:28 PM  Result Value Ref Range   Glucose-Capillary 91 70 - 99 mg/dL    Comment: Glucose reference range applies only to samples taken after fasting for at least 8 hours.  Glucose, capillary     Status: None   Collection Time: 07/26/23  6:25 AM  Result Value Ref Range   Glucose-Capillary 82 70 - 99 mg/dL    Comment: Glucose reference range applies only to samples taken after fasting for at least 8 hours.  Basic metabolic panel     Status: Abnormal   Collection Time: 07/26/23  7:34 AM  Result Value Ref Range   Sodium 139 135 - 145 mmol/L   Potassium 4.1 3.5 - 5.1 mmol/L   Chloride 108 98 - 111 mmol/L   CO2 21 (L) 22 - 32 mmol/L   Glucose, Bld 83 70 - 99 mg/dL    Comment: Glucose reference range applies only to samples taken after fasting for at least 8 hours.   BUN 18 8 - 23 mg/dL   Creatinine, Ser 0.27 (H) 0.61 - 1.24 mg/dL   Calcium  9.0 8.9 - 10.3 mg/dL   GFR, Estimated 60 (L) >60 mL/min    Comment: (NOTE) Calculated using the CKD-EPI Creatinine Equation (2021)    Anion gap 10 5 - 15    Comment: Performed at [facility], [address] [place], [place] [address]  Glucose, capillary     Status: None   Collection Time: 07/26/23 11:13 AM  Result Value Ref Range   Glucose-Capillary 87 70 - 99 mg/dL    Comment: Glucose reference range applies only to samples taken after fasting for at least 8 hours.  Glucose, capillary     Status: None   Collection Time: 07/26/23  3:42 PM  Result Value Ref Range   Glucose-Capillary 90 70 - 99 mg/dL    Comment: Glucose reference range applies only to samples taken after fasting for at least 8 hours.    ECHOCARDIOGRAM COMPLETE Result Date:  07/26/2023    ECHOCARDIOGRAM REPORT   Patient Name:   Corey Adams. Date of Exam: 07/26/2023 Medical Rec #:  [id number]            Height:       68.0 in Accession #:    [id number]           Weight:       184.3 lb Date of Birth:  Jan 08, 1946  BSA:          1.974 m Patient Age:    78 years             BP:           116/68 mmHg Patient Gender: M                    HR:           82 bpm. Exam Location:  Inpatient Procedure: 2D Echo, Cardiac Doppler and Color Doppler (Both Spectral and Color            Flow Doppler were utilized during procedure). Indications:    R55 Syncope  History:        Patient has no prior history of Echocardiogram examinations.                 Signs/Symptoms:Altered Mental Status; Risk Factors:Hypertension,                 Dyslipidemia and Sleep Apnea. ETOH.  Sonographer:    Raynelle Callow RDCS Referring Phys: [id number] ANAND D HONGALGI IMPRESSIONS  1. Left ventricular ejection fraction, by estimation, is 70 to 75%. The left ventricle has hyperdynamic function. The left ventricle has no regional wall motion abnormalities. There is mild left ventricular hypertrophy of the basal-septal segment. Left ventricular diastolic parameters are indeterminate.  2. Right ventricular systolic function is normal. The right ventricular size is normal. There is mildly elevated pulmonary artery systolic pressure.  3. The mitral valve is normal in structure. No evidence of mitral valve regurgitation. No evidence of mitral stenosis.  4. The aortic valve is normal in structure. Aortic valve regurgitation is not visualized. No aortic stenosis is present.  5. The inferior vena cava is normal in size with greater than 50% respiratory variability, suggesting right atrial pressure of 3 mmHg. FINDINGS  Left Ventricle: Hyperdynamic LV without significant intracavitary gradient. Left ventricular ejection fraction, by estimation, is 70 to 75%. The left ventricle has hyperdynamic function. The left ventricle has no regional  wall motion abnormalities. The left ventricular internal cavity size was normal in size. There is mild left ventricular hypertrophy of the basal-septal segment. Left ventricular diastolic parameters are indeterminate. Right Ventricle: The right ventricular size is normal. No increase in right ventricular wall thickness. Right ventricular systolic function is normal. There is mildly elevated pulmonary artery systolic pressure. The tricuspid regurgitant velocity is 2.76  m/s, and with an assumed right atrial pressure of 8 mmHg, the estimated right ventricular systolic pressure is 38.5 mmHg. Left Atrium: Left atrial size was normal in size. Right Atrium: Right atrial size was normal in size. Pericardium: There is no evidence of pericardial effusion. Mitral Valve: The mitral valve is normal in structure. No evidence of mitral valve regurgitation. No evidence of mitral valve stenosis. Tricuspid Valve: The tricuspid valve is normal in structure. Tricuspid valve regurgitation is not demonstrated. No evidence of tricuspid stenosis. Aortic Valve: The aortic valve is normal in structure. Aortic valve regurgitation is not visualized. No aortic stenosis is present. Pulmonic Valve: The pulmonic valve was normal in structure. Pulmonic valve regurgitation is not visualized. No evidence of pulmonic stenosis. Aorta: The aortic root is normal in size and structure. Venous: The inferior vena cava is normal in size with greater than 50% respiratory variability, suggesting right atrial pressure of 3 mmHg. IAS/Shunts: No atrial level shunt detected by color flow Doppler.  LEFT VENTRICLE PLAX 2D LVIDd:  3.60 cm     Diastology LVIDs:         2.20 cm     LV e' medial:    5.44 cm/s LV PW:         1.20 cm     LV E/e' medial:  14.1 LV IVS:        1.00 cm     LV e' lateral:   8.70 cm/s LVOT diam:     2.40 cm     LV E/e' lateral: 8.8 LV SV:         98 LV SV Index:   50 LVOT Area:     4.52 cm  LV Volumes (MOD) LV vol d, MOD A2C: 56.7 ml  LV vol d, MOD A4C: 50.2 ml LV vol s, MOD A2C: 9.2 ml LV vol s, MOD A4C: 14.4 ml LV SV MOD A2C:     47.5 ml LV SV MOD A4C:     50.2 ml LV SV MOD BP:      40.8 ml RIGHT VENTRICLE             IVC RV S prime:     13.10 cm/s  IVC diam: 1.70 cm TAPSE (M-mode): 2.0 cm LEFT ATRIUM             Index        RIGHT ATRIUM          Index LA diam:        3.70 cm 1.87 cm/m   RA Area:     4.60 cm LA Vol (A2C):   21.9 ml 11.09 ml/m  RA Volume:   5.10 ml  2.58 ml/m LA Vol (A4C):   18.6 ml 9.42 ml/m LA Biplane Vol: 20.7 ml 10.49 ml/m  AORTIC VALVE LVOT Vmax:   122.00 cm/s LVOT Vmean:  84.300 cm/s LVOT VTI:    0.217 m  AORTA Ao Root diam: 3.50 cm Ao Asc diam:  3.30 cm MITRAL VALVE               TRICUSPID VALVE MV Area (PHT): 3.31 cm    TR Peak grad:   30.5 mmHg MV Decel Time: 229 msec    TR Vmax:        276.00 cm/s MV E velocity: 76.80 cm/s MV A velocity: 97.10 cm/s  SHUNTS MV E/A ratio:  0.79        Systemic VTI:  0.22 m                            Systemic Diam: 2.40 cm Arta Lark Electronically signed by Arta Lark Signature Date/Time: 07/26/2023/1:54:06 PM    Final    EEG adult Result Date: 07/25/2023 Arleene Lack, MD     07/25/2023  2:24 PM Patient Name: Corey Adams. MRN: [id number] Epilepsy Attending: Arleene Lack Referring Physician/Provider: Casey Clay, MD Date: 07/25/2023 Duration: 23.48 mins Patient history: 78yo M with syncope. EEG to evaluate for seizure Level of alertness: Awake AEDs during EEG study: None Technical aspects: This EEG study was done with scalp electrodes positioned according to the 10-20 International system of electrode placement. Electrical activity was reviewed with band pass filter of 1-70Hz , sensitivity of 7 uV/mm, display speed of 29mm/sec with a 60Hz  notched filter applied as appropriate. EEG data were recorded continuously and digitally stored.  Video monitoring was available and reviewed as appropriate. Description: The posterior dominant rhythm consists of  8-9  Hz activity of moderate voltage (25-35 uV) seen predominantly in posterior head regions, symmetric and reactive to eye opening and eye closing. Physiologic photic driving was not seen during photic stimulation.  Hyperventilation was not performed.   IMPRESSION: This study is within normal limits. No seizures or epileptiform discharges were seen throughout the recording. A normal interictal EEG does not exclude the diagnosis of epilepsy. Arleene Lack   MR BRAIN WO CONTRAST Result Date: 07/24/2023 CLINICAL DATA:  Syncope EXAM: MRI HEAD WITHOUT CONTRAST TECHNIQUE: Multiplanar, multiecho pulse sequences of the brain and surrounding structures were obtained without intravenous contrast. COMPARISON:  09/06/2020 FINDINGS: Brain: No acute infarct, mass effect or extra-axial collection. No acute or chronic hemorrhage. Normal white matter signal, parenchymal volume and CSF spaces. The midline structures are normal. Vascular: Normal flow voids. Skull and upper cervical spine: Normal calvarium and skull base. Visualized upper cervical spine and soft tissues are normal. Sinuses/Orbits:No paranasal sinus fluid levels or advanced mucosal thickening. No mastoid or middle ear effusion. Normal orbits. IMPRESSION: Normal brain MRI. Electronically Signed   By: Juanetta Nordmann M.D.   On: 07/24/2023 23:38   CT Head Wo Contrast Result Date: 07/24/2023 CLINICAL DATA:  Mental status change EXAM: CT HEAD WITHOUT CONTRAST TECHNIQUE: Contiguous axial images were obtained from the base of the skull through the vertex without intravenous contrast. RADIATION DOSE REDUCTION: This exam was performed according to the departmental dose-optimization program which includes automated exposure control, adjustment of the mA and/or kV according to patient size and/or use of iterative reconstruction technique. COMPARISON:  MRI brain 09/06/2020. FINDINGS: Brain: No evidence of acute infarction, hemorrhage, hydrocephalus, extra-axial collection or  mass lesion/mass effect. Vascular: No hyperdense vessel or unexpected calcification. Skull: Normal. Negative for fracture or focal lesion. Sinuses/Orbits: No acute finding. Other: None. IMPRESSION: No acute intracranial abnormality. Electronically Signed   By: Tyron Gallon M.D.   On: 07/24/2023 17:58    ROS: As above Blood pressure 112/62, pulse 71, temperature 98.1 F (36.7 C), temperature source Oral, resp. rate 20, height 5\' 8"  (1.727 m), weight 83.6 kg, SpO2 98%. Estimated body mass index is 28.02 kg/m as calculated from the following:   Height as of this encounter: 5\' 8"  (1.727 m).   Weight as of this encounter: 83.6 kg.  Physical Exam  General: An alert and pleasant 78 year old white male who complains of right low back right buttock and leg pain.  HEENT: Normocephalic, atraumatic, extraocular muscles are intact  Neck: Limited range of motion, Spurling's testing is negative.  Thorax: Symmetric  Abdomen: Soft  Extremities: Unremarkable  Back exam: Faber's testing and straight leg raise testing are negative bilaterally.  Neurologic exam: The patient is alert and oriented x 3.  Cranial nerves II through XII are grossly intact bilaterally.  The patient's motor strength is 5/5 in his bilateral bicep, tricep, handgrip, deltoid, quadricep, gastrocnemius and dorsiflexors.  Sensory function is intact to light touch sensation all tested dermatomes bilaterally.  Cerebellar function is intact to rapid alternating movements of the upper extremities bilaterally.  Imaging studies: I have reviewed the patient's lumbar MRI performed 07/03/2023.  He has diffuse degenerative changes, spondylosis, facet arthropathy, etc.  He has a grade 1 spondylolisthesis at L4-5.  There is mild retrolisthesis at T12-L1, L1-2 and L2-3  On the axial images T12-L1 and L1 to have relatively mild multifactorial spinal stenosis.  L2-3 and L4-5 severe multifactorial spinal stenosis.  L3-4 has more moderate to severe  multifactorial spinal stenosis L5-S1 is relatively unremarkable.  Assessment/Plan:  Lumbar spondylolisthesis/retrolisthesis, lumbar spinal stenosis, neurogenic claudication, lumbar radiculopathy, lumbago: I have discussed the situation with the patient and his wife.  I have explained he is likely suffering from neurogenic claudication from the stenosis/pinched nerves.  We discussed the various treatment options including "living with it", more therapy, more injections, continue medical management, and surgery.  We briefly discussed a lumbar decompression, instrumentation and fusion.  As above, presently he has no interest in surgery.  I have recommended that they follow-up with me in the office so we can review the images studies and further discuss the situation.  I have answered all their questions.  Please have him follow-up with me in the office a couple weeks after discharge.  Please call if I can be of further assistance.  Corey Adams 07/26/2023, 5:10 PM

## 2023-07-26 NOTE — TOC CM/SW Note (Signed)
 Transition of Care [facility]) - Inpatient Brief Assessment   Patient Details  Name: Corey Adams. MRN: [id number] Date of Birth: November 18, 1945  Transition of Care [facility]) CM/SW Contact:    Tom-Johnson, Angelique Ken, RN Phone Number: 07/26/2023, 12:52 PM   Clinical Narrative:  Patient to the Ed after a Syncopal episode at home. Patient also c/o Rt Hip pain/sciatica, Orthopedics consulted. CT Head without acute abnormalities. MRI negative. HS Troponin x2 negative. 2D Echo pending. Neurology consulted, EEG WNL. Patient states he was advised not to drive for 6 mths or until cleared by his physicians. Patient states he does not hx of Seizures. Has hx of CKD IIIa, Depression, ETOH, HTN, HLD, OSA, Pre-Diabetes, GERD and chronic Back pain.  From home with wife, [name], has one supportive son who lives out of town. Independent with care prior to admit. Has all necessary DME's at home.  PCP is Sun, Vyvyan, MD and uses [facility] on [address] in [place] and also [facility].   No PT/OT f/u noted.  Patient not Medically ready for discharge.  CM will continue to follow as patient progresses with care towards discharge.               Transition of Care Asessment: Insurance and Status: Insurance coverage has been reviewed Patient has primary care physician: Yes Home environment has been reviewed: home with spouse Prior level of function:: independent Prior/Current Home Services: No current home services   Readmission risk has been reviewed: Yes Transition of care needs: transition of care needs identified, TOC will continue to follow

## 2023-07-26 NOTE — Progress Notes (Addendum)
 PROGRESS NOTE   Corey Adams.  FAO:[id number]    DOB: 1945/10/13    DOA: 07/24/2023  PCP: Sun, Vyvyan, MD   I have briefly reviewed patients previous medical records in [facility].   Brief Hospital Course:  78 y.o. married male, independent, with medical history significant for HTN, HLD, CKDIIIa, Pre-dabetes, HLD, GERD, alcohol use disorder now in remission, depression, OSA, chronic back pain/spinal stenosis with recent flare for which he received spinal steroid injections as well as NSAIDs and muscle relaxants, presented to the ED, brought in by EMS after a syncopal episode at home while sitting on his recliner.  EMS noted SBP of 90.  The syncopal episode was associated with involuntary generalized shaking without bowel or bladder incontinence.  He was nauseous prior to the episode.  Admitted for evaluation of syncope.  2D echo pending.  Hospital course complicated by severe, right hip pain/sciatica.  Orthopedics consulted.  Assessment & Plan:   Syncope History as noted above.  Reportedly occurred when he was laying almost "flat" on his recliner.  Apart from mild nausea, did not have any premonitory symptoms.  No prior history of syncope DD: Hypotension/orthostatic hypotension, vasovagal, low index of suspicion for seizures MRI brain normal.  CT head without acute abnormalities. HS Troponin x 2 negative.  TSH normal.  A1c 6.  Telemetry shows sinus rhythm currently.  D-dimer negative.   Semiorthostatics yesterday on lying to sitting were negative. 2D echo: LVEF 70-75%.  No aortic stenosis. EEG: WNL. Patient has been counseled that he should not drive x 6 months or until cleared by his physicians during outpatient office visit.  He verbalized understanding.  Stage IIIa chronic kidney disease Last known creatinine in April 2021: 1.45.  Presented with creatinine of 1.67.  Post IV hydration, creatinine has improved to 1.2.  Outpatient follow-up. Monitor  HTN  Antihypertensives  were held initially due to hypotension, concern for orthostatic hypotension.  Blood pressures continue to be well-controlled off of meds.  Continue to monitor.   HLD Continue atorvastatin     Pre-diabetes Hemoglobin A1c 6.   GERD continue ppi    OSA -non complaint with cpap  - [place] at bedtime    Subacute on chronic back pain/intractable pain Primary orthopedics is Dr. Dalldorf only saw couple of weeks ago, received what appears to be a steroid shot topically.  This did not help.  He was then referred and underwent another shot Tuesday of last week with relief for 3 to 4 days but since then ongoing 24/7 pain, severe pain with ambulation. Tylenol , Celebrex at home minimally helped.  Tramadol  here helped only a little.  Good relief with oxycodone .  Advised him regarding the narcotics, extensive side effects, risks and benefits.  Continue multimodality pain control (scheduled Tylenol , add scheduled Robaxin , continue topical lidocaine  which is helping some, temporary Oxy IR for pain). Requested orthopedic consultation.  I communicated with Ortho APP, Steffanie Edouard this morning who discussed in detail with patient's primary orthopedic doctor Dr. Dalldorf who then recommended a neurosurgery consultation.  Patient reportedly had MRI of L-spine recently at [facility].  These imaging were able to transmit for neurosurgery viewing.  I then discussed with Dr. Larrie Po.  Weight loss Spouse reported that patient has lost about 20 pounds since December of last year, unintentionally. No clear etiology.  Recommended close outpatient follow-up with PCP for further evaluation.  Body mass index is 28.02 kg/m.   DVT prophylaxis: heparin  injection 5,000 Units Start: 07/24/23 2200  Code Status: Full Code:  Family Communication: Discussed in detail with patient's spouse via phone. Disposition:  Currently without inpatient intensity.  Most of his medications have been p.o.  DC home in a.m.  pending adequate pain control.  Patient declines any surgical interventions at this time and can follow-up with neurosurgery as outpatient.     Consultants:   Orthopedics  Procedures:     Subjective:  States that he has been ambulating to the bathroom with a walker with significant right hip pain as noted above.  Rest of history as noted above.  No dizziness, lightheadedness, chest pain or dyspnea.  Objective:   Vitals:   08/09/2023 2002 2023-08-09 2357 07/26/23 0420 07/26/23 0714  BP: 119/77 121/73 121/73 125/83  Pulse: 80 76 84 75  Resp: 18 18 18 12   Temp: 98.7 F (37.1 C) 98.5 F (36.9 C) 98 F (36.7 C) 98 F (36.7 C)  TempSrc: Oral Oral Oral Oral  SpO2: 97% 98% 97% 99%  Weight:   83.6 kg   Height:        General exam: Elderly male, moderately built and nourished lying comfortably propped up in bed without distress.  Oral mucosa moist Respiratory system: Clear to auscultation. Respiratory effort normal. Cardiovascular system: S1 & S2 heard, RRR. No JVD, murmurs, rubs, gallops or clicks. No pedal edema.  Telemetry personally reviewed: Sinus rhythm. Gastrointestinal system: Abdomen is nondistended, soft and nontender. No organomegaly or masses felt. Normal bowel sounds heard. Central nervous system: Alert and oriented. No focal neurological deficits. Extremities: Symmetric 5 x 5 power. Skin: No rashes, lesions or ulcers Psychiatry: Judgement and insight appear normal. Mood & affect appropriate.  Musculoskeletal: Area of almost point tenderness in the area of the right lower lumbar spine/hip area.    Data Reviewed:   I have personally reviewed following labs and imaging studies   CBC: Recent Labs  Lab 07/24/23 1618 07/24/23 2125  WBC 10.0 8.2  NEUTROABS 7.5  --   HGB 14.1 14.5  HCT 42.9 44.4  MCV 89.7 89.3  PLT 213 212    Basic Metabolic Panel: Recent Labs  Lab 07/24/23 1618 07/24/23 2125 08-09-23 1529 07/26/23 0734  NA 137  --  137 139  K 4.0  --  3.8  4.1  CL 104  --  107 108  CO2 20*  --  20* 21*  GLUCOSE 168*  --  93 83  BUN 27*  --  23 18  CREATININE 1.67* 1.46* 1.42* 1.25*  CALCIUM  9.0  --  9.1 9.0  MG  --  1.9  --   --   PHOS  --  3.8  --   --     Liver Function Tests: Recent Labs  Lab 07/24/23 1618 08/09/23 1529  AST 15 15  ALT 18 16  ALKPHOS 49 44  BILITOT 0.5 0.6  PROT 6.2* 6.1*  ALBUMIN  3.5 3.3*    CBG: Recent Labs  Lab Aug 09, 2023 1557 08-09-2023 2128 07/26/23 0625  GLUCAP 99 91 82    Microbiology Studies:  No results found for this or any previous visit (from the past 240 hours).  Radiology Studies:  EEG adult Result Date: 08-09-23 Arleene Lack, MD     08-09-2023  2:24 PM Patient Name: Corey Adams. MRN: [id number] Epilepsy Attending: Arleene Lack Referring Physician/Provider: Casey Clay, MD Date: Aug 09, 2023 Duration: 23.48 mins Patient history: 78yo M with syncope. EEG to evaluate for seizure Level of alertness: Awake AEDs during EEG  study: None Technical aspects: This EEG study was done with scalp electrodes positioned according to the 10-20 International system of electrode placement. Electrical activity was reviewed with band pass filter of 1-70Hz , sensitivity of 7 uV/mm, display speed of 108mm/sec with a 60Hz  notched filter applied as appropriate. EEG data were recorded continuously and digitally stored.  Video monitoring was available and reviewed as appropriate. Description: The posterior dominant rhythm consists of 8-9 Hz activity of moderate voltage (25-35 uV) seen predominantly in posterior head regions, symmetric and reactive to eye opening and eye closing. Physiologic photic driving was not seen during photic stimulation.  Hyperventilation was not performed.   IMPRESSION: This study is within normal limits. No seizures or epileptiform discharges were seen throughout the recording. A normal interictal EEG does not exclude the diagnosis of epilepsy. Arleene Lack   MR BRAIN WO  CONTRAST Result Date: 07/24/2023 CLINICAL DATA:  Syncope EXAM: MRI HEAD WITHOUT CONTRAST TECHNIQUE: Multiplanar, multiecho pulse sequences of the brain and surrounding structures were obtained without intravenous contrast. COMPARISON:  09/06/2020 FINDINGS: Brain: No acute infarct, mass effect or extra-axial collection. No acute or chronic hemorrhage. Normal white matter signal, parenchymal volume and CSF spaces. The midline structures are normal. Vascular: Normal flow voids. Skull and upper cervical spine: Normal calvarium and skull base. Visualized upper cervical spine and soft tissues are normal. Sinuses/Orbits:No paranasal sinus fluid levels or advanced mucosal thickening. No mastoid or middle ear effusion. Normal orbits. IMPRESSION: Normal brain MRI. Electronically Signed   By: Juanetta Nordmann M.D.   On: 07/24/2023 23:38   CT Head Wo Contrast Result Date: 07/24/2023 CLINICAL DATA:  Mental status change EXAM: CT HEAD WITHOUT CONTRAST TECHNIQUE: Contiguous axial images were obtained from the base of the skull through the vertex without intravenous contrast. RADIATION DOSE REDUCTION: This exam was performed according to the departmental dose-optimization program which includes automated exposure control, adjustment of the mA and/or kV according to patient size and/or use of iterative reconstruction technique. COMPARISON:  MRI brain 09/06/2020. FINDINGS: Brain: No evidence of acute infarction, hemorrhage, hydrocephalus, extra-axial collection or mass lesion/mass effect. Vascular: No hyperdense vessel or unexpected calcification. Skull: Normal. Negative for fracture or focal lesion. Sinuses/Orbits: No acute finding. Other: None. IMPRESSION: No acute intracranial abnormality. Electronically Signed   By: Tyron Gallon M.D.   On: 07/24/2023 17:58   DG Chest Port 1 View Result Date: 07/24/2023 CLINICAL DATA:  Seizure, syncope. EXAM: PORTABLE CHEST 1 VIEW COMPARISON:  July 24, 2019. FINDINGS: The heart size and  mediastinal contours are within normal limits. Both lungs are clear. Status post bilateral shoulder arthroplasty. IMPRESSION: No active disease. Electronically Signed   By: Rosalene Colon M.D.   On: 07/24/2023 16:52    Scheduled Meds:    acetaminophen   1,000 mg Oral TID   heparin   5,000 Units Subcutaneous Q8H   lidocaine   1 patch Transdermal Q24H   sodium chloride  flush  3 mL Intravenous Q12H    Continuous Infusions:       LOS: 1 day     Aubrey Blas, MD,  FACP, [facility], [facility], [facility]   [facility] [place]      To contact the attending provider between 7A-7P or the covering provider during after hours 7P-7A, please log into the web site [email or web address] and access using universal [place] password for that web site. If you do not have the password, please call the hospital operator.  07/26/2023, 8:48 AM

## 2023-07-26 NOTE — Progress Notes (Signed)
  Echocardiogram 2D Echocardiogram has been performed.  Royden Corin 07/26/2023, 1:52 PM

## 2023-07-27 DIAGNOSIS — M5116 Intervertebral disc disorders with radiculopathy, lumbar region: Secondary | ICD-10-CM | POA: Diagnosis not present

## 2023-07-27 DIAGNOSIS — M4316 Spondylolisthesis, lumbar region: Secondary | ICD-10-CM | POA: Diagnosis not present

## 2023-07-27 DIAGNOSIS — R55 Syncope and collapse: Secondary | ICD-10-CM | POA: Diagnosis not present

## 2023-07-27 DIAGNOSIS — M48062 Spinal stenosis, lumbar region with neurogenic claudication: Secondary | ICD-10-CM | POA: Diagnosis not present

## 2023-07-27 LAB — GLUCOSE, CAPILLARY
Glucose-Capillary: 87 mg/dL (ref 70–99)
Glucose-Capillary: 93 mg/dL (ref 70–99)

## 2023-07-27 MED ORDER — METHOCARBAMOL 500 MG PO TABS: 500.0000 mg | ORAL_TABLET | Freq: Three times a day (TID) | ORAL | 0 refills | Status: AC

## 2023-07-27 MED ORDER — HYDROCODONE-ACETAMINOPHEN 7.5-325 MG/15ML PO SOLN: 15.0000 mL | Freq: Four times a day (QID) | ORAL | 0 refills | Status: DC | PRN

## 2023-07-27 MED ORDER — LIDOCAINE 5 % EX PTCH: 1.0000 | MEDICATED_PATCH | CUTANEOUS | 0 refills | Status: DC

## 2023-07-27 NOTE — Plan of Care (Signed)
  Problem: Education: Goal: Knowledge of condition and prescribed therapy will improve Outcome: Progressing   Problem: Education: Goal: Knowledge of General Education information will improve Description: Including pain rating scale, medication(s)/side effects and non-pharmacologic comfort measures Outcome: Progressing   Problem: Clinical Measurements: Goal: Will remain free from infection Outcome: Progressing

## 2023-07-27 NOTE — Progress Notes (Signed)
 Subjective: The patient is alert and pleasant.  He is sleeping in the recliner.  Objective: Vital signs in last 24 hours: Temp:  [98 F (36.7 C)-98.2 F (36.8 C)] 98.1 F (36.7 C) (04/25 0441) Pulse Rate:  [71-81] 81 (04/25 0441) Resp:  [12-20] 20 (04/25 0441) BP: (112-125)/(60-83) 117/60 (04/25 0441) SpO2:  [98 %-100 %] 99 % (04/25 0441) Weight:  [83.5 kg] 83.5 kg (04/25 0441) Estimated body mass index is 27.98 kg/m as calculated from the following:   Height as of this encounter: 5\' 8"  (1.727 m).   Weight as of this encounter: 83.5 kg.   Intake/Output from previous day: 04/24 0701 - 04/25 0700 In: 457 [P.O.:457] Out: -  Intake/Output this shift: Total I/O In: 100 [P.O.:100] Out: -   Physical exam the patient is alert and pleasant.  His lower extremity strength is normal.  Lab Results: Recent Labs    07/24/23 1618 07/24/23 2125  WBC 10.0 8.2  HGB 14.1 14.5  HCT 42.9 44.4  PLT 213 212   BMET Recent Labs    07/25/23 1529 07/26/23 0734  NA 137 139  K 3.8 4.1  CL 107 108  CO2 20* 21*  GLUCOSE 93 83  BUN 23 18  CREATININE 1.42* 1.25*  CALCIUM  9.1 9.0    Studies/Results: ECHOCARDIOGRAM COMPLETE Result Date: 07/26/2023    ECHOCARDIOGRAM REPORT   Patient Name:   Mathayus Stanbery. Date of Exam: 07/26/2023 Medical Rec #:  [id number]            Height:       68.0 in Accession #:    [id number]           Weight:       184.3 lb Date of Birth:  03-28-1946             BSA:          1.974 m Patient Age:    78 years             BP:           116/68 mmHg Patient Gender: M                    HR:           82 bpm. Exam Location:  Inpatient Procedure: 2D Echo, Cardiac Doppler and Color Doppler (Both Spectral and Color            Flow Doppler were utilized during procedure). Indications:    R55 Syncope  History:        Patient has no prior history of Echocardiogram examinations.                 Signs/Symptoms:Altered Mental Status; Risk Factors:Hypertension,                  Dyslipidemia and Sleep Apnea. ETOH.  Sonographer:    Raynelle Callow RDCS Referring Phys: [phone] ANAND D HONGALGI IMPRESSIONS  1. Left ventricular ejection fraction, by estimation, is 70 to 75%. The left ventricle has hyperdynamic function. The left ventricle has no regional wall motion abnormalities. There is mild left ventricular hypertrophy of the basal-septal segment. Left ventricular diastolic parameters are indeterminate.  2. Right ventricular systolic function is normal. The right ventricular size is normal. There is mildly elevated pulmonary artery systolic pressure.  3. The mitral valve is normal in structure. No evidence of mitral valve regurgitation. No evidence of mitral stenosis.  4. The aortic valve is normal  in structure. Aortic valve regurgitation is not visualized. No aortic stenosis is present.  5. The inferior vena cava is normal in size with greater than 50% respiratory variability, suggesting right atrial pressure of 3 mmHg. FINDINGS  Left Ventricle: Hyperdynamic LV without significant intracavitary gradient. Left ventricular ejection fraction, by estimation, is 70 to 75%. The left ventricle has hyperdynamic function. The left ventricle has no regional wall motion abnormalities. The left ventricular internal cavity size was normal in size. There is mild left ventricular hypertrophy of the basal-septal segment. Left ventricular diastolic parameters are indeterminate. Right Ventricle: The right ventricular size is normal. No increase in right ventricular wall thickness. Right ventricular systolic function is normal. There is mildly elevated pulmonary artery systolic pressure. The tricuspid regurgitant velocity is 2.76  m/s, and with an assumed right atrial pressure of 8 mmHg, the estimated right ventricular systolic pressure is 38.5 mmHg. Left Atrium: Left atrial size was normal in size. Right Atrium: Right atrial size was normal in size. Pericardium: There is no evidence of pericardial effusion. Mitral  Valve: The mitral valve is normal in structure. No evidence of mitral valve regurgitation. No evidence of mitral valve stenosis. Tricuspid Valve: The tricuspid valve is normal in structure. Tricuspid valve regurgitation is not demonstrated. No evidence of tricuspid stenosis. Aortic Valve: The aortic valve is normal in structure. Aortic valve regurgitation is not visualized. No aortic stenosis is present. Pulmonic Valve: The pulmonic valve was normal in structure. Pulmonic valve regurgitation is not visualized. No evidence of pulmonic stenosis. Aorta: The aortic root is normal in size and structure. Venous: The inferior vena cava is normal in size with greater than 50% respiratory variability, suggesting right atrial pressure of 3 mmHg. IAS/Shunts: No atrial level shunt detected by color flow Doppler.  LEFT VENTRICLE PLAX 2D LVIDd:         3.60 cm     Diastology LVIDs:         2.20 cm     LV e' medial:    5.44 cm/s LV PW:         1.20 cm     LV E/e' medial:  14.1 LV IVS:        1.00 cm     LV e' lateral:   8.70 cm/s LVOT diam:     2.40 cm     LV E/e' lateral: 8.8 LV SV:         98 LV SV Index:   50 LVOT Area:     4.52 cm  LV Volumes (MOD) LV vol d, MOD A2C: 56.7 ml LV vol d, MOD A4C: 50.2 ml LV vol s, MOD A2C: 9.2 ml LV vol s, MOD A4C: 14.4 ml LV SV MOD A2C:     47.5 ml LV SV MOD A4C:     50.2 ml LV SV MOD BP:      40.8 ml RIGHT VENTRICLE             IVC RV S prime:     13.10 cm/s  IVC diam: 1.70 cm TAPSE (M-mode): 2.0 cm LEFT ATRIUM             Index        RIGHT ATRIUM          Index LA diam:        3.70 cm 1.87 cm/m   RA Area:     4.60 cm LA Vol (A2C):   21.9 ml 11.09 ml/m  RA Volume:   5.10 ml  2.58 ml/m LA Vol (A4C):   18.6 ml 9.42 ml/m LA Biplane Vol: 20.7 ml 10.49 ml/m  AORTIC VALVE LVOT Vmax:   122.00 cm/s LVOT Vmean:  84.300 cm/s LVOT VTI:    0.217 m  AORTA Ao Root diam: 3.50 cm Ao Asc diam:  3.30 cm MITRAL VALVE               TRICUSPID VALVE MV Area (PHT): 3.31 cm    TR Peak grad:   30.5 mmHg MV  Decel Time: 229 msec    TR Vmax:        276.00 cm/s MV E velocity: 76.80 cm/s MV A velocity: 97.10 cm/s  SHUNTS MV E/A ratio:  0.79        Systemic VTI:  0.22 m                            Systemic Diam: 2.40 cm Arta Lark Electronically signed by Arta Lark Signature Date/Time: 07/26/2023/1:54:06 PM    Final    EEG adult Result Date: 07/25/2023 Arleene Lack, MD     07/25/2023  2:24 PM Patient Name: Corey Adams. MRN: [id number] Epilepsy Attending: Arleene Lack Referring Physician/Provider: Casey Clay, MD Date: 07/25/2023 Duration: 23.48 mins Patient history: 78yo M with syncope. EEG to evaluate for seizure Level of alertness: Awake AEDs during EEG study: None Technical aspects: This EEG study was done with scalp electrodes positioned according to the 10-20 International system of electrode placement. Electrical activity was reviewed with band pass filter of 1-70Hz , sensitivity of 7 uV/mm, display speed of 73mm/sec with a 60Hz  notched filter applied as appropriate. EEG data were recorded continuously and digitally stored.  Video monitoring was available and reviewed as appropriate. Description: The posterior dominant rhythm consists of 8-9 Hz activity of moderate voltage (25-35 uV) seen predominantly in posterior head regions, symmetric and reactive to eye opening and eye closing. Physiologic photic driving was not seen during photic stimulation.  Hyperventilation was not performed.   IMPRESSION: This study is within normal limits. No seizures or epileptiform discharges were seen throughout the recording. A normal interictal EEG does not exclude the diagnosis of epilepsy. Priyanka Suzanne Erps    Assessment/Plan: Lumbar spondylolisthesis, lumbar spinal stenosis: I have again discussed the situation with the patient.  I recommend he follow-up with me in the office after discharge for further discussions on treatment options.  I have answered all his questions.  Please call appointment  can be of further assistance.  LOS: 1 day     Elder Greening 07/27/2023, 6:49 AM     Patient ID: Corey Adams., male   DOB: Sep 22, 1945, 78 y.o.   MRN: [id number]

## 2023-07-27 NOTE — TOC Transition Note (Signed)
 Transition of Care [facility]) - Discharge Note   Patient Details  Name: Corey Adams. MRN: [id number] Date of Birth: Aug 18, 1945  Transition of Care [facility]) CM/SW Contact:  Jennett Model, RN Phone Number: 07/27/2023, 12:32 PM   Clinical Narrative:    For possible dc today, has no needs. Wife to transport home.         Patient Goals and CMS Choice            Discharge Placement                       Discharge Plan and Services Additional resources added to the After Visit Summary for                                       Social Drivers of Health (SDOH) Interventions SDOH Screenings   Food Insecurity: No Food Insecurity (07/26/2023)  Housing: Low Risk  (07/26/2023)  Transportation Needs: No Transportation Needs (07/26/2023)  Utilities: Low Risk  (02/20/2023)   Received from [facility]  Tobacco Use: Low Risk  (07/24/2023)     Readmission Risk Interventions    07/27/2023   12:29 PM 07/26/2023   12:52 PM  Readmission Risk Prevention Plan  Post Dischage Appt Complete Complete  Medication Screening Complete Complete  Transportation Screening Complete Complete

## 2023-07-27 NOTE — TOC CM/SW Note (Signed)
 Transition of Care [facility]) - Inpatient Brief Assessment   Patient Details  Name: Corey Adams. MRN: [id number] Date of Birth: 23-Feb-1946  Transition of Care [facility]) CM/SW Contact:    Jennett Model, RN Phone Number: 07/27/2023, 12:30 PM   Clinical Narrative: Hart [place] permission to speak with wife, From home with spouse, has PCP and insurance on file, states has no HH services in place at this time has cane  at home that he occasionally uses.   Wife will transport them home at dc and family is support system, states gets medications for short term from [facility] in [place], for long term gets mail order from [employer].  Pta self ambulatory .   Transition of Care Asessment: Insurance and Status: Insurance coverage has been reviewed Patient has primary care physician: Yes Home environment has been reviewed: home with wife Prior level of function:: indep Prior/Current Home Services: No current home services Social Drivers of Health Review: SDOH reviewed no interventions necessary Readmission risk has been reviewed: Yes Transition of care needs: no transition of care needs at this time

## 2023-08-10 DIAGNOSIS — F1290 Cannabis use, unspecified, uncomplicated: Secondary | ICD-10-CM | POA: Diagnosis not present

## 2023-08-10 DIAGNOSIS — R600 Localized edema: Secondary | ICD-10-CM | POA: Diagnosis not present

## 2023-08-10 DIAGNOSIS — R55 Syncope and collapse: Secondary | ICD-10-CM | POA: Diagnosis not present

## 2023-08-10 DIAGNOSIS — M4316 Spondylolisthesis, lumbar region: Secondary | ICD-10-CM | POA: Diagnosis not present

## 2023-08-10 DIAGNOSIS — N183 Chronic kidney disease, stage 3 unspecified: Secondary | ICD-10-CM | POA: Diagnosis not present

## 2023-08-10 DIAGNOSIS — I129 Hypertensive chronic kidney disease with stage 1 through stage 4 chronic kidney disease, or unspecified chronic kidney disease: Secondary | ICD-10-CM | POA: Diagnosis not present

## 2023-08-10 DIAGNOSIS — F330 Major depressive disorder, recurrent, mild: Secondary | ICD-10-CM | POA: Diagnosis not present

## 2023-08-15 DIAGNOSIS — M47816 Spondylosis without myelopathy or radiculopathy, lumbar region: Secondary | ICD-10-CM | POA: Diagnosis not present

## 2023-08-15 DIAGNOSIS — M48062 Spinal stenosis, lumbar region with neurogenic claudication: Secondary | ICD-10-CM | POA: Diagnosis not present

## 2023-08-16 DIAGNOSIS — I10 Essential (primary) hypertension: Secondary | ICD-10-CM | POA: Diagnosis not present

## 2023-08-31 NOTE — Discharge Summary (Signed)
 Physician Discharge Summary  Patient ID: Corey Adams. MRN: [id number] DOB/AGE: 1945-10-22 78 y.o.  Admit date: 07/24/2023 Discharge date: 07/27/2023  Admission Diagnoses:  Discharge Diagnoses:  Principal Problem:   Syncope   Discharged Condition: stable  Hospital Course: Patient is a 78 year old male with past medical history significant for hypertension, hyperlipidemia, chronic kidney disease stage IIIa, prediabetes, hyperlipidemia, GERD and alcohol use disorder currently in remission, depression, OSA, chronic back pain/spinal stenosis with recent flare for which he received spinal steroid injections as well as NSAIDs and muscle relaxants.  Patient presented after a syncopal episode at home while sitting on his recliner.  Patient was admitted for evaluation of syncope.  Hospital course complicated by severe, right hip pain/sciatica.  Orthopedics consulted, advised orthopedics advised consulting neurosurgery team.  Patient was seen by Dr. Larrie Po with neurosurgery team.  Patient will follow-up with primary care provider and neurosurgery team on discharge.  As per documentation, patient is not particularly keen on surgical approach to the back pain for now.    Syncope: -No prior history of syncope -Possible etiology includes hypotension/orthostatic hypotension, vasovagal, low index of suspicion for seizures -MRI brain was normal.  CT head was without acute abnormalities. -Troponin x 2 negative.  TSH normal.  A1c 6.   -Telemetry revealed sinus rhythm currently.     -2D echo: LVEF 70-75%.  No aortic stenosis. -EEG: Within normal limits.  No seizures. -Patient was counseled not to drive for 6 months, or until cleared by his physicians during outpatient office visit.    Stage IIIa chronic kidney disease Last known creatinine in April 2021: 1.45.   Presented with creatinine of 1.67.   Post IV hydration, creatinine improved to 1.2.   Continue to monitor closely.  Hypertension:    Antihypertensives were held initially due to hypotension, concern for orthostatic hypotension.  Blood pressures continue to be well-controlled off of meds.  Continue to monitor.   HLD Continue atorvastatin     Pre-diabetes Hemoglobin A1c 6.   GERD continue ppi    OSA -non complaint with cpap  - [place] at bedtime    Subacute on chronic back pain/intractable pain -Follow-up with neurosurgery team on discharge (Dr. Larrie Po).   Weight loss Spouse reported that patient has lost about 20 pounds since December of last year, unintentionally. No clear etiology.  Recommended close outpatient follow-up with PCP for further evaluation.   Body mass index is 28.02 kg/m.      Consults: Neurosurgery  Significant Diagnostic Studies:  MRI brain was normal.   Discharge Exam: Blood pressure 126/83, pulse 73, temperature 98.4 F (36.9 C), temperature source Oral, resp. rate 20, height 5\' 8"  (1.727 m), weight 83.5 kg, SpO2 98%.   Disposition: Discharge disposition: 01-Home or Self Care       Discharge Instructions     Diet - low sodium heart healthy   Complete by: As directed    Increase activity slowly   Complete by: As directed       Allergies as of 07/27/2023       Reactions   Shellfish Allergy Anaphylaxis   Guar Gum Hives   Xanthan Gum Hives   Codeine Nausea And Vomiting        Medication List     STOP taking these medications    bisoprolol -hydrochlorothiazide  2.5-6.25 MG tablet Commonly known as: ZIAC    CeleBREX 200 MG capsule Generic drug: celecoxib   Coenzyme Q10 100 MG capsule   diphenhydrAMINE  25 MG tablet Commonly known as: BENADRYL   GOODYS BACK & BODY PAIN PO       TAKE these medications    atorvastatin  10 MG tablet Commonly known as: LIPITOR Take 10 mg by mouth at bedtime.   escitalopram  20 MG tablet Commonly known as: LEXAPRO  Take 20 mg by mouth daily.   HYDROcodone -acetaminophen  7.5-325 mg/15 ml solution Commonly known as:  HYCET Take 15 mLs by mouth 4 (four) times daily as needed for moderate pain (pain score 4-6).   lidocaine  5 % Commonly known as: LIDODERM  Place 1 patch onto the skin daily. Remove & Discard patch within 12 hours or as directed by MD   omeprazole 40 MG capsule Commonly known as: PRILOSEC Take 40 mg by mouth daily.       ASK your doctor about these medications    methocarbamol  500 MG tablet Commonly known as: ROBAXIN  Take 1 tablet (500 mg total) by mouth 3 (three) times daily for 10 days. Ask about: Should I take this medication?        Follow-up Information     Sun, Vyvyan, MD Follow up.   Specialty: Family Medicine Contact information: [address] [place] [address] [phone]                Time spent: 35 Minutes.  SignedDoroteo Gasmen 08/31/2023, 3:07 AM

## 2023-09-03 DIAGNOSIS — K219 Gastro-esophageal reflux disease without esophagitis: Secondary | ICD-10-CM | POA: Diagnosis not present

## 2023-09-03 DIAGNOSIS — M4316 Spondylolisthesis, lumbar region: Secondary | ICD-10-CM | POA: Diagnosis not present

## 2023-09-03 DIAGNOSIS — E785 Hyperlipidemia, unspecified: Secondary | ICD-10-CM | POA: Diagnosis not present

## 2023-09-03 DIAGNOSIS — I129 Hypertensive chronic kidney disease with stage 1 through stage 4 chronic kidney disease, or unspecified chronic kidney disease: Secondary | ICD-10-CM | POA: Diagnosis not present

## 2023-09-03 DIAGNOSIS — R7303 Prediabetes: Secondary | ICD-10-CM | POA: Diagnosis not present

## 2023-09-03 DIAGNOSIS — M159 Polyosteoarthritis, unspecified: Secondary | ICD-10-CM | POA: Diagnosis not present

## 2023-09-03 DIAGNOSIS — F330 Major depressive disorder, recurrent, mild: Secondary | ICD-10-CM | POA: Diagnosis not present

## 2023-09-03 DIAGNOSIS — N1831 Chronic kidney disease, stage 3a: Secondary | ICD-10-CM | POA: Diagnosis not present

## 2023-09-05 ENCOUNTER — Other Ambulatory Visit: Payer: Self-pay | Admitting: Neurosurgery

## 2023-09-17 NOTE — Pre-Procedure Instructions (Signed)
 Surgical Instructions   Your procedure is scheduled on September 27, 2023. Report to [facility] Main Entrance A at 8:30 A.M., then check in with the Admitting office. Any questions or running late day of surgery: call [phone]  Questions prior to your surgery date: call [phone], Monday-Friday, 8am-4pm. If you experience any cold or flu symptoms such as cough, fever, chills, shortness of breath, etc. between now and your scheduled surgery, please notify us  at the above number.     Remember:  Do not eat or drink after midnight the night before your surgery    Take these medicines the morning of surgery with A SIP OF WATER: acetaminophen  (TYLENOL )  escitalopram  (LEXAPRO )  omeprazole (PRILOSEC)    May take these medicines IF NEEDED: HYDROcodone -acetaminophen  (NORCO/VICODIN)    One week prior to surgery, STOP taking any Aspirin  (unless otherwise instructed by your surgeon) Aleve, Naproxen, Ibuprofen, Motrin, Advil, Goody's, BC's, all herbal medications, fish oil, and non-prescription vitamins.                     Do NOT Smoke (Tobacco/Vaping) for 24 hours prior to your procedure.  If you use a CPAP at night, you may bring your mask/headgear for your overnight stay.   You will be asked to remove any contacts, glasses, piercing's, hearing aid's, dentures/partials prior to surgery. Please bring cases for these items if needed.    Patients discharged the day of surgery will not be allowed to drive home, and someone needs to stay with them for 24 hours.  SURGICAL WAITING ROOM VISITATION Patients may have no more than 2 support people in the waiting area - these visitors may rotate.   Pre-op nurse will coordinate an appropriate time for 1 ADULT support person, who may not rotate, to accompany patient in pre-op.  Children under the age of 78 must have an adult with them who is not the patient and must remain in the main waiting area with an adult.  If the patient needs to stay at  the hospital during part of their recovery, the visitor guidelines for inpatient rooms apply.  Please refer to the [facility] website for the visitor guidelines for any additional information.   If you received a COVID test during your pre-op visit  it is requested that you wear a mask when out in public, stay away from anyone that may not be feeling well and notify your surgeon if you develop symptoms. If you have been in contact with anyone that has tested positive in the last 10 days please notify you surgeon.      Pre-operative 5 CHG Bathing Instructions   You can play a key role in reducing the risk of infection after surgery. Your skin needs to be as free of germs as possible. You can reduce the number of germs on your skin by washing with CHG (chlorhexidine  gluconate) soap before surgery. CHG is an antiseptic soap that kills germs and continues to kill germs even after washing.   DO NOT use if you have an allergy to chlorhexidine /CHG or antibacterial soaps. If your skin becomes reddened or irritated, stop using the CHG and notify one of our RNs at [phone].   Please shower with the CHG soap starting 4 days before surgery using the following schedule:     Please keep in mind the following:  DO NOT shave, including legs and underarms, starting the day of your first shower.   You may shave your face at any  point before/day of surgery.  Place clean sheets on your bed the day you start using CHG soap. Use a clean washcloth (not used since being washed) for each shower. DO NOT sleep with pets once you start using the CHG.   CHG Shower Instructions:  Wash your face and private area with normal soap. If you choose to wash your hair, wash first with your normal shampoo.  After you use shampoo/soap, rinse your hair and body thoroughly to remove shampoo/soap residue.  Turn the water OFF and apply about 3 tablespoons (45 ml) of CHG soap to a CLEAN washcloth.  Apply CHG soap ONLY FROM  YOUR NECK DOWN TO YOUR TOES (washing for 3-5 minutes)  DO NOT use CHG soap on face, private areas, open wounds, or sores.  Pay special attention to the area where your surgery is being performed.  If you are having back surgery, having someone wash your back for you may be helpful. Wait 2 minutes after CHG soap is applied, then you may rinse off the CHG soap.  Pat dry with a clean towel  Put on clean clothes/pajamas   If you choose to wear lotion, please use ONLY the CHG-compatible lotions that are listed below.  Additional instructions for the day of surgery: DO NOT APPLY any lotions, deodorants, cologne, or perfumes.   Do not bring valuables to the hospital. [facility] is not responsible for any belongings/valuables. Do not wear nail polish, gel polish, artificial nails, or any other type of covering on natural nails (fingers and toes) Do not wear jewelry or makeup Put on clean/comfortable clothes.  Please brush your teeth.  Ask your nurse before applying any prescription medications to the skin.     CHG Compatible Lotions   Aveeno Moisturizing lotion  Cetaphil Moisturizing Cream  Cetaphil Moisturizing Lotion  Clairol Herbal Essence Moisturizing Lotion, Dry Skin  Clairol Herbal Essence Moisturizing Lotion, Extra Dry Skin  Clairol Herbal Essence Moisturizing Lotion, Normal Skin  Curel Age Defying Therapeutic Moisturizing Lotion with Alpha Hydroxy  Curel Extreme Care Body Lotion  Curel Soothing Hands Moisturizing Hand Lotion  Curel Therapeutic Moisturizing Cream, Fragrance-Free  Curel Therapeutic Moisturizing Lotion, Fragrance-Free  Curel Therapeutic Moisturizing Lotion, Original Formula  Eucerin Daily Replenishing Lotion  Eucerin Dry Skin Therapy Plus Alpha Hydroxy Crme  Eucerin Dry Skin Therapy Plus Alpha Hydroxy Lotion  Eucerin Original Crme  Eucerin Original Lotion  Eucerin Plus Crme Eucerin Plus Lotion  Eucerin TriLipid Replenishing Lotion  Keri Anti-Bacterial Hand  Lotion  Keri Deep Conditioning Original Lotion Dry Skin Formula Softly Scented  Keri Deep Conditioning Original Lotion, Fragrance Free Sensitive Skin Formula  Keri Lotion Fast Absorbing Fragrance Free Sensitive Skin Formula  Keri Lotion Fast Absorbing Softly Scented Dry Skin Formula  Keri Original Lotion  Keri Skin Renewal Lotion Keri Silky Smooth Lotion  Keri Silky Smooth Sensitive Skin Lotion  Nivea Body Creamy Conditioning Oil  Nivea Body Extra Enriched Lotion  Nivea Body Original Lotion  Nivea Body Sheer Moisturizing Lotion Nivea Crme  Nivea Skin Firming Lotion  NutraDerm 30 Skin Lotion  NutraDerm Skin Lotion  NutraDerm Therapeutic Skin Cream  NutraDerm Therapeutic Skin Lotion  ProShield Protective Hand Cream  Provon moisturizing lotion  Please read over the following fact sheets that you were given.

## 2023-09-18 ENCOUNTER — Other Ambulatory Visit: Payer: Self-pay

## 2023-09-18 ENCOUNTER — Encounter (HOSPITAL_COMMUNITY)
Admission: RE | Admit: 2023-09-18 | Discharge: 2023-09-18 | Disposition: A | Discharge status: Home / Self Care | Source: Ambulatory Visit | Attending: Neurosurgery | Admitting: Neurosurgery

## 2023-09-18 ENCOUNTER — Encounter (HOSPITAL_COMMUNITY): Payer: Self-pay

## 2023-09-18 ENCOUNTER — Other Ambulatory Visit (HOSPITAL_COMMUNITY)

## 2023-09-18 VITALS — BP 123/86 | HR 63 | Temp 98.1°F | Resp 18 | Ht 68.0 in | Wt 188.1 lb

## 2023-09-18 DIAGNOSIS — Z01812 Encounter for preprocedural laboratory examination: Secondary | ICD-10-CM | POA: Diagnosis not present

## 2023-09-18 DIAGNOSIS — I129 Hypertensive chronic kidney disease with stage 1 through stage 4 chronic kidney disease, or unspecified chronic kidney disease: Secondary | ICD-10-CM | POA: Diagnosis not present

## 2023-09-18 DIAGNOSIS — K219 Gastro-esophageal reflux disease without esophagitis: Secondary | ICD-10-CM | POA: Diagnosis not present

## 2023-09-18 DIAGNOSIS — G4733 Obstructive sleep apnea (adult) (pediatric): Secondary | ICD-10-CM | POA: Insufficient documentation

## 2023-09-18 DIAGNOSIS — N183 Chronic kidney disease, stage 3 unspecified: Secondary | ICD-10-CM | POA: Diagnosis not present

## 2023-09-18 DIAGNOSIS — Z01818 Encounter for other preprocedural examination: Secondary | ICD-10-CM | POA: Diagnosis present

## 2023-09-18 DIAGNOSIS — M25551 Pain in right hip: Secondary | ICD-10-CM | POA: Diagnosis not present

## 2023-09-18 LAB — CBC
HCT: 46.1 % (ref 39.0–52.0)
Hemoglobin: 14.5 g/dL (ref 13.0–17.0)
MCH: 29.2 pg (ref 26.0–34.0)
MCHC: 31.5 g/dL (ref 30.0–36.0)
MCV: 92.8 fL (ref 80.0–100.0)
Platelets: 237 10*3/uL (ref 150–400)
RBC: 4.97 MIL/uL (ref 4.22–5.81)
RDW: 13.3 % (ref 11.5–15.5)
WBC: 7.9 10*3/uL (ref 4.0–10.5)
nRBC: 0 % (ref 0.0–0.2)

## 2023-09-18 LAB — TYPE AND SCREEN
ABO/RH(D): O POS
Antibody Screen: NEGATIVE

## 2023-09-18 LAB — BASIC METABOLIC PANEL WITH GFR
Anion gap: 10 (ref 5–15)
BUN: 15 mg/dL (ref 8–23)
CO2: 24 mmol/L (ref 22–32)
Calcium: 9.8 mg/dL (ref 8.9–10.3)
Chloride: 105 mmol/L (ref 98–111)
Creatinine, Ser: 1.35 mg/dL — ABNORMAL HIGH (ref 0.61–1.24)
GFR, Estimated: 54 mL/min — ABNORMAL LOW (ref >60)
Glucose, Bld: 82 mg/dL (ref 70–99)
Potassium: 4.2 mmol/L (ref 3.5–5.1)
Sodium: 139 mmol/L (ref 135–145)

## 2023-09-18 LAB — SURGICAL PCR SCREEN
MRSA, PCR: NEGATIVE
Staphylococcus aureus: POSITIVE — AB

## 2023-09-18 NOTE — Progress Notes (Signed)
 PCP - [name]  Cardiologist -   PPM/ICD - denies Device Orders - n/a Rep Notified - n/a  Chest x-ray - 07-24-23 EKG - 07-24-23 Stress Test -  ECHO - 07-26-23 Cardiac Cath -   Sleep Study - per patient 20 years ago CPAP - no  Dm- denies  Blood Thinner Instructions:denies Aspirin  Instructions:n/a  ERAS Protcol -NPO   COVID TEST- n/a   Anesthesia review: yes, Hx of HTN, OSA, CKD and patient had a syncope episode in April with hospital stay per patient they do not know why this happened and no further episode noted or no dizziness  Patient denies shortness of breath, fever, cough and chest pain at PAT appointment   All instructions explained to the patient, with a verbal understanding of the material. Patient agrees to go over the instructions while at home for a better understanding. Patient also instructed to self quarantine after being tested for COVID-19. The opportunity to ask questions was provided.

## 2023-09-19 NOTE — Anesthesia Preprocedure Evaluation (Signed)
 Anesthesia Evaluation    Airway        Dental   Pulmonary           Cardiovascular hypertension,      Neuro/Psych    GI/Hepatic   Endo/Other    Renal/GU      Musculoskeletal   Abdominal   Peds  Hematology   Anesthesia Other Findings   Reproductive/Obstetrics                              Anesthesia Physical Anesthesia Plan  ASA:   Anesthesia Plan:    Post-op Pain Management:    Induction:   PONV Risk Score and Plan:   Airway Management Planned:   Additional Equipment:   Intra-op Plan:   Post-operative Plan:   Informed Consent:   Plan Discussed with:   Anesthesia Plan Comments: (PAT note by Rudy Costain, PA-C: 78 year old male with pertinent history including HTN, HLD, GERD on PPI, OSA noncompliant with CPAP, CKD 3.  Recent admission 4/22 through 07/27/2023 for syncopal episode.  Workup was benign.  MRI brain was normal, CT head without acute abnormalities, troponin negative x 2, TSH normal, telemetry revealed sinus rhythm, echo with EF 70 to 75%, no significant valvular abnormalities, EEG WNL, no seizures.  Possible etiologies felt to include hypotension, orthostatic hypotension, vasovagal.  Hospital course complicated by severe right hip pain/sciatica.  Neurosurgery was consulted and recommended outpatient follow-up.  Post discharge he had multiple follow-ups with Dr. Currie Douse and Dr. Paulene Boron at [facility].  When last seen by Dr. Levin Reamer on 09/03/2023 it was noted that his blood pressure was under good control.  He denied any chest pain, shortness of breath, or any other cardiopulmonary complaints.  It was also noted that he continued to follow with Dr. Larrie Po for surgical evaluation of lumbar spine pathology.  Preop labs reviewed, creatinine mildly elevated 1.35, otherwise WNL.  EKG 07/24/2023: Sinus rhythm.  Rate 81.  Right axis deviation.  TTE 07/26/2023: 1. Left ventricular ejection  fraction, by estimation, is 70 to 75%. The  left ventricle has hyperdynamic function. The left ventricle has no  regional wall motion abnormalities. There is mild left ventricular  hypertrophy of the basal-septal segment. Left  ventricular diastolic parameters are indeterminate.   2. Right ventricular systolic function is normal. The right ventricular  size is normal. There is mildly elevated pulmonary artery systolic  pressure.   3. The mitral valve is normal in structure. No evidence of mitral valve  regurgitation. No evidence of mitral stenosis.   4. The aortic valve is normal in structure. Aortic valve regurgitation is  not visualized. No aortic stenosis is present.   5. The inferior vena cava is normal in size with greater than 50%  respiratory variability, suggesting right atrial pressure of 3 mmHg.    )         Anesthesia Quick Evaluation

## 2023-09-19 NOTE — Progress Notes (Signed)
 Anesthesia Chart Review:  78 year old male with pertinent history including HTN, HLD, GERD on PPI, OSA noncompliant with CPAP, CKD 3.  Recent admission 4/22 through 07/27/2023 for syncopal episode.  Workup was benign.  MRI brain was normal, CT head without acute abnormalities, troponin negative x 2, TSH normal, telemetry revealed sinus rhythm, echo with EF 70 to 75%, no significant valvular abnormalities, EEG WNL, no seizures.  Possible etiologies felt to include hypotension, orthostatic hypotension, vasovagal.  Hospital course complicated by severe right hip pain/sciatica.  Neurosurgery was consulted and recommended outpatient follow-up.  Post discharge he had multiple follow-ups with Dr. Currie Douse and Dr. Paulene Boron at [facility].  When last seen by Dr. Levin Reamer on 09/03/2023 it was noted that his blood pressure was under good control.  He denied any chest pain, shortness of breath, or any other cardiopulmonary complaints.  It was also noted that he continued to follow with Dr. Larrie Po for surgical evaluation of lumbar spine pathology.  Preop labs reviewed, creatinine mildly elevated 1.35, otherwise WNL.  EKG 07/24/2023: Sinus rhythm.  Rate 81.  Right axis deviation.  TTE 07/26/2023: 1. Left ventricular ejection fraction, by estimation, is 70 to 75%. The  left ventricle has hyperdynamic function. The left ventricle has no  regional wall motion abnormalities. There is mild left ventricular  hypertrophy of the basal-septal segment. Left  ventricular diastolic parameters are indeterminate.   2. Right ventricular systolic function is normal. The right ventricular  size is normal. There is mildly elevated pulmonary artery systolic  pressure.   3. The mitral valve is normal in structure. No evidence of mitral valve  regurgitation. No evidence of mitral stenosis.   4. The aortic valve is normal in structure. Aortic valve regurgitation is  not visualized. No aortic stenosis is present.   5. The inferior vena cava  is normal in size with greater than 50%  respiratory variability, suggesting right atrial pressure of 3 mmHg.     Edilia Gordon [facility] Short Stay Center/Anesthesiology Phone [phone] 09/19/2023 2:41 PM

## 2023-09-27 ENCOUNTER — Ambulatory Visit (HOSPITAL_COMMUNITY)
Admission: RE | Disposition: A | Discharge status: Home / Self Care | Payer: Self-pay | Source: Home / Self Care | Attending: Neurosurgery

## 2023-09-27 ENCOUNTER — Ambulatory Visit (HOSPITAL_COMMUNITY)

## 2023-09-27 ENCOUNTER — Encounter (HOSPITAL_COMMUNITY): Payer: Self-pay | Admitting: Neurosurgery

## 2023-09-27 ENCOUNTER — Ambulatory Visit (HOSPITAL_COMMUNITY): Admitting: Certified Registered Nurse Anesthetist

## 2023-09-27 ENCOUNTER — Ambulatory Visit (HOSPITAL_COMMUNITY): Payer: Self-pay | Admitting: Physician Assistant

## 2023-09-27 ENCOUNTER — Other Ambulatory Visit: Payer: Self-pay

## 2023-09-27 ENCOUNTER — Ambulatory Visit (HOSPITAL_COMMUNITY)
Admission: RE | Admit: 2023-09-27 | Discharge: 2023-09-29 | Disposition: A | Discharge status: Home / Self Care | Source: Home / Self Care | Attending: Neurosurgery | Admitting: Neurosurgery

## 2023-09-27 DIAGNOSIS — M545 Low back pain, unspecified: Secondary | ICD-10-CM | POA: Diagnosis not present

## 2023-09-27 DIAGNOSIS — Z981 Arthrodesis status: Secondary | ICD-10-CM | POA: Diagnosis not present

## 2023-09-27 DIAGNOSIS — Z79899 Other long term (current) drug therapy: Secondary | ICD-10-CM | POA: Insufficient documentation

## 2023-09-27 DIAGNOSIS — M542 Cervicalgia: Secondary | ICD-10-CM | POA: Diagnosis not present

## 2023-09-27 DIAGNOSIS — F419 Anxiety disorder, unspecified: Secondary | ICD-10-CM | POA: Insufficient documentation

## 2023-09-27 DIAGNOSIS — K219 Gastro-esophageal reflux disease without esophagitis: Secondary | ICD-10-CM | POA: Insufficient documentation

## 2023-09-27 DIAGNOSIS — R531 Weakness: Secondary | ICD-10-CM | POA: Diagnosis present

## 2023-09-27 DIAGNOSIS — Z043 Encounter for examination and observation following other accident: Secondary | ICD-10-CM | POA: Diagnosis not present

## 2023-09-27 DIAGNOSIS — M4316 Spondylolisthesis, lumbar region: Secondary | ICD-10-CM | POA: Insufficient documentation

## 2023-09-27 DIAGNOSIS — M48062 Spinal stenosis, lumbar region with neurogenic claudication: Secondary | ICD-10-CM | POA: Diagnosis not present

## 2023-09-27 DIAGNOSIS — R339 Retention of urine, unspecified: Secondary | ICD-10-CM | POA: Diagnosis present

## 2023-09-27 DIAGNOSIS — Z96612 Presence of left artificial shoulder joint: Secondary | ICD-10-CM | POA: Diagnosis not present

## 2023-09-27 DIAGNOSIS — M47812 Spondylosis without myelopathy or radiculopathy, cervical region: Secondary | ICD-10-CM | POA: Diagnosis not present

## 2023-09-27 DIAGNOSIS — I700 Atherosclerosis of aorta: Secondary | ICD-10-CM | POA: Diagnosis not present

## 2023-09-27 DIAGNOSIS — I10 Essential (primary) hypertension: Secondary | ICD-10-CM

## 2023-09-27 DIAGNOSIS — R338 Other retention of urine: Secondary | ICD-10-CM | POA: Insufficient documentation

## 2023-09-27 DIAGNOSIS — R509 Fever, unspecified: Secondary | ICD-10-CM | POA: Diagnosis present

## 2023-09-27 DIAGNOSIS — G4730 Sleep apnea, unspecified: Secondary | ICD-10-CM | POA: Insufficient documentation

## 2023-09-27 DIAGNOSIS — W19XXXA Unspecified fall, initial encounter: Secondary | ICD-10-CM | POA: Diagnosis not present

## 2023-09-27 DIAGNOSIS — N183 Chronic kidney disease, stage 3 unspecified: Secondary | ICD-10-CM | POA: Insufficient documentation

## 2023-09-27 DIAGNOSIS — M5116 Intervertebral disc disorders with radiculopathy, lumbar region: Secondary | ICD-10-CM | POA: Diagnosis present

## 2023-09-27 DIAGNOSIS — I6529 Occlusion and stenosis of unspecified carotid artery: Secondary | ICD-10-CM | POA: Diagnosis not present

## 2023-09-27 DIAGNOSIS — M4150 Other secondary scoliosis, site unspecified: Secondary | ICD-10-CM | POA: Diagnosis present

## 2023-09-27 DIAGNOSIS — F32A Depression, unspecified: Secondary | ICD-10-CM | POA: Insufficient documentation

## 2023-09-27 DIAGNOSIS — K5900 Constipation, unspecified: Secondary | ICD-10-CM | POA: Diagnosis not present

## 2023-09-27 DIAGNOSIS — M4156 Other secondary scoliosis, lumbar region: Secondary | ICD-10-CM | POA: Insufficient documentation

## 2023-09-27 DIAGNOSIS — J45909 Unspecified asthma, uncomplicated: Secondary | ICD-10-CM | POA: Diagnosis not present

## 2023-09-27 DIAGNOSIS — M47816 Spondylosis without myelopathy or radiculopathy, lumbar region: Secondary | ICD-10-CM | POA: Diagnosis not present

## 2023-09-27 DIAGNOSIS — N9989 Other postprocedural complications and disorders of genitourinary system: Secondary | ICD-10-CM | POA: Insufficient documentation

## 2023-09-27 DIAGNOSIS — Z96611 Presence of right artificial shoulder joint: Secondary | ICD-10-CM | POA: Diagnosis not present

## 2023-09-27 DIAGNOSIS — K449 Diaphragmatic hernia without obstruction or gangrene: Secondary | ICD-10-CM | POA: Diagnosis not present

## 2023-09-27 DIAGNOSIS — M4726 Other spondylosis with radiculopathy, lumbar region: Secondary | ICD-10-CM | POA: Diagnosis present

## 2023-09-27 DIAGNOSIS — M5416 Radiculopathy, lumbar region: Secondary | ICD-10-CM | POA: Insufficient documentation

## 2023-09-27 DIAGNOSIS — S199XXA Unspecified injury of neck, initial encounter: Secondary | ICD-10-CM | POA: Diagnosis not present

## 2023-09-27 DIAGNOSIS — I129 Hypertensive chronic kidney disease with stage 1 through stage 4 chronic kidney disease, or unspecified chronic kidney disease: Secondary | ICD-10-CM | POA: Insufficient documentation

## 2023-09-27 DIAGNOSIS — E785 Hyperlipidemia, unspecified: Secondary | ICD-10-CM | POA: Diagnosis present

## 2023-09-27 DIAGNOSIS — R0989 Other specified symptoms and signs involving the circulatory and respiratory systems: Secondary | ICD-10-CM | POA: Diagnosis not present

## 2023-09-27 DIAGNOSIS — S3992XA Unspecified injury of lower back, initial encounter: Secondary | ICD-10-CM | POA: Diagnosis not present

## 2023-09-27 DIAGNOSIS — R6510 Systemic inflammatory response syndrome (SIRS) of non-infectious origin without acute organ dysfunction: Secondary | ICD-10-CM | POA: Diagnosis present

## 2023-09-27 DIAGNOSIS — Z1152 Encounter for screening for COVID-19: Secondary | ICD-10-CM | POA: Diagnosis not present

## 2023-09-27 DIAGNOSIS — R000 Tachycardia, unspecified: Secondary | ICD-10-CM | POA: Diagnosis present

## 2023-09-27 DIAGNOSIS — M5126 Other intervertebral disc displacement, lumbar region: Secondary | ICD-10-CM | POA: Diagnosis not present

## 2023-09-27 HISTORY — DX: Retention of urine, unspecified: R33.9

## 2023-09-27 SURGERY — POSTERIOR LUMBAR FUSION 3 LEVEL
Anesthesia: General | Site: Spine Lumbar

## 2023-09-27 MED ORDER — BISOPROLOL-HYDROCHLOROTHIAZIDE 2.5-6.25 MG PO TABS: 1.0000 | ORAL_TABLET | Freq: Every day | ORAL | Status: DC

## 2023-09-27 MED ORDER — PROPOFOL 10 MG/ML IV BOLUS
INTRAVENOUS | Status: AC
  Filled 2023-09-27: qty 20

## 2023-09-27 MED ORDER — VASHE WOUND IRRIGATION OPTIME
TOPICAL | Status: DC | PRN
  Administered 2023-09-27: 34 [oz_av]

## 2023-09-27 MED ORDER — BUPIVACAINE LIPOSOME 1.3 % IJ SUSP
INTRAMUSCULAR | Status: DC | PRN
  Administered 2023-09-27: 20 mL

## 2023-09-27 MED ORDER — ONDANSETRON HCL 4 MG PO TABS: 4.0000 mg | ORAL_TABLET | Freq: Four times a day (QID) | ORAL | Status: DC | PRN

## 2023-09-27 MED ORDER — SUGAMMADEX SODIUM 200 MG/2ML IV SOLN
INTRAVENOUS | Status: DC | PRN
  Administered 2023-09-27: 200 mg via INTRAVENOUS

## 2023-09-27 MED ORDER — LIDOCAINE 2% (20 MG/ML) 5 ML SYRINGE
INTRAMUSCULAR | Status: DC | PRN
  Administered 2023-09-27: 60 mg via INTRAVENOUS

## 2023-09-27 MED ORDER — LACTATED RINGERS IV SOLN: INTRAVENOUS | Status: DC | PRN

## 2023-09-27 MED ORDER — OXYCODONE HCL 5 MG PO TABS
10.0000 mg | ORAL_TABLET | ORAL | Status: DC | PRN
  Administered 2023-09-29: 10 mg via ORAL
  Filled 2023-09-27: qty 2

## 2023-09-27 MED ORDER — SODIUM CHLORIDE 0.9 % IV SOLN
INTRAVENOUS | Status: DC | PRN
Start: 2023-09-27 — End: 2023-09-27

## 2023-09-27 MED ORDER — CEFAZOLIN SODIUM-DEXTROSE 2-4 GM/100ML-% IV SOLN
2.0000 g | INTRAVENOUS | Status: AC
  Administered 2023-09-27 (×2): 2 g via INTRAVENOUS
  Filled 2023-09-27: qty 100

## 2023-09-27 MED ORDER — CYCLOBENZAPRINE HCL 10 MG PO TABS
10.0000 mg | ORAL_TABLET | Freq: Three times a day (TID) | ORAL | Status: DC | PRN
  Administered 2023-09-29: 10 mg via ORAL
  Filled 2023-09-27: qty 1

## 2023-09-27 MED ORDER — FENTANYL CITRATE (PF) 250 MCG/5ML IJ SOLN
INTRAMUSCULAR | Status: AC
  Filled 2023-09-27: qty 5

## 2023-09-27 MED ORDER — SODIUM CHLORIDE 0.9% FLUSH: 3.0000 mL | INTRAVENOUS | Status: DC | PRN

## 2023-09-27 MED ORDER — HYDROMORPHONE HCL 1 MG/ML IJ SOLN
INTRAMUSCULAR | Status: DC | PRN
  Administered 2023-09-27: .5 mg via INTRAVENOUS

## 2023-09-27 MED ORDER — SODIUM CHLORIDE 0.9% FLUSH
3.0000 mL | Freq: Two times a day (BID) | INTRAVENOUS | Status: DC
  Administered 2023-09-27 – 2023-09-28 (×3): 3 mL via INTRAVENOUS

## 2023-09-27 MED ORDER — PHENOL 1.4 % MT LIQD: 1.0000 | OROMUCOSAL | Status: DC | PRN

## 2023-09-27 MED ORDER — SENNA 8.6 MG PO TABS: 1.0000 | ORAL_TABLET | Freq: Two times a day (BID) | ORAL | Status: DC | PRN

## 2023-09-27 MED ORDER — ONDANSETRON HCL 4 MG/2ML IJ SOLN: 4.0000 mg | Freq: Four times a day (QID) | INTRAMUSCULAR | Status: DC | PRN

## 2023-09-27 MED ORDER — LACTATED RINGERS IV SOLN: INTRAVENOUS | Status: DC

## 2023-09-27 MED ORDER — ACETAMINOPHEN 325 MG PO TABS: 650.0000 mg | ORAL_TABLET | ORAL | Status: DC | PRN

## 2023-09-27 MED ORDER — ACETAMINOPHEN 10 MG/ML IV SOLN
INTRAVENOUS | Status: DC | PRN
  Administered 2023-09-27: 1000 mg via INTRAVENOUS

## 2023-09-27 MED ORDER — FENTANYL CITRATE (PF) 100 MCG/2ML IJ SOLN
25.0000 ug | INTRAMUSCULAR | Status: DC | PRN
  Administered 2023-09-27 (×2): 25 ug via INTRAVENOUS

## 2023-09-27 MED ORDER — BUPIVACAINE-EPINEPHRINE (PF) 0.5% -1:200000 IJ SOLN
INTRAMUSCULAR | Status: DC | PRN
  Administered 2023-09-27: 10 mL

## 2023-09-27 MED ORDER — ACETAMINOPHEN 650 MG RE SUPP: 650.0000 mg | RECTAL | Status: DC | PRN

## 2023-09-27 MED ORDER — ROCURONIUM BROMIDE 10 MG/ML (PF) SYRINGE
PREFILLED_SYRINGE | INTRAVENOUS | Status: DC | PRN
  Administered 2023-09-27: 15 mg via INTRAVENOUS
  Administered 2023-09-27: 20 mg via INTRAVENOUS
  Administered 2023-09-27: 60 mg via INTRAVENOUS
  Administered 2023-09-27: 30 mg via INTRAVENOUS
  Administered 2023-09-27: 20 mg via INTRAVENOUS
  Administered 2023-09-27: 40 mg via INTRAVENOUS

## 2023-09-27 MED ORDER — CHLORHEXIDINE GLUCONATE 0.12 % MT SOLN
15.0000 mL | Freq: Once | OROMUCOSAL | Status: AC
Start: 2023-09-27 — End: 2023-09-27
  Administered 2023-09-27: 15 mL via OROMUCOSAL
  Filled 2023-09-27: qty 15

## 2023-09-27 MED ORDER — OXYCODONE HCL 5 MG PO TABS
5.0000 mg | ORAL_TABLET | ORAL | Status: DC | PRN
  Administered 2023-09-28: 5 mg via ORAL
  Filled 2023-09-27 (×2): qty 1

## 2023-09-27 MED ORDER — DEXAMETHASONE SODIUM PHOSPHATE 10 MG/ML IJ SOLN
INTRAMUSCULAR | Status: DC | PRN
  Administered 2023-09-27: 10 mg via INTRAVENOUS

## 2023-09-27 MED ORDER — ALBUMIN HUMAN 5 % IV SOLN: INTRAVENOUS | Status: DC | PRN

## 2023-09-27 MED ORDER — MENTHOL 3 MG MT LOZG: 1.0000 | LOZENGE | OROMUCOSAL | Status: DC | PRN

## 2023-09-27 MED ORDER — ROCURONIUM BROMIDE 10 MG/ML (PF) SYRINGE
PREFILLED_SYRINGE | INTRAVENOUS | Status: AC
  Filled 2023-09-27: qty 10

## 2023-09-27 MED ORDER — OXYCODONE HCL 5 MG PO TABS
ORAL_TABLET | ORAL | Status: AC
  Filled 2023-09-27: qty 1

## 2023-09-27 MED ORDER — HYDROMORPHONE HCL 1 MG/ML IJ SOLN
INTRAMUSCULAR | Status: AC
  Filled 2023-09-27: qty 0.5

## 2023-09-27 MED ORDER — MORPHINE SULFATE (PF) 2 MG/ML IV SOLN: 2.0000 mg | INTRAVENOUS | Status: DC | PRN

## 2023-09-27 MED ORDER — ONDANSETRON HCL 4 MG/2ML IJ SOLN
INTRAMUSCULAR | Status: AC
  Filled 2023-09-27: qty 2

## 2023-09-27 MED ORDER — BACITRACIN ZINC 500 UNIT/GM EX OINT
TOPICAL_OINTMENT | CUTANEOUS | Status: DC | PRN
  Administered 2023-09-27: 1 via TOPICAL

## 2023-09-27 MED ORDER — DOCUSATE SODIUM 100 MG PO CAPS
100.0000 mg | ORAL_CAPSULE | Freq: Two times a day (BID) | ORAL | Status: DC
  Administered 2023-09-27 – 2023-09-29 (×4): 100 mg via ORAL
  Filled 2023-09-27 (×4): qty 1

## 2023-09-27 MED ORDER — BISACODYL 10 MG RE SUPP: 10.0000 mg | Freq: Every day | RECTAL | Status: DC | PRN

## 2023-09-27 MED ORDER — ORAL CARE MOUTH RINSE: 15.0000 mL | Freq: Once | OROMUCOSAL | Status: AC

## 2023-09-27 MED ORDER — DEXAMETHASONE SODIUM PHOSPHATE 10 MG/ML IJ SOLN
INTRAMUSCULAR | Status: AC
  Filled 2023-09-27: qty 1

## 2023-09-27 MED ORDER — BUPIVACAINE LIPOSOME 1.3 % IJ SUSP
INTRAMUSCULAR | Status: AC
  Filled 2023-09-27: qty 20

## 2023-09-27 MED ORDER — LIDOCAINE 2% (20 MG/ML) 5 ML SYRINGE
INTRAMUSCULAR | Status: AC
  Filled 2023-09-27: qty 5

## 2023-09-27 MED ORDER — PANTOPRAZOLE SODIUM 40 MG PO TBEC
80.0000 mg | DELAYED_RELEASE_TABLET | Freq: Every day | ORAL | Status: DC
  Administered 2023-09-28 – 2023-09-29 (×2): 80 mg via ORAL
  Filled 2023-09-27 (×2): qty 2

## 2023-09-27 MED ORDER — OXYCODONE HCL 5 MG PO TABS
5.0000 mg | ORAL_TABLET | Freq: Once | ORAL | Status: AC | PRN
  Administered 2023-09-27: 5 mg via ORAL

## 2023-09-27 MED ORDER — OXYCODONE HCL 5 MG/5ML PO SOLN: 5.0000 mg | Freq: Once | ORAL | Status: AC | PRN

## 2023-09-27 MED ORDER — CHLORHEXIDINE GLUCONATE CLOTH 2 % EX PADS: 6.0000 | MEDICATED_PAD | Freq: Once | CUTANEOUS | Status: DC

## 2023-09-27 MED ORDER — BISOPROLOL FUMARATE 5 MG PO TABS
2.5000 mg | ORAL_TABLET | Freq: Every day | ORAL | Status: DC
  Administered 2023-09-27 – 2023-09-29 (×3): 2.5 mg via ORAL
  Filled 2023-09-27 (×3): qty 1

## 2023-09-27 MED ORDER — BUPIVACAINE-EPINEPHRINE (PF) 0.5% -1:200000 IJ SOLN
INTRAMUSCULAR | Status: AC
  Filled 2023-09-27: qty 30

## 2023-09-27 MED ORDER — FENTANYL CITRATE (PF) 250 MCG/5ML IJ SOLN
INTRAMUSCULAR | Status: DC | PRN
  Administered 2023-09-27 (×3): 50 ug via INTRAVENOUS
  Administered 2023-09-27: 100 ug via INTRAVENOUS

## 2023-09-27 MED ORDER — HYDROCHLOROTHIAZIDE 12.5 MG PO TABS
6.2500 mg | ORAL_TABLET | Freq: Every day | ORAL | Status: DC
  Administered 2023-09-27 – 2023-09-29 (×3): 6.25 mg via ORAL
  Filled 2023-09-27 (×3): qty 1

## 2023-09-27 MED ORDER — PHENYLEPHRINE 80 MCG/ML (10ML) SYRINGE FOR IV PUSH (FOR BLOOD PRESSURE SUPPORT)
PREFILLED_SYRINGE | INTRAVENOUS | Status: DC | PRN
  Administered 2023-09-27 (×2): 80 ug via INTRAVENOUS
  Administered 2023-09-27: 160 ug via INTRAVENOUS

## 2023-09-27 MED ORDER — PROPOFOL 10 MG/ML IV BOLUS
INTRAVENOUS | Status: DC | PRN
  Administered 2023-09-27: 160 mg via INTRAVENOUS

## 2023-09-27 MED ORDER — THROMBIN 5000 UNITS EX SOLR
OROMUCOSAL | Status: DC | PRN
  Administered 2023-09-27 (×3): 5 mL via TOPICAL

## 2023-09-27 MED ORDER — PHENYLEPHRINE 80 MCG/ML (10ML) SYRINGE FOR IV PUSH (FOR BLOOD PRESSURE SUPPORT)
PREFILLED_SYRINGE | INTRAVENOUS | Status: AC
  Filled 2023-09-27: qty 10

## 2023-09-27 MED ORDER — BISACODYL 5 MG PO TBEC
5.0000 mg | DELAYED_RELEASE_TABLET | Freq: Every day | ORAL | Status: DC | PRN
  Administered 2023-09-27: 5 mg via ORAL
  Filled 2023-09-27: qty 1

## 2023-09-27 MED ORDER — ACETAMINOPHEN 500 MG PO TABS
1000.0000 mg | ORAL_TABLET | Freq: Four times a day (QID) | ORAL | Status: AC
Start: 2023-09-28 — End: 2023-09-28
  Administered 2023-09-27 – 2023-09-28 (×4): 1000 mg via ORAL
  Filled 2023-09-27 (×4): qty 2

## 2023-09-27 MED ORDER — CEFAZOLIN SODIUM 1 G IJ SOLR
INTRAMUSCULAR | Status: AC
Start: 2023-09-27 — End: 2023-09-27
  Filled 2023-09-27: qty 20

## 2023-09-27 MED ORDER — ATORVASTATIN CALCIUM 10 MG PO TABS
10.0000 mg | ORAL_TABLET | Freq: Every day | ORAL | Status: DC
  Administered 2023-09-28: 10 mg via ORAL
  Filled 2023-09-27: qty 1

## 2023-09-27 MED ORDER — ACETAMINOPHEN 10 MG/ML IV SOLN
INTRAVENOUS | Status: AC
Start: 2023-09-27 — End: 2023-09-27
  Filled 2023-09-27: qty 100

## 2023-09-27 MED ORDER — PHENYLEPHRINE HCL-NACL 20-0.9 MG/250ML-% IV SOLN
INTRAVENOUS | Status: DC | PRN
  Administered 2023-09-27: 75 ug/min via INTRAVENOUS

## 2023-09-27 MED ORDER — THROMBIN 5000 UNITS EX KIT
PACK | CUTANEOUS | Status: AC
  Filled 2023-09-27: qty 1

## 2023-09-27 MED ORDER — BACITRACIN ZINC 500 UNIT/GM EX OINT
TOPICAL_OINTMENT | CUTANEOUS | Status: AC
  Filled 2023-09-27: qty 28.35

## 2023-09-27 MED ORDER — LORATADINE 10 MG PO TABS: 10.0000 mg | ORAL_TABLET | Freq: Every day | ORAL | Status: DC | PRN

## 2023-09-27 MED ORDER — ONDANSETRON HCL 4 MG/2ML IJ SOLN
INTRAMUSCULAR | Status: DC | PRN
  Administered 2023-09-27: 4 mg via INTRAVENOUS

## 2023-09-27 MED ORDER — FENTANYL CITRATE (PF) 100 MCG/2ML IJ SOLN
INTRAMUSCULAR | Status: AC
  Filled 2023-09-27: qty 2

## 2023-09-27 MED ORDER — CEFAZOLIN SODIUM-DEXTROSE 2-4 GM/100ML-% IV SOLN
2.0000 g | Freq: Three times a day (TID) | INTRAVENOUS | Status: AC
  Administered 2023-09-28 (×2): 2 g via INTRAVENOUS
  Filled 2023-09-27 (×2): qty 100

## 2023-09-27 MED ORDER — SODIUM CHLORIDE 0.9 % IV SOLN: 250.0000 mL | INTRAVENOUS | Status: AC

## 2023-09-27 MED ORDER — 0.9 % SODIUM CHLORIDE (POUR BTL) OPTIME
TOPICAL | Status: DC | PRN
Start: 2023-09-27 — End: 2023-09-27
  Administered 2023-09-27: 1000 mL

## 2023-09-27 MED ORDER — ESCITALOPRAM OXALATE 10 MG PO TABS
10.0000 mg | ORAL_TABLET | Freq: Every day | ORAL | Status: DC
  Administered 2023-09-28 – 2023-09-29 (×2): 10 mg via ORAL
  Filled 2023-09-27 (×2): qty 1

## 2023-09-27 SURGICAL SUPPLY — 64 items
BAG COUNTER SPONGE SURGICOUNT (BAG) ×1 IMPLANT
BASKET BONE COLLECTION (BASKET) ×1 IMPLANT
BENZOIN TINCTURE PRP APPL 2/3 (GAUZE/BANDAGES/DRESSINGS) ×1 IMPLANT
BLADE CLIPPER SURG (BLADE) IMPLANT
BUR MATCHSTICK NEURO 3.0 LAGG (BURR) ×1 IMPLANT
BUR PRECISION FLUTE 6.0 (BURR) ×1 IMPLANT
CAGE SABLE 10X30 6-12 8D (Cage) IMPLANT
CAGE SABLE 10X30 9-16 8D (Cage) IMPLANT
CANISTER SUCTION 3000ML PPV (SUCTIONS) ×1 IMPLANT
CAP LOCK DLX THRD (Cap) IMPLANT
CLEANSER WND VASHE INSTL 34OZ (WOUND CARE) ×1 IMPLANT
CNTNR URN SCR LID CUP LEK RST (MISCELLANEOUS) ×1 IMPLANT
COVER BACK TABLE 60X90IN (DRAPES) ×1 IMPLANT
DRAPE C-ARM 42X72 X-RAY (DRAPES) ×2 IMPLANT
DRAPE HALF SHEET 40X57 (DRAPES) ×1 IMPLANT
DRAPE LAPAROTOMY 100X72X124 (DRAPES) ×1 IMPLANT
DRAPE SURG 17X23 STRL (DRAPES) ×1 IMPLANT
DRSG OPSITE POSTOP 4X6 (GAUZE/BANDAGES/DRESSINGS) ×1 IMPLANT
ELECTRODE BLDE 4.0 EZ CLN MEGD (MISCELLANEOUS) ×1 IMPLANT
ELECTRODE REM PT RTRN 9FT ADLT (ELECTROSURGICAL) ×1 IMPLANT
EVACUATOR 1/8 PVC DRAIN (DRAIN) ×1 IMPLANT
GAUZE 4X4 16PLY ~~LOC~~+RFID DBL (SPONGE) ×1 IMPLANT
GLOVE BIO SURGEON STRL SZ 6 (GLOVE) ×1 IMPLANT
GLOVE BIO SURGEON STRL SZ8 (GLOVE) ×2 IMPLANT
GLOVE BIO SURGEON STRL SZ8.5 (GLOVE) ×2 IMPLANT
GLOVE BIOGEL PI IND STRL 6.5 (GLOVE) ×1 IMPLANT
GLOVE BIOGEL PI IND STRL 7.5 (GLOVE) IMPLANT
GLOVE EXAM NITRILE XL STR (GLOVE) IMPLANT
GOWN STRL REUS W/ TWL LRG LVL3 (GOWN DISPOSABLE) ×1 IMPLANT
GOWN STRL REUS W/ TWL XL LVL3 (GOWN DISPOSABLE) ×2 IMPLANT
GOWN STRL REUS W/TWL 2XL LVL3 (GOWN DISPOSABLE) IMPLANT
HEMOSTAT POWDER KIT SURGIFOAM (HEMOSTASIS) ×1 IMPLANT
KIT BASIN OR (CUSTOM PROCEDURE TRAY) ×1 IMPLANT
KIT GRAFTMAG DEL NEURO DISP (NEUROSURGERY SUPPLIES) IMPLANT
KIT POSITIONER JACKSON TABLE (MISCELLANEOUS) ×1 IMPLANT
KIT TURNOVER KIT B (KITS) ×1 IMPLANT
NDL HYPO 21X1.5 SAFETY (NEEDLE) ×1 IMPLANT
NDL HYPO 22X1.5 SAFETY MO (MISCELLANEOUS) ×1 IMPLANT
NEEDLE HYPO 21X1.5 SAFETY (NEEDLE) ×1 IMPLANT
NEEDLE HYPO 22X1.5 SAFETY MO (MISCELLANEOUS) ×1 IMPLANT
NS IRRIG 1000ML POUR BTL (IV SOLUTION) ×1 IMPLANT
PACK LAMINECTOMY NEURO (CUSTOM PROCEDURE TRAY) ×1 IMPLANT
PAD ARMBOARD POSITIONER FOAM (MISCELLANEOUS) ×3 IMPLANT
PATTIES SURGICAL .5 X.5 (GAUZE/BANDAGES/DRESSINGS) ×1 IMPLANT
PATTIES SURGICAL .5 X1 (DISPOSABLE) IMPLANT
PATTIES SURGICAL 1X1 (DISPOSABLE) ×1 IMPLANT
PUTTY DBM 10CC CALC GRAN (Putty) IMPLANT
ROD CURVED TI 6.35X80 (Rod) IMPLANT
ROD CURVED TI 6.35X90 (Rod) IMPLANT
SCREW PA DLX CREO 7.5X50 (Screw) IMPLANT
SCREW PA DLX CREO 7.5X55 (Screw) IMPLANT
SPIKE FLUID TRANSFER (MISCELLANEOUS) ×1 IMPLANT
SPONGE NEURO XRAY DETECT 1X3 (DISPOSABLE) IMPLANT
SPONGE SURGIFOAM ABS GEL 100 (HEMOSTASIS) IMPLANT
SPONGE T-LAP 4X18 ~~LOC~~+RFID (SPONGE) IMPLANT
STRIP CLOSURE SKIN 1/2X4 (GAUZE/BANDAGES/DRESSINGS) ×1 IMPLANT
SUT VIC AB 1 CT1 18XBRD ANBCTR (SUTURE) ×2 IMPLANT
SUT VIC AB 2-0 CP2 18 (SUTURE) ×2 IMPLANT
SYR 20ML LL LF (SYRINGE) IMPLANT
SYR 30ML SLIP (SYRINGE) ×1 IMPLANT
TOWEL GREEN STERILE (TOWEL DISPOSABLE) ×1 IMPLANT
TOWEL GREEN STERILE FF (TOWEL DISPOSABLE) ×1 IMPLANT
TRAY FOLEY MTR SLVR 16FR STAT (SET/KITS/TRAYS/PACK) ×1 IMPLANT
WATER STERILE IRR 1000ML POUR (IV SOLUTION) ×1 IMPLANT

## 2023-09-27 NOTE — Anesthesia Procedure Notes (Signed)
 Arterial Line Insertion Start/End6/26/2025 10:15 AM, 09/27/2023 10:19 AM Performed by: Boyce Shilling, CRNA, CRNA  Preanesthetic checklist: patient identified, IV checked, site marked, risks and benefits discussed, surgical consent, monitors and equipment checked, pre-op evaluation, timeout performed and anesthesia consent Lidocaine  1% used for infiltration Left, radial was placed Catheter size: 20 G Hand hygiene performed  and maximum sterile barriers used  Allen's test indicative of satisfactory collateral circulation Attempts: 1 Procedure performed without using ultrasound guided technique. Following insertion, dressing applied and Biopatch. Post procedure assessment: normal  Patient tolerated the procedure well with no immediate complications.

## 2023-09-27 NOTE — Op Note (Signed)
 Brief history: The patient is a 78 year old white male who is coming of back and bilateral leg pain consistent with neurogenic claudication.  He failed medical management and was worked up with lumbar x-rays and lumbar MRI which demonstrated an adult degenerative scoliosis, spondylolisthesis, spinal stenosis, etc.  I discussed the various treatment options with him.  He has decided to proceed with surgery.  Preoperative diagnosis: Adult degenerative scoliosis, lumbar facet arthropathy, lumbar spondylolisthesis, degenerative disc disease, spinal stenosis compressing L2, L3, L4 and L5; lumbago; lumbar radiculopathy; neurogenic claudication  Postoperative diagnosis: The same  Procedure: Bilateral L2-3 laminotomy/foraminotomies/medial facetectomy, L3-4 and L4-5 laminectomy/foraminotomies/medial facetectomies to decompress the bilateral L2, L3, L4 and L5 nerve roots(the work required to do this was in addition to the work required to do the posterior lumbar interbody fusion because of the patient's spinal stenosis, facet arthropathy. Etc. requiring a wide decompression of the nerve roots.); left L2-3, L3-4 and L4-5 transforaminal lumbar interbody fusion with local morselized autograft bone and Zimmer DBM; insertion of interbody prosthesis at L2-3, L3-4 and L4-5 (globus peek expandable interbody prosthesis); posterior segmental instrumentation from L2 to L5 with globus titanium pedicle screws and rods; posterior lateral arthrodesis at L2-3, L3-4 and L4-5 with local morselized autograft bone and Zimmer DBM.  Surgeon: Dr. Chyrl Budge  Asst.: Dr. Victory Gens  Anesthesia: Gen. endotracheal  Estimated blood loss: 800 cc  Drains: Medium Hemovac drain in the epidural space  Complications: None  Description of procedure: The patient was brought to the operating room by the anesthesia team. General endotracheal anesthesia was induced. The patient was turned to the prone position on the Wilson frame. The  patient's lumbosacral region was then prepared with Betadine  scrub and Betadine  solution. Sterile drapes were applied.  I then injected the area to be incised with Marcaine  with epinephrine  solution. I then used the scalpel to make a linear midline incision over the L2-3, L3-4 and L4-5 interspace. I then used electrocautery to perform a bilateral subperiosteal dissection exposing the spinous process and lamina of L2-3, L3-4 and L4-5. We then obtained intraoperative radiograph to confirm our location. We then inserted the Verstrac retractor to provide exposure.  I began the decompression by using the high speed drill to perform laminotomies at L2-3, L3-4 and L4-5 bilaterally. We then used the Kerrison punches to complete the laminectomy and L3-4 and L4-5 and to widen the laminotomies at L2-3 bilaterally and removed the ligamentum flavum at L2-3, L3-4 and L4-5. We used the Kerrison punches to remove the medial facets at L2-3, L3-4 and L4-5. We performed wide foraminotomies about the bilateral L2, L3, L4 and L5 nerve roots completing the decompression.  We now turned our attention to the posterior lumbar interbody fusion. I used a scalpel to incise the intervertebral disc at L2-3, L3-4 and L4-5 bilaterally. I then performed a partial intervertebral discectomy at L2-3, L3-4 and L4-5 bilaterally using the pituitary forceps. We prepared the vertebral endplates at L2-3, L3-4 and L4-5 bilaterally for the fusion by removing the soft tissues with the curettes. We then used the trial spacers to pick the appropriate sized interbody prosthesis. We prefilled his prosthesis with a combination of local morselized autograft bone that we obtained during the decompression as well as Zimmer DBM. We inserted the prefilled prosthesis into the interspace at L2-3, L3-4 and L4-5 from the left, we then expanded the prosthesis. There was a good snug fit of the prosthesis in the interspace. We then filled and the remainder of the  intervertebral disc space  with local morselized autograft bone and Zimmer DBM. This completed the posterior lumbar interbody arthrodesis.  During the decompression and insertion of the prosthesis the assistant protected the thecal sac and nerve roots with the D'Errico retractor.  We now turned attention to the instrumentation. Under fluoroscopic guidance we cannulated the bilateral L2, L3, L4 and L5 pedicles with the bone probe. We then removed the bone probe. We then tapped the pedicle with a 6.5 millimeter tap. We then removed the tap. We probed inside the tapped pedicle with a ball probe to rule out cortical breaches. We then inserted a 7.5 x 50 and 55 millimeter pedicle screw into the L2, L3, L4 and L5 pedicles bilaterally under fluoroscopic guidance. We then palpated along the medial aspect of the pedicles to rule out cortical breaches. There were none. The nerve roots were not injured. We then connected the unilateral pedicle screws with a lordotic rod. We compressed the construct and secured the rod in place with the caps. We then tightened the caps appropriately. This completed the instrumentation from L2-3, L3-4 and L4-5 bilaterally.  We now turned our attention to the posterior lateral arthrodesis at L2-3, 3 4 and L4-5. We used the high-speed drill to decorticate the remainder of the facets, pars, transverse process at L2-3, L3-4 and L4-5. We then applied a combination of local morselized autograft bone and Zimmer DBM over these decorticated posterior lateral structures. This completed the posterior lateral arthrodesis.  We then obtained hemostasis using bipolar electrocautery. We irrigated the wound out with vashe solution. We inspected the thecal sac and nerve roots and noted they were well decompressed. We then removed the retractor.  We injected Exparel  .  We placed a medium Hemovac drain in the epidural space and tunneled it out through a separate stab wound.  We reapproximated patient's  thoracolumbar fascia with interrupted #1 Vicryl suture. We reapproximated patient's subcutaneous tissue with interrupted 2-0 Vicryl suture. The reapproximated patient's skin with Steri-Strips and benzoin. The wound was then coated with bacitracin ointment. A sterile dressing was applied. The drapes were removed. The patient was subsequently returned to the supine position where they were extubated by the anesthesia team. He was then transported to the post anesthesia care unit in stable condition. All sponge instrument and needle counts were reportedly correct at the end of this case.

## 2023-09-27 NOTE — Transfer of Care (Signed)
 Immediate Anesthesia Transfer of Care Note  Patient: Corey Adams.  Procedure(s) Performed: POSTERIOR LUMBAR FUSION LUMBAR TWO-THREE, LUMBAR THREE-FOUR, LUMBAR FOUR-FIVE (Spine Lumbar)  Patient Location: PACU  Anesthesia Type:General  Level of Consciousness: awake and drowsy  Airway & Oxygen Therapy: Patient Spontanous Breathing and Patient connected to face mask oxygen  Post-op Assessment: Report given to RN and Post -op Vital signs reviewed and stable  Post vital signs: Reviewed and stable  Last Vitals:  Vitals Value Taken Time  BP 162/73 09/27/23 18:19  Temp 37 C 09/27/23 18:19  Pulse 105 09/27/23 18:21  Resp 12 09/27/23 18:20  SpO2 100 % 09/27/23 18:21  Vitals shown include unfiled device data.  Last Pain:  Vitals:   09/27/23 0843  PainSc: 2          Complications: No notable events documented.

## 2023-09-27 NOTE — Progress Notes (Signed)
 Orthopedic Tech Progress Note Patient Details:  Corey Adams 08-14-1945 [id number]  Ortho Devices Type of Ortho Device: Lumbar corsett Ortho Device/Splint Location: delivered to 3C06 per PACU RN Ortho Device/Splint Interventions: Ordered      Tinnie Ronal Brasil 09/27/2023, 8:28 PM

## 2023-09-27 NOTE — Anesthesia Procedure Notes (Signed)
 Procedure Name: Intubation Date/Time: 09/27/2023 10:59 AM  Performed by: Boyce Shilling, CRNAPre-anesthesia Checklist: Patient identified, Emergency Drugs available, Suction available, Timeout performed and Patient being monitored Patient Re-evaluated:Patient Re-evaluated prior to induction Oxygen Delivery Method: Circle system utilized Preoxygenation: Pre-oxygenation with 100% oxygen Induction Type: IV induction Ventilation: Mask ventilation without difficulty and Oral airway inserted - appropriate to patient size Laryngoscope Size: Mac and 4 Grade View: Grade I Tube type: Oral Tube size: 7.5 mm Airway Equipment and Method: Stylet Placement Confirmation: ETT inserted through vocal cords under direct vision, positive ETCO2, CO2 detector and breath sounds checked- equal and bilateral Secured at: 23 cm Tube secured with: Tape Dental Injury: Teeth and Oropharynx as per pre-operative assessment

## 2023-09-27 NOTE — H&P (Signed)
 Subjective: The patient is a 78 year old white male who has complained of back and right greater left leg pain consistent with neurogenic claudication.  He has failed medical management and was worked up with a lumbar MRI and lumbar x-rays which demonstrated a degenerative scoliosis, spinal stenosis, etc.  I discussed the various treatment options with him.  He has decided to proceed with surgery.  Past Medical History:  Diagnosis Date   Actinic keratitis    Alcohol abuse    history no drink since 2015   Anxiety    Arthritis    left thumb (03/05/2014)   Childhood asthma    Chronic lower back pain    CKD (chronic kidney disease), stage III (HCC)    Complication of anesthesia    difficulty urinating after anesthesia   Depression    Esophageal reflux    GERD (gastroesophageal reflux disease)    Hyperlipidemia    Hypertension    Kidney stones    plenty; no ORs (03/05/2014)   Memory change    Migraine    last one was in the 1990's (03/05/2014)   Multiple allergies    sinus stays swollen all the time   Pre-diabetes    Sleep apnea    have mask; don't use it (03/05/2014)    Past Surgical History:  Procedure Laterality Date   CARPAL TUNNEL RELEASE Bilateral 1990's   COLON SURGERY     DISTAL CLAVICLE EXCISION Right 03/05/2014   EXCISIONAL HEMORRHOIDECTOMY  1983   LAPAROSCOPIC LOW ANTERIOR RESECTION N/A 10/30/2012   Procedure: LAPAROSCOPIC LOW ANTERIOR RESECTION with Rigid Proctoscopy;  Surgeon: Deward GORMAN Curvin DOUGLAS, MD;  Location: [facility] OR;  Service: General;  Laterality: N/A;   NASAL SINUS SURGERY  1990's   TOTAL KNEE ARTHROPLASTY Left 08/05/2019   Procedure: LEFT TOTAL KNEE ARTHROPLASTY;  Surgeon: Sheril Coy, MD;  Location: [facility] ORS;  Service: Orthopedics;  Laterality: Left;   TOTAL SHOULDER ARTHROPLASTY Right 03/05/2014   Procedure: TOTAL SHOULDER ARTHROPLASTY;  Surgeon: Eva Elsie Herring, MD;  Location: [facility];  Service: Orthopedics;  Laterality: Right;  Right shoulder  replacement, distal clavicle excision   TOTAL SHOULDER ARTHROPLASTY Left 09/07/2016   TOTAL SHOULDER ARTHROPLASTY Left 09/07/2016   Procedure: TOTAL SHOULDER ARTHROPLASTY;  Surgeon: Herring Eva, MD;  Location: [facility] OR;  Service: Orthopedics;  Laterality: Left;  Left total shoulder arthroplasty   VASECTOMY      Allergies  Allergen Reactions   Shellfish Allergy Anaphylaxis   Guar Gum Hives   Xanthan Gum Hives   Codeine Nausea And Vomiting    Social History   Tobacco Use   Smoking status: Never   Smokeless tobacco: Never  Substance Use Topics   Alcohol use: Yes    Comment: recovering alcoholic as of 08/08/2013    Family History  Problem Relation Age of Onset   Heart attack Mother    Heart disease Father    Heart attack Father    Hypercholesterolemia Sister    Prior to Admission medications   Medication Sig Start Date End Date Taking? Authorizing Provider  acetaminophen  (TYLENOL ) 500 MG tablet Take 1,000 mg by mouth in the morning, at noon, and at bedtime.   Yes [provider]  atorvastatin  (LIPITOR) 10 MG tablet Take 10 mg by mouth at bedtime.    Yes [provider]  bisoprolol -hydrochlorothiazide  (ZIAC ) 2.5-6.25 MG tablet Take 1 tablet by mouth daily.   Yes [provider]  escitalopram  (LEXAPRO ) 10 MG tablet Take 10 mg by mouth daily.  Yes [provider]  HYDROcodone -acetaminophen  (NORCO/VICODIN) 5-325 MG tablet Take 1 tablet by mouth every 4 (four) hours as needed for severe pain (pain score 7-10). 08/15/23  Yes [provider]  loratadine  (CLARITIN ) 10 MG tablet Take 20 mg by mouth at bedtime.   Yes [provider]  omeprazole (PRILOSEC) 40 MG capsule Take 40 mg by mouth daily.  10/28/12  Yes [provider]  HYDROcodone -acetaminophen  (HYCET) 7.5-325 mg/15 ml solution Take 15 mLs by mouth 4 (four) times daily as needed for moderate pain (pain score 4-6). Patient not taking: Reported on 09/13/2023 07/27/23 07/26/24   Rosario Eland I, MD  lidocaine  (LIDODERM ) 5 % Place 1 patch onto the skin daily. Remove & Discard patch within 12 hours or as directed by MD Patient not taking: Reported on 09/13/2023 07/27/23   Rosario Eland I, MD     Review of Systems  Positive ROS: As above  All other systems have been reviewed and were otherwise negative with the exception of those mentioned in the HPI and as above.  Objective: Vital signs in last 24 hours: Temp:  [98.1 F (36.7 C)] 98.1 F (36.7 C) (06/26 0828) Pulse Rate:  [82] 82 (06/26 0828) Resp:  [18] 18 (06/26 0828) BP: (154)/(85) 154/85 (06/26 0828) SpO2:  [98 %] 98 % (06/26 0828) Weight:  [85.3 kg] 85.3 kg (06/26 0828) Estimated body mass index is 28.59 kg/m as calculated from the following:   Height as of this encounter: 5' 8 (1.727 m).   Weight as of this encounter: 85.3 kg.   General Appearance: Alert Head: Normocephalic, without obvious abnormality, atraumatic Eyes: PERRL, conjunctiva/corneas clear, EOM's intact,    Ears: Normal  Throat: Normal  Neck: Supple, Back: unremarkable Lungs: Clear to auscultation bilaterally, respirations unlabored Heart: Regular rate and rhythm, no murmur, rub or gallop Abdomen: Soft, non-tender Extremities: Extremities normal, atraumatic, no cyanosis or edema Skin: unremarkable  NEUROLOGIC:   Mental status: alert and oriented,Motor Exam - grossly normal Sensory Exam - grossly normal Reflexes:  Coordination - grossly normal Gait - grossly normal Balance - grossly normal Cranial Nerves: I: smell Not tested  II: visual acuity  OS: Normal  OD: Normal   II: visual fields Full to confrontation  II: pupils Equal, round, reactive to light  III,VII: ptosis None  III,IV,VI: extraocular muscles  Full ROM  V: mastication Normal  V: facial light touch sensation  Normal  V,VII: corneal reflex  Present  VII: facial muscle function - upper  Normal  VII: facial muscle function - lower Normal  VIII:  hearing Not tested  IX: soft palate elevation  Normal  IX,X: gag reflex Present  XI: trapezius strength  5/5  XI: sternocleidomastoid strength 5/5  XI: neck flexion strength  5/5  XII: tongue strength  Normal    Data Review Lab Results  Component Value Date   WBC 7.9 09/18/2023   HGB 14.5 09/18/2023   HCT 46.1 09/18/2023   MCV 92.8 09/18/2023   PLT 237 09/18/2023   Lab Results  Component Value Date   NA 139 09/18/2023   K 4.2 09/18/2023   CL 105 09/18/2023   CO2 24 09/18/2023   BUN 15 09/18/2023   CREATININE 1.35 (H) 09/18/2023   GLUCOSE 82 09/18/2023   Lab Results  Component Value Date   INR 0.9 07/24/2019    Assessment/Plan: Adult degenerative scoliosis, lumbar spondylosis, lumbar facet arthropathy, lumbar spinal stenosis, lumbar spondylolisthesis, neurogenic claudication, lumbar radiculopathy, lumbago: I have discussed the situation with  the patient.  I reviewed his imaging studies with him and pointed out the abnormalities.  We have discussed the various treatment options including surgery.  I have described the surgical treatment option of an L2-3, L3-4 and L4-5 decompression, instrumentation and fusion.  I have shown him surgical models.  I have given him a surgical pamphlet.  We have discussed the risk, benefits, alternatives, expected postoperative course and likelihood of achieving our goals with surgery.  I have answered all the patient's, and his wife's, questions.  He has decided to proceed with surgery.   Corey Adams Budge 09/27/2023 10:41 AM

## 2023-09-28 ENCOUNTER — Encounter (HOSPITAL_COMMUNITY): Payer: Self-pay | Admitting: Neurosurgery

## 2023-09-28 MED ORDER — TAMSULOSIN HCL 0.4 MG PO CAPS
0.4000 mg | ORAL_CAPSULE | Freq: Every day | ORAL | Status: DC
  Filled 2023-09-28: qty 1

## 2023-09-28 MED ORDER — TAMSULOSIN HCL 0.4 MG PO CAPS
0.4000 mg | ORAL_CAPSULE | Freq: Once | ORAL | Status: AC
  Administered 2023-09-28: 0.4 mg via ORAL
  Filled 2023-09-28: qty 1

## 2023-09-28 MED ORDER — TAMSULOSIN HCL 0.4 MG PO CAPS: 0.4000 mg | ORAL_CAPSULE | Freq: Every day | ORAL | Status: DC

## 2023-09-28 NOTE — Evaluation (Signed)
 Physical Therapy Evaluation  Patient Details Name: Corey Adams. MRN: [id number] DOB: 1945/10/31 Today's Date: 09/28/2023  History of Present Illness  Pt is a 78 y/o male who presents s/p L2-L5 PLIF on 09/27/2023. PMH significant for CKD III, HTN, cog deficit, migraine, pre-diabetes, B carpal tunnel release, distal clavicle excision, L TKA 2021, B TSA.  Clinical Impression  Pt admitted with above diagnosis. At the time of PT eval, pt was able to demonstrate transfers and ambulation with gross min assist to CGA and RW for support. Pt was educated on precautions, brace application/wearing schedule, appropriate activity progression, and car transfer. Pt currently with functional limitations due to the deficits listed below (see PT Problem List). Pt will benefit from skilled PT to increase their independence and safety with mobility to allow discharge to the venue listed below.          If plan is discharge home, recommend the following: A little help with walking and/or transfers;A little help with bathing/dressing/bathroom;Assistance with cooking/housework;Assist for transportation;Help with stairs or ramp for entrance   Can travel by private vehicle        Equipment Recommendations Rolling walker (2 wheels)  Recommendations for Other Services       Functional Status Assessment Patient has had a recent decline in their functional status and demonstrates the ability to make significant improvements in function in a reasonable and predictable amount of time.     Precautions / Restrictions Precautions Precautions: Fall;Back Precaution Booklet Issued: Yes (comment) Recall of Precautions/Restrictions: Impaired Precaution/Restrictions Comments: Reviewed handout and pt was cued for precautions during functional mobility. Required Braces or Orthoses: Spinal Brace Spinal Brace: Lumbar corset;Applied in sitting position Restrictions Weight Bearing Restrictions Per Provider Order: No       Mobility  Bed Mobility Overal bed mobility: Needs Assistance Bed Mobility: Rolling, Sidelying to Sit, Sit to Sidelying Rolling: Supervision Sidelying to sit: Supervision     Sit to sidelying: Supervision General bed mobility comments: VC's for log roll technique. HOB flat and rails lowered to simulate home environment.    Transfers Overall transfer level: Needs assistance Equipment used: Rolling walker (2 wheels) Transfers: Sit to/from Stand Sit to Stand: Contact guard assist           General transfer comment: Hands on guarding for safety as pt powered up to full stand.    Ambulation/Gait Ambulation/Gait assistance: Min assist Gait Distance (Feet): 200 Feet Assistive device: Rolling walker (2 wheels) Gait Pattern/deviations: Step-through pattern, Decreased stride length, Knees buckling, Trunk flexed, Antalgic Gait velocity: Decreased Gait velocity interpretation: 1.31 - 2.62 ft/sec, indicative of limited community ambulator   General Gait Details: Pt with soft knees and minor knee buckling throughout gait training. Occasional min assist provided for recovery and safety.  Stairs Stairs: Yes Stairs assistance: Contact guard assist Stair Management: Two rails, Step to pattern, Forwards Number of Stairs: 1 (x4) General stair comments: VC's for sequencing and general safety. Hands on guarding for safety.  Wheelchair Mobility     Tilt Bed    Modified Rankin (Stroke Patients Only)       Balance Overall balance assessment: Needs assistance Sitting-balance support: No upper extremity supported, Feet supported Sitting balance-Leahy Scale: Fair     Standing balance support: Bilateral upper extremity supported, No upper extremity supported, During functional activity Standing balance-Leahy Scale: Poor Standing balance comment: knees buckling in standing  Pertinent Vitals/Pain Pain Assessment Pain Assessment: Faces Faces  Pain Scale: Hurts little more Pain Location: back- incisional Pain Descriptors / Indicators: Discomfort, Operative site guarding Pain Intervention(s): Limited activity within patient's tolerance, Monitored during session, Repositioned    Home Living Family/patient expects to be discharged to:: Private residence Living Arrangements: Spouse/significant other Available Help at Discharge: Family Type of Home: House Home Access: Stairs to enter Entrance Stairs-Rails: Doctor, general practice of Steps: 4   Home Layout: One level Home Equipment: Cane - single Acupuncturist (4 wheels);Grab bars - tub/shower      Prior Function Prior Level of Function : Independent/Modified Independent             Mobility Comments: independent mobility, using walker recently ADLs Comments: indepedent ADLs, light IADLs     Extremity/Trunk Assessment   Upper Extremity Assessment Upper Extremity Assessment: Generalized weakness    Lower Extremity Assessment Lower Extremity Assessment: Generalized weakness    Cervical / Trunk Assessment Cervical / Trunk Assessment: Back Surgery  Communication   Communication Communication: No apparent difficulties    Cognition Arousal: Alert Behavior During Therapy: WFL for tasks assessed/performed   PT - Cognitive impairments: Memory                         Following commands: Intact       Cueing Cueing Techniques: Verbal cues, Gestural cues     General Comments      Exercises     Assessment/Plan    PT Assessment Patient needs continued PT services  PT Problem List Decreased strength;Decreased activity tolerance;Decreased balance;Decreased mobility;Decreased knowledge of use of DME;Decreased safety awareness;Decreased knowledge of precautions;Pain       PT Treatment Interventions DME instruction;Gait training;Stair training;Functional mobility training;Therapeutic activities;Therapeutic  exercise;Balance training;Patient/family education    PT Goals (Current goals can be found in the Care Plan section)  Acute Rehab PT Goals Patient Stated Goal: Home tomorrow PT Goal Formulation: With patient Time For Goal Achievement: 10/05/23 Potential to Achieve Goals: Good    Frequency Min 5X/week     Co-evaluation               AM-PAC PT 6 Clicks Mobility  Outcome Measure Help needed turning from your back to your side while in a flat bed without using bedrails?: A Little Help needed moving from lying on your back to sitting on the side of a flat bed without using bedrails?: A Little Help needed moving to and from a bed to a chair (including a wheelchair)?: A Little Help needed standing up from a chair using your arms (e.g., wheelchair or bedside chair)?: A Little Help needed to walk in hospital room?: A Little Help needed climbing 3-5 steps with a railing? : A Little 6 Click Score: 18    End of Session Equipment Utilized During Treatment: Gait belt;Back brace Activity Tolerance: Patient tolerated treatment well Patient left: in bed;with call bell/phone within reach Nurse Communication: Mobility status PT Visit Diagnosis: Unsteadiness on feet (R26.81);Pain Pain - part of body:  (back)    Time: 9159-9094 PT Time Calculation (min) (ACUTE ONLY): 25 min   Charges:   PT Evaluation $PT Eval Low Complexity: 1 Low PT Treatments $Gait Training: 8-22 mins PT General Charges $$ ACUTE PT VISIT: 1 Visit         Leita Sable, PT, DPT Acute Rehabilitation Services Secure Chat Preferred Office: [phone]   Leita JONETTA Sable 09/28/2023, 11:46 AM

## 2023-09-28 NOTE — Evaluation (Addendum)
 Occupational Therapy Evaluation Patient Details Name: Corey Adams. MRN: [id number] DOB: 08/03/1945 Today's Date: 09/28/2023   History of Present Illness   Pt is a 78 y/o male who presents s/p L2-L5 PLIF on 09/27/2023. PMH significant for scoliosis, spinal stenosis, arthritis, CKD, HTN, L TKR, bil TSA.      Clinical Impressions Patient admitted for above and presents with problem list below.  PTA pt was independent with ADLs but using walker recently. Patient was educated on brace mgmt and wear schedule, back precautions, ADL compensatory techniques, AE/DME, mobility progression, safety and recommendations.  Today, pt demonstrated ability to complete bed mobility with supervision, transfers using RW with contact guard assist, functional mobility using RW with contact guard assist, and ADLs with up to contact guard assist.  At discharge, pt will have support from spouse as needed. He does requires cueing to recall back precautions, but adheres functionally with minimal cueing. Discussed using 3:1 over commode at home to increase ease with transfers. Based on performance today, will follow acutely to optimize independence, safety with ADLs and IADLs but anticipate no further needs after dc home.       If plan is discharge home, recommend the following:   A little help with walking and/or transfers;A little help with bathing/dressing/bathroom;Assistance with cooking/housework;Assist for transportation;Help with stairs or ramp for entrance     Functional Status Assessment   Patient has had a recent decline in their functional status and demonstrates the ability to make significant improvements in function in a reasonable and predictable amount of time.     Equipment Recommendations   None recommended by OT     Recommendations for Other Services         Precautions/Restrictions   Precautions Precautions: Back;Fall Precaution Booklet Issued: Yes (comment) Recall of  Precautions/Restrictions: Intact Precaution/Restrictions Comments: pt does well adhering to precautions with min cueing but requires cueing to recall them (2/3 without cues); drain Required Braces or Orthoses: Spinal Brace Spinal Brace: Lumbar corset;Applied in sitting position Restrictions Weight Bearing Restrictions Per Provider Order: No     Mobility Bed Mobility Overal bed mobility: Needs Assistance Bed Mobility: Rolling, Sidelying to Sit, Sit to Sidelying Rolling: Supervision Sidelying to sit: Supervision     Sit to sidelying: Supervision General bed mobility comments: for technique, use of rails    Transfers Overall transfer level: Needs assistance Equipment used: Rolling walker (2 wheels) Transfers: Sit to/from Stand Sit to Stand: Contact guard assist           General transfer comment: cueing for hand placement and posture      Balance Overall balance assessment: Needs assistance Sitting-balance support: No upper extremity supported, Feet supported Sitting balance-Leahy Scale: Good     Standing balance support: Bilateral upper extremity supported, No upper extremity supported, During functional activity Standing balance-Leahy Scale: Fair Standing balance comment: preference to BUE support                           ADL either performed or assessed with clinical judgement   ADL Overall ADL's : Needs assistance/impaired     Grooming: Contact guard assist;Standing;Wash/dry hands           Upper Body Dressing : Minimal assistance;Sitting Upper Body Dressing Details (indicate cue type and reason): brace Lower Body Dressing: Contact guard assist;Sit to/from stand Lower Body Dressing Details (indicate cue type and reason): figure 4 technique, cueing for posture Toilet Transfer: Contact guard assist;Ambulation;Rolling walker (2  wheels)           Functional mobility during ADLs: Contact guard assist;Rolling walker (2 wheels);Cueing for  safety General ADL Comments: educated on back precautions and compensatory techniques     Vision   Vision Assessment?: No apparent visual deficits     Perception         Praxis         Pertinent Vitals/Pain Pain Assessment Pain Assessment: Faces Faces Pain Scale: Hurts little more Pain Location: back- incisional Pain Descriptors / Indicators: Discomfort, Operative site guarding Pain Intervention(s): Limited activity within patient's tolerance, Monitored during session, Repositioned     Extremity/Trunk Assessment Upper Extremity Assessment Upper Extremity Assessment: Generalized weakness   Lower Extremity Assessment Lower Extremity Assessment: Defer to PT evaluation   Cervical / Trunk Assessment Cervical / Trunk Assessment: Back Surgery   Communication Communication Communication: No apparent difficulties   Cognition Arousal: Alert Behavior During Therapy: WFL for tasks assessed/performed Cognition: No apparent impairments             OT - Cognition Comments: chart with hx of memory impairment, he does have difficulty recalling precautions but overall seems functional                 Following commands: Intact       Cueing  General Comments   Cueing Techniques: Verbal cues      Exercises     Shoulder Instructions      Home Living Family/patient expects to be discharged to:: Private residence Living Arrangements: Spouse/significant other Available Help at Discharge: Family Type of Home: House Home Access: Stairs to enter Secretary/administrator of Steps: 4 Entrance Stairs-Rails: Right;Left Home Layout: One level     Bathroom Shower/Tub: Walk-in Pensions consultant: Handicapped height Bathroom Accessibility: Yes   Home Equipment: Cane - single Acupuncturist (4 wheels);Grab bars - tub/shower          Prior Functioning/Environment Prior Level of Function : Independent/Modified Independent              Mobility Comments: independent mobility, using walker recently ADLs Comments: indepedent ADLs, light IADLs    OT Problem List: Decreased strength;Decreased activity tolerance;Impaired balance (sitting and/or standing);Pain;Decreased knowledge of precautions;Decreased knowledge of use of DME or AE   OT Treatment/Interventions: Self-care/ADL training;Therapeutic exercise;DME and/or AE instruction;Therapeutic activities;Balance training;Patient/family education      OT Goals(Current goals can be found in the care plan section)   Acute Rehab OT Goals Patient Stated Goal: home OT Goal Formulation: With patient Time For Goal Achievement: 10/12/23 Potential to Achieve Goals: Good   OT Frequency:  Min 2X/week    Co-evaluation              AM-PAC OT 6 Clicks Daily Activity     Outcome Measure Help from another person eating meals?: None Help from another person taking care of personal grooming?: A Little Help from another person toileting, which includes using toliet, bedpan, or urinal?: A Little Help from another person bathing (including washing, rinsing, drying)?: A Little Help from another person to put on and taking off regular upper body clothing?: A Little Help from another person to put on and taking off regular lower body clothing?: A Little 6 Click Score: 19   End of Session Equipment Utilized During Treatment: Rolling walker (2 wheels);Back brace Nurse Communication: Mobility status  Activity Tolerance: Patient tolerated treatment well Patient left: with call bell/phone within reach;in bed  OT Visit Diagnosis: Other abnormalities of gait  and mobility (R26.89);Muscle weakness (generalized) (M62.81);Pain Pain - part of body:  (back)                Time: 8961-8944 OT Time Calculation (min): 17 min Charges:  OT General Charges $OT Visit: 1 Visit OT Evaluation $OT Eval Low Complexity: 1 Low  Etta NOVAK, OT Acute Rehabilitation Services Office  [phone] Secure Chat Preferred    Etta GORMAN Hope 09/28/2023, 11:33 AM

## 2023-09-28 NOTE — Progress Notes (Signed)
 Subjective: The patient is alert and pleasant.  His back is sore but he feels better already.  He has not urinated since taking the catheter out.  Flomax has been started.  He wants to stay another day.  Objective: Vital signs in last 24 hours: Temp:  [98.1 F (36.7 C)-99.5 F (37.5 C)] 99.5 F (37.5 C) (06/27 0444) Pulse Rate:  [82-108] 95 (06/27 0444) Resp:  [8-18] 18 (06/27 0444) BP: (103-162)/(61-85) 113/61 (06/27 0444) SpO2:  [93 %-100 %] 98 % (06/27 0444) Arterial Line BP: (122-146)/(61-77) 122/61 (06/26 1900) Weight:  [85.3 kg] 85.3 kg (06/26 0828) Estimated body mass index is 28.59 kg/m as calculated from the following:   Height as of this encounter: 5' 8 (1.727 m).   Weight as of this encounter: 85.3 kg.   Intake/Output from previous day: 06/26 0701 - 06/27 0700 In: 4080 [P.O.:480; I.V.:2400; Blood:500; IV Piggyback:700] Out: 2720 [Urine:1345; Drains:375; Blood:1000] Intake/Output this shift: Total I/O In: 480 [P.O.:480] Out: 500 [Urine:125; Drains:375]  Physical exam the patient is alert and oriented.  He looks well.  His strength is normal.  His drain has put out 375 cc.  We will plan to take it out this afternoon or tomorrow.  Lab Results: No results for input(s): WBC, HGB, HCT, PLT in the last 72 hours. BMET No results for input(s): NA, K, CL, CO2, GLUCOSE, BUN, CREATININE, CALCIUM  in the last 72 hours.  Studies/Results: DG Lumbar Spine 2-3 Views Result Date: 09/27/2023 CLINICAL DATA:  Elective surgery EXAM: LUMBAR SPINE - 2-3 VIEW COMPARISON:  Lumbar spine x-ray 09/27/2023 FINDINGS: Two intraoperative fluoroscopic views of the lumbar spine. Four level lumbar fusion hardware present. Fluoroscopy time 51.6 seconds. Fluoroscopy dose 36.97 micro gray. IMPRESSION: Intraoperative fluoroscopic views of the lumbar spine. Electronically Signed   By: Greig Pique M.D.   On: 09/27/2023 21:24   DG C-Arm 1-60 Min-No Report Result Date:  09/27/2023 Fluoroscopy was utilized by the requesting physician.  No radiographic interpretation.   DG C-Arm 1-60 Min-No Report Result Date: 09/27/2023 Fluoroscopy was utilized by the requesting physician.  No radiographic interpretation.   DG Lumbar Spine 1 View Result Date: 09/27/2023 CLINICAL DATA:  Surgery. EXAM: LUMBAR SPINE - 1 VIEW COMPARISON:  Lumbar spine x-ray 08/15/2023 FINDINGS: Instrument is seen overlying the posterior elements at the L4-L5 level. 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 There is 7 mm of retrolisthesis at L2-L3. No acute fractures are seen. IMPRESSION: Instrument is seen overlying the posterior elements at the L4-L5 level. Electronically Signed   By: Greig Pique M.D.   On: 09/27/2023 19:21    Assessment/Plan: Postop day #1: The patient is doing well except for urinary retention.  If he does not urinate in the next hour or 2 we will BladderScan him.  LOS: 0 days     Corey Adams 09/28/2023, 6:36 AM     Patient ID: Corey Adams., male   DOB: Oct 12, 1945, 78 y.o.   MRN: [id number]

## 2023-09-28 NOTE — Anesthesia Postprocedure Evaluation (Signed)
 Anesthesia Post Note  Patient: Corey Adams.  Procedure(s) Performed: POSTERIOR LUMBAR FUSION LUMBAR TWO-THREE, LUMBAR THREE-FOUR, LUMBAR FOUR-FIVE (Spine Lumbar)     Patient location during evaluation: PACU Anesthesia Type: General Level of consciousness: awake and alert Pain management: pain level controlled Vital Signs Assessment: post-procedure vital signs reviewed and stable Respiratory status: spontaneous breathing, nonlabored ventilation, respiratory function stable and patient connected to nasal cannula oxygen Cardiovascular status: blood pressure returned to baseline and stable Postop Assessment: no apparent nausea or vomiting Anesthetic complications: no   No notable events documented.  Last Vitals:  Vitals:   09/28/23 0444 09/28/23 0722  BP: 113/61 115/78  Pulse: 95 98  Resp: 18 20  Temp: 37.5 C 36.9 C  SpO2: 98% 100%    Last Pain:  Vitals:   09/28/23 0722  TempSrc: Oral  PainSc:                  Nakshatra Klose S

## 2023-09-28 NOTE — Progress Notes (Signed)
-   s/p L2-L5 PLIF on 09/27/2023   09/28/23 1621  TOC Brief Assessment  Insurance and Status Reviewed  Patient has primary care physician Yes  Home environment has been reviewed From home with spouse, Landry [phone]).  Prior level of function: PTA independent with ADL's  Prior/Current Home Services No current home services  Social Drivers of Health Review SDOH reviewed no interventions necessary  Readmission risk has been reviewed No  Transition of care needs transition of care needs identified, TOC will continue to follow   PT evaluation/recommendation noted for home health PT. Pt agreeable to home health services. Pt without provider preference. Referral made with [facility] and accepted pending MD's order /face to face. Wife to assist with care once home. Pt with DME needs. States has RW @ home. Pt without RX med concerns. Or transportation issues. Post hospital f/u noted on AVS.

## 2023-09-29 ENCOUNTER — Emergency Department (HOSPITAL_COMMUNITY)

## 2023-09-29 ENCOUNTER — Inpatient Hospital Stay (HOSPITAL_COMMUNITY)
Admission: EM | Admit: 2023-09-29 | Discharge: 2023-10-04 | DRG: 854 | Disposition: A | Discharge status: Home Health | Attending: Neurosurgery | Admitting: Neurosurgery

## 2023-09-29 ENCOUNTER — Encounter (HOSPITAL_COMMUNITY): Payer: Self-pay

## 2023-09-29 ENCOUNTER — Other Ambulatory Visit: Payer: Self-pay

## 2023-09-29 DIAGNOSIS — Z96611 Presence of right artificial shoulder joint: Secondary | ICD-10-CM | POA: Diagnosis not present

## 2023-09-29 DIAGNOSIS — I700 Atherosclerosis of aorta: Secondary | ICD-10-CM | POA: Diagnosis not present

## 2023-09-29 DIAGNOSIS — N183 Chronic kidney disease, stage 3 unspecified: Secondary | ICD-10-CM | POA: Diagnosis present

## 2023-09-29 DIAGNOSIS — M4156 Other secondary scoliosis, lumbar region: Secondary | ICD-10-CM | POA: Diagnosis present

## 2023-09-29 DIAGNOSIS — R531 Weakness: Secondary | ICD-10-CM | POA: Diagnosis present

## 2023-09-29 DIAGNOSIS — R0989 Other specified symptoms and signs involving the circulatory and respiratory systems: Secondary | ICD-10-CM | POA: Diagnosis not present

## 2023-09-29 DIAGNOSIS — R339 Retention of urine, unspecified: Secondary | ICD-10-CM | POA: Diagnosis present

## 2023-09-29 DIAGNOSIS — M48062 Spinal stenosis, lumbar region with neurogenic claudication: Secondary | ICD-10-CM | POA: Diagnosis present

## 2023-09-29 DIAGNOSIS — K449 Diaphragmatic hernia without obstruction or gangrene: Secondary | ICD-10-CM | POA: Diagnosis not present

## 2023-09-29 DIAGNOSIS — Z043 Encounter for examination and observation following other accident: Secondary | ICD-10-CM | POA: Diagnosis not present

## 2023-09-29 DIAGNOSIS — S199XXA Unspecified injury of neck, initial encounter: Secondary | ICD-10-CM | POA: Diagnosis not present

## 2023-09-29 DIAGNOSIS — M5116 Intervertebral disc disorders with radiculopathy, lumbar region: Secondary | ICD-10-CM | POA: Diagnosis present

## 2023-09-29 DIAGNOSIS — R509 Fever, unspecified: Principal | ICD-10-CM | POA: Diagnosis present

## 2023-09-29 DIAGNOSIS — I6529 Occlusion and stenosis of unspecified carotid artery: Secondary | ICD-10-CM | POA: Diagnosis not present

## 2023-09-29 DIAGNOSIS — E785 Hyperlipidemia, unspecified: Secondary | ICD-10-CM | POA: Diagnosis present

## 2023-09-29 DIAGNOSIS — M4316 Spondylolisthesis, lumbar region: Secondary | ICD-10-CM | POA: Diagnosis present

## 2023-09-29 DIAGNOSIS — Z981 Arthrodesis status: Secondary | ICD-10-CM

## 2023-09-29 DIAGNOSIS — M4726 Other spondylosis with radiculopathy, lumbar region: Secondary | ICD-10-CM | POA: Diagnosis present

## 2023-09-29 DIAGNOSIS — Z1152 Encounter for screening for COVID-19: Secondary | ICD-10-CM | POA: Diagnosis not present

## 2023-09-29 DIAGNOSIS — R6510 Systemic inflammatory response syndrome (SIRS) of non-infectious origin without acute organ dysfunction: Secondary | ICD-10-CM | POA: Diagnosis present

## 2023-09-29 DIAGNOSIS — K5900 Constipation, unspecified: Secondary | ICD-10-CM | POA: Diagnosis not present

## 2023-09-29 DIAGNOSIS — R000 Tachycardia, unspecified: Secondary | ICD-10-CM | POA: Diagnosis present

## 2023-09-29 DIAGNOSIS — W19XXXA Unspecified fall, initial encounter: Secondary | ICD-10-CM | POA: Diagnosis not present

## 2023-09-29 DIAGNOSIS — S3992XA Unspecified injury of lower back, initial encounter: Secondary | ICD-10-CM | POA: Diagnosis not present

## 2023-09-29 DIAGNOSIS — M47812 Spondylosis without myelopathy or radiculopathy, cervical region: Secondary | ICD-10-CM | POA: Diagnosis not present

## 2023-09-29 DIAGNOSIS — Z96612 Presence of left artificial shoulder joint: Secondary | ICD-10-CM | POA: Diagnosis not present

## 2023-09-29 DIAGNOSIS — I129 Hypertensive chronic kidney disease with stage 1 through stage 4 chronic kidney disease, or unspecified chronic kidney disease: Secondary | ICD-10-CM | POA: Diagnosis present

## 2023-09-29 LAB — COMPREHENSIVE METABOLIC PANEL WITH GFR
ALT: 12 U/L (ref 0–44)
AST: 38 U/L (ref 15–41)
Albumin: 3.2 g/dL — ABNORMAL LOW (ref 3.5–5.0)
Alkaline Phosphatase: 34 U/L — ABNORMAL LOW (ref 38–126)
Anion gap: 10 (ref 5–15)
BUN: 15 mg/dL (ref 8–23)
CO2: 25 mmol/L (ref 22–32)
Calcium: 8.8 mg/dL — ABNORMAL LOW (ref 8.9–10.3)
Chloride: 102 mmol/L (ref 98–111)
Creatinine, Ser: 1.3 mg/dL — ABNORMAL HIGH (ref 0.61–1.24)
GFR, Estimated: 57 mL/min — ABNORMAL LOW (ref >60)
Glucose, Bld: 118 mg/dL — ABNORMAL HIGH (ref 70–99)
Potassium: 3.4 mmol/L — ABNORMAL LOW (ref 3.5–5.1)
Sodium: 137 mmol/L (ref 135–145)
Total Bilirubin: 0.8 mg/dL (ref 0.0–1.2)
Total Protein: 6 g/dL — ABNORMAL LOW (ref 6.5–8.1)

## 2023-09-29 LAB — URINALYSIS, ROUTINE W REFLEX MICROSCOPIC
Bacteria, UA: NONE SEEN
Bilirubin Urine: NEGATIVE
Glucose, UA: NEGATIVE mg/dL
Ketones, ur: NEGATIVE mg/dL
Leukocytes,Ua: NEGATIVE
Nitrite: NEGATIVE
Protein, ur: NEGATIVE mg/dL
RBC / HPF: >50 RBC/hpf (ref 0–5)
Specific Gravity, Urine: 1.009 (ref 1.005–1.030)
pH: 6 (ref 5.0–8.0)

## 2023-09-29 LAB — CBC WITH DIFFERENTIAL/PLATELET
Abs Immature Granulocytes: 0.04 10*3/uL (ref 0.00–0.07)
Basophils Absolute: 0 10*3/uL (ref 0.0–0.1)
Basophils Relative: 0 %
Eosinophils Absolute: 0 10*3/uL (ref 0.0–0.5)
Eosinophils Relative: 0 %
HCT: 29.9 % — ABNORMAL LOW (ref 39.0–52.0)
Hemoglobin: 9.5 g/dL — ABNORMAL LOW (ref 13.0–17.0)
Immature Granulocytes: 0 %
Lymphocytes Relative: 22 %
Lymphs Abs: 2 10*3/uL (ref 0.7–4.0)
MCH: 29.1 pg (ref 26.0–34.0)
MCHC: 31.8 g/dL (ref 30.0–36.0)
MCV: 91.7 fL (ref 80.0–100.0)
Monocytes Absolute: 1 10*3/uL (ref 0.1–1.0)
Monocytes Relative: 11 %
Neutro Abs: 6.1 10*3/uL (ref 1.7–7.7)
Neutrophils Relative %: 67 %
Platelets: 120 10*3/uL — ABNORMAL LOW (ref 150–400)
RBC: 3.26 MIL/uL — ABNORMAL LOW (ref 4.22–5.81)
RDW: 13.6 % (ref 11.5–15.5)
WBC: 9.1 10*3/uL (ref 4.0–10.5)
nRBC: 0 % (ref 0.0–0.2)

## 2023-09-29 LAB — RESP PANEL BY RT-PCR (RSV, FLU A&B, COVID)  RVPGX2
Influenza A by PCR: NEGATIVE
Influenza B by PCR: NEGATIVE
Resp Syncytial Virus by PCR: NEGATIVE
SARS Coronavirus 2 by RT PCR: NEGATIVE

## 2023-09-29 LAB — I-STAT CG4 LACTIC ACID, ED
Lactic Acid, Venous: 1.3 mmol/L (ref 0.5–1.9)
Lactic Acid, Venous: 1.1 mmol/L (ref 0.5–1.9)

## 2023-09-29 MED ORDER — HYDROCHLOROTHIAZIDE 12.5 MG PO TABS
6.2500 mg | ORAL_TABLET | Freq: Every day | ORAL | Status: DC
  Administered 2023-09-30 – 2023-10-04 (×5): 6.25 mg via ORAL
  Filled 2023-09-29 (×5): qty 1

## 2023-09-29 MED ORDER — SODIUM CHLORIDE 0.9 % IV BOLUS
1000.0000 mL | Freq: Once | INTRAVENOUS | Status: AC
  Administered 2023-09-29: 1000 mL via INTRAVENOUS

## 2023-09-29 MED ORDER — ESCITALOPRAM OXALATE 10 MG PO TABS
10.0000 mg | ORAL_TABLET | Freq: Every day | ORAL | Status: DC
  Administered 2023-09-30 – 2023-10-04 (×5): 10 mg via ORAL
  Filled 2023-09-29 (×5): qty 1

## 2023-09-29 MED ORDER — SODIUM CHLORIDE 0.9 % IV SOLN: INTRAVENOUS | Status: AC

## 2023-09-29 MED ORDER — CYCLOBENZAPRINE HCL 10 MG PO TABS: 10.0000 mg | ORAL_TABLET | Freq: Every evening | ORAL | Status: DC | PRN

## 2023-09-29 MED ORDER — LORATADINE 10 MG PO TABS: 10.0000 mg | ORAL_TABLET | Freq: Every day | ORAL | Status: DC | PRN

## 2023-09-29 MED ORDER — BISOPROLOL FUMARATE 5 MG PO TABS
2.5000 mg | ORAL_TABLET | Freq: Every day | ORAL | Status: DC
  Administered 2023-09-30 – 2023-10-04 (×5): 2.5 mg via ORAL
  Filled 2023-09-29 (×5): qty 0.5

## 2023-09-29 MED ORDER — ATORVASTATIN CALCIUM 10 MG PO TABS
10.0000 mg | ORAL_TABLET | Freq: Every day | ORAL | Status: DC
  Administered 2023-09-30 – 2023-10-04 (×4): 10 mg via ORAL
  Filled 2023-09-29 (×4): qty 1

## 2023-09-29 MED ORDER — OXYCODONE HCL 10 MG PO TABS: 10.0000 mg | ORAL_TABLET | Freq: Four times a day (QID) | ORAL | 0 refills | Status: DC | PRN

## 2023-09-29 MED ORDER — PANTOPRAZOLE SODIUM 40 MG PO TBEC
40.0000 mg | DELAYED_RELEASE_TABLET | Freq: Every day | ORAL | Status: DC
  Administered 2023-09-30 – 2023-10-04 (×5): 40 mg via ORAL
  Filled 2023-09-29 (×5): qty 1

## 2023-09-29 MED ORDER — OXYCODONE HCL 5 MG PO TABS
10.0000 mg | ORAL_TABLET | Freq: Four times a day (QID) | ORAL | Status: DC | PRN
  Administered 2023-09-30 – 2023-10-04 (×6): 10 mg via ORAL
  Filled 2023-09-29 (×7): qty 2

## 2023-09-29 MED ORDER — CYCLOBENZAPRINE HCL 10 MG PO TABS: 10.0000 mg | ORAL_TABLET | Freq: Every evening | ORAL | 0 refills | Status: AC | PRN

## 2023-09-29 MED ORDER — ACETAMINOPHEN 325 MG PO TABS
975.0000 mg | ORAL_TABLET | Freq: Once | ORAL | Status: AC
  Administered 2023-09-29: 975 mg via ORAL
  Filled 2023-09-29: qty 3

## 2023-09-29 MED ORDER — ACETAMINOPHEN 500 MG PO TABS
500.0000 mg | ORAL_TABLET | Freq: Four times a day (QID) | ORAL | Status: DC | PRN
  Administered 2023-09-30 – 2023-10-04 (×11): 500 mg via ORAL
  Filled 2023-09-29 (×11): qty 1

## 2023-09-29 MED ORDER — BISOPROLOL-HYDROCHLOROTHIAZIDE 2.5-6.25 MG PO TABS: 1.0000 | ORAL_TABLET | Freq: Every day | ORAL | Status: DC

## 2023-09-29 NOTE — ED Provider Notes (Signed)
 On my evaluation the patient is in no distress, is urinating.  He remains febrile, tachycardic.  I discussed his case with his neurosurgery team.  Though x-ray is reassuring, COVID panel is negative, he has no lactic acidosis with fever, tachycardia meet SIRS criteria will be admitted to our neurosurgery colleagues for ongoing monitoring management.   Garrick Charleston, MD 09/29/23 2236

## 2023-09-29 NOTE — Progress Notes (Signed)
 Occupational Therapy Treatment Patient Details Name: Corey Adams. MRN: [id number] DOB: Oct 23, 1945 Today's Date: 09/29/2023   History of present illness Pt is a 78 y/o male who presents s/p L2-L5 PLIF on 09/27/2023. PMH significant for CKD III, HTN, cog deficit, migraine, pre-diabetes, B carpal tunnel release, distal clavicle excision, L TKA 2021, B TSA.   OT comments  Pt progressing well towards goals. Pt with good recall of  compensatory strategies for ADLs to adhere to back precautions. While standing at the sink pt, requiring contact guard to min assist to steady with posterior lean and LOB. Educated pt on safety within the home and supervision with mobility upon d/c. Will continue to follow acutely.      If plan is discharge home, recommend the following:  A little help with walking and/or transfers;A little help with bathing/dressing/bathroom;Assistance with cooking/housework;Assist for transportation;Help with stairs or ramp for entrance   Equipment Recommendations  None recommended by OT    Recommendations for Other Services      Precautions / Restrictions Precautions Precautions: Fall;Back Precaution Booklet Issued: Yes (comment) Recall of Precautions/Restrictions: Impaired Precaution/Restrictions Comments: Pt can recall 2/3 precautions (forgot twisting) Required Braces or Orthoses: Spinal Brace Spinal Brace: Lumbar corset;Applied in sitting position Restrictions Weight Bearing Restrictions Per Provider Order: No       Mobility Bed Mobility   General bed mobility comments: Received in recliner    Transfers Overall transfer level: Needs assistance Equipment used: Rolling walker (2 wheels) Transfers: Sit to/from Stand, Bed to chair/wheelchair/BSC Sit to Stand: Contact guard assist     Step pivot transfers: Contact guard assist     General transfer comment: CGA for balance, can be min assist for extended standing periods     Balance Overall balance  assessment: Needs assistance Sitting-balance support: No upper extremity supported, Feet supported Sitting balance-Leahy Scale: Fair   Postural control: Posterior lean Standing balance support: Bilateral upper extremity supported, No upper extremity supported, During functional activity Standing balance-Leahy Scale: Poor Standing balance comment: knees buckling in standing, posterior lean or LOB       ADL either performed or assessed with clinical judgement   ADL Overall ADL's : Needs assistance/impaired     Grooming: Wash/dry face;Wash/dry hands;Contact guard assist;Minimal assistance;Standing Grooming Details (indicate cue type and reason): Varying contact guard to min assist with posterior lean     Lower Body Dressing: Minimal assistance;Sit to/from stand Lower Body Dressing Details (indicate cue type and reason): Pt with difficulty figure 4 legs, and adhering to precautions Toilet Transfer: Contact guard assist;Ambulation;Rolling walker (2 wheels);Minimal assistance Toilet Transfer Details (indicate cue type and reason): Min assist for balance with navigating small doorways Toileting- Clothing Manipulation and Hygiene: Contact guard assist;Sit to/from stand Toileting - Clothing Manipulation Details (indicate cue type and reason): Reviewed compensatory strategies for perihygiene     Functional mobility during ADLs: Contact guard assist;Rolling walker (2 wheels);Cueing for safety General ADL Comments: Reviewed back precautions and compensatory strategies    Extremity/Trunk Assessment Upper Extremity Assessment Upper Extremity Assessment: Generalized weakness   Lower Extremity Assessment Lower Extremity Assessment: Defer to PT evaluation        Vision   Vision Assessment?: No apparent visual deficits         Communication Communication Communication: No apparent difficulties   Cognition Arousal: Alert Behavior During Therapy: WFL for tasks  assessed/performed Cognition: No apparent impairments             OT - Cognition Comments: chart with hx of memory  impairment, able to recall 2/3 precautions and  compensatory strategies   Following commands: Intact        Cueing   Cueing Techniques: Verbal cues, Gestural cues        General Comments Educated pt on safety within the home to reduce fall risk    Pertinent Vitals/ Pain       Pain Assessment Pain Assessment: Faces Faces Pain Scale: Hurts even more Pain Location: back- incisional Pain Descriptors / Indicators: Discomfort, Operative site guarding Pain Intervention(s): Monitored during session   Frequency  Min 2X/week        Progress Toward Goals  OT Goals(current goals can now be found in the care plan section)  Progress towards OT goals: Progressing toward goals  Acute Rehab OT Goals Patient Stated Goal: To go home OT Goal Formulation: With patient Time For Goal Achievement: 10/12/23 Potential to Achieve Goals: Good ADL Goals Pt Will Perform Grooming: with modified independence;standing Pt Will Perform Lower Body Dressing: with modified independence;sit to/from stand Pt Will Transfer to Toilet: with modified independence;ambulating;bedside commode Pt Will Perform Toileting - Clothing Manipulation and hygiene: with modified independence;sitting/lateral leans;sit to/from stand Pt Will Perform Tub/Shower Transfer: Shower transfer;with modified independence;ambulating;shower seat;grab bars;rolling walker  Plan         AM-PAC OT 6 Clicks Daily Activity     Outcome Measure   Help from another person eating meals?: None Help from another person taking care of personal grooming?: A Little Help from another person toileting, which includes using toliet, bedpan, or urinal?: A Little Help from another person bathing (including washing, rinsing, drying)?: A Little Help from another person to put on and taking off regular upper body clothing?: A  Little Help from another person to put on and taking off regular lower body clothing?: A Little 6 Click Score: 19    End of Session Equipment Utilized During Treatment: Gait belt;Rolling walker (2 wheels);Back brace  OT Visit Diagnosis: Other abnormalities of gait and mobility (R26.89);Muscle weakness (generalized) (M62.81);Pain   Activity Tolerance Patient tolerated treatment well   Patient Left with call bell/phone within reach;in chair   Nurse Communication Mobility status        Time: 9175-9158 OT Time Calculation (min): 17 min  Charges: OT General Charges $OT Visit: 1 Visit OT Treatments $Self Care/Home Management : 8-22 mins  Adrianne BROCKS, OT  Acute Rehabilitation Services Office [phone] Secure chat preferred   Adrianne GORMAN Sa[place] 09/29/2023, 9:15 AM

## 2023-09-29 NOTE — ED Provider Notes (Signed)
 [place] [facility]GENC[facility] DEPARTMENT AT [facility] Provider Note   CSN: [id number] Arrival date & time: 09/29/23  1435     Patient presents with: Corey Adams Corey Adams. is a 78 y.o. male.  With a history of status post lumbar decompression (626, Dr. Mavis) who presents to the ED after fall.  Patient was discharged this morning following admission after lumbar decompression.  He was evaluated by PT and case management discharged home with services.  Was walking with his walker today when his legs were too weak and he fell backwards while being guided to the ground by his wife.  Now with some neck and lower back pain.  No head trauma or loss of consciousness.  Denies chest pain shortness of breath pain in other extremities.  Wife voiced concern for safety at home due to troubles with balance and significant weakness while ambulating with walker    Fall       Prior to Admission medications   Medication Sig Start Date End Date Taking? Authorizing Provider  acetaminophen  (TYLENOL ) 500 MG tablet Take 1,000 mg by mouth in the morning, at noon, and at bedtime.    [provider]  atorvastatin  (LIPITOR) 10 MG tablet Take 10 mg by mouth at bedtime.     [provider]  bisoprolol -hydrochlorothiazide  (ZIAC ) 2.5-6.25 MG tablet Take 1 tablet by mouth daily.    [provider]  cyclobenzaprine  (FLEXERIL ) 10 MG tablet Take 1 tablet (10 mg total) by mouth at bedtime as needed for muscle spasms. 09/29/23   Lanis Pupa, MD  escitalopram  (LEXAPRO ) 10 MG tablet Take 10 mg by mouth daily.    [provider]  lidocaine  (LIDODERM ) 5 % Place 1 patch onto the skin daily. Remove & Discard patch within 12 hours or as directed by MD Patient not taking: Reported on 09/13/2023 07/27/23   Rosario Eland I, MD  loratadine  (CLARITIN ) 10 MG tablet Take 20 mg by mouth at bedtime.    [provider]  omeprazole (PRILOSEC) 40 MG capsule Take 40 mg by mouth  daily.  10/28/12   [provider]  oxyCODONE  10 MG TABS Take 1 tablet (10 mg total) by mouth every 6 (six) hours as needed for up to 7 days for severe pain (pain score 7-10). 09/29/23 10/06/23  Lanis Pupa, MD    Allergies: Shellfish allergy, Guar gum, Xanthan gum, and Codeine    Review of Systems  Updated Vital Signs BP 126/76   Pulse (!) 112   Temp (!) 101.1 F (38.4 C) (Oral)   Resp 10   Ht 5' 8 (1.727 m)   Wt 85.3 kg   SpO2 98%   BMI 28.59 kg/m   Physical Exam Vitals and nursing note reviewed.  HENT:     Head: Normocephalic and atraumatic.   Eyes:     Pupils: Pupils are equal, round, and reactive to light.   Neck:     Comments: Left paraspinal tenderness no midline tenderness step-off deformity Cardiovascular:     Rate and Rhythm: Normal rate and regular rhythm.  Pulmonary:     Effort: Pulmonary effort is normal.     Breath sounds: Normal breath sounds.  Abdominal:     Palpations: Abdomen is soft.     Tenderness: There is no abdominal tenderness.   Musculoskeletal:     Cervical back: Neck supple. Tenderness present.     Comments: Midline tenderness of lumbar spine with postoperative bandage in place No active bleeding  No step-off deformity   Skin:    General: Skin is warm and dry.   Neurological:     General: No focal deficit present.     Mental Status: He is alert and oriented to person, place, and time.     Sensory: No sensory deficit.     Motor: No weakness.   Psychiatric:        Mood and Affect: Mood normal.     (all labs ordered are listed, but only abnormal results are displayed) Labs Reviewed  COMPREHENSIVE METABOLIC PANEL WITH GFR - Abnormal; Notable for the following components:      Result Value   Potassium 3.4 (*)    Glucose, Bld 118 (*)    Creatinine, Ser 1.30 (*)    Calcium  8.8 (*)    Total Protein 6.0 (*)    Albumin  3.2 (*)    Alkaline Phosphatase 34 (*)    GFR, Estimated 57 (*)    All other components within  normal limits  CBC WITH DIFFERENTIAL/PLATELET - Abnormal; Notable for the following components:   RBC 3.26 (*)    Hemoglobin 9.5 (*)    HCT 29.9 (*)    Platelets 120 (*)    All other components within normal limits  RESP PANEL BY RT-PCR (RSV, FLU A&B, COVID)  RVPGX2  URINALYSIS, ROUTINE W REFLEX MICROSCOPIC  I-STAT CG4 LACTIC ACID, ED  I-STAT CG4 LACTIC ACID, ED    EKG: EKG Interpretation Date/Time:  Saturday September 29 2023 15:43:18 EDT Ventricular Rate:  105 PR Interval:  163 QRS Duration:  97 QT Interval:  321 QTC Calculation: 425 R Axis:   62  Text Interpretation: Sinus tachycardia Confirmed by Pamella Sharper [phone]) on 09/29/2023 8:23:13 PM  Radiology: CT Lumbar Spine Wo Contrast Result Date: 09/29/2023 CLINICAL DATA:  Back trauma after fall today. Status post lumbar decompression 6/26 EXAM: CT LUMBAR SPINE WITHOUT CONTRAST TECHNIQUE: Multidetector CT imaging of the lumbar spine was performed without intravenous contrast administration. Multiplanar CT image reconstructions were also generated. RADIATION DOSE REDUCTION: This exam was performed according to the departmental dose-optimization program which includes automated exposure control, adjustment of the mA and/or kV according to patient size and/or use of iterative reconstruction technique. COMPARISON:  Radiograph 09/27/2023 FINDINGS: Segmentation: 5 lumbar type vertebrae. Alignment: No evidence of traumatic listhesis. Unchanged grade 1 retrolisthesis of L1. Vertebrae: No acute fracture. Paraspinal and other soft tissues: Moderate right greater than left hydroureteronephrosis. Partially visualized spiculated stone measuring 2.1 cm in the posterior right bladder near the UVJ. 3 mm stone in the distal left ureter. Additional bilateral nonobstructing nephrolithiasis. Moderate hiatal hernia. Aortic atherosclerotic calcification. Disc levels: Posterior fusion and decompression L2-L5. Interbody spacers at L2-L3, L3-L4, and L4-L5. No  evidence of hardware failure loosening. Ill-defined fluid and locules of gas about the surgical bed is presumed postoperative from operation on 09/27/2023. IMPRESSION: 1. No acute fracture or traumatic listhesis in the lumbar spine. 2. Posterior fusion and decompression L2-L5. Ill-defined fluid and locules of gas about the surgical bed is presumed postoperative from operation on 09/27/2023. 3. Moderate right greater than left hydroureteronephrosis. Partially visualized spiculated stone measuring 2.1 cm in the posterior right bladder near the UVJ. 3 mm stone in the distal left ureter. 4. Moderate hiatal hernia. 5. Aortic Atherosclerosis (ICD10-I70.0). Electronically Signed   By: Norman Gatlin M.D.   On: 09/29/2023 17:33   CT Cervical Spine Wo Contrast Result Date: 09/29/2023 CLINICAL DATA:  Neck trauma EXAM: CT CERVICAL SPINE WITHOUT CONTRAST TECHNIQUE: Multidetector CT imaging  of the cervical spine was performed without intravenous contrast. Multiplanar CT image reconstructions were also generated. RADIATION DOSE REDUCTION: This exam was performed according to the departmental dose-optimization program which includes automated exposure control, adjustment of the mA and/or kV according to patient size and/or use of iterative reconstruction technique. COMPARISON:  None are available FINDINGS: Alignment: No evidence of traumatic listhesis. Skull base and vertebrae: No acute fracture. Soft tissues and spinal canal: No prevertebral fluid or swelling. No visible canal hematoma. Disc levels: Multilevel spondylosis, disc space height loss, and degenerative endplate changes greatest at C5-C6 and C6-C7 where it is moderate to advanced. Posterior disc osteophyte complexes at C4-C5, C5-C6, and C6-C7 cause mild to moderate effacement of the ventral thecal sac worst at C5-C6. No severe spinal canal narrowing. Upper chest: No acute abnormality. Other: Carotid calcification. IMPRESSION: No evidence of acute fracture or  traumatic listhesis. Electronically Signed   By: Norman Gatlin M.D.   On: 09/29/2023 17:23   DG Chest Portable 1 View Result Date: 09/29/2023 CLINICAL DATA:  Fall.  Question pneumonia. EXAM: PORTABLE CHEST 1 VIEW COMPARISON:  Chest radiograph 07/24/2023 FINDINGS: Lower lung volumes from prior exam. Stable heart size and mediastinal contours. No focal airspace disease. No pulmonary edema. No pleural effusion or pneumothorax. Bilateral shoulder arthroplasty IMPRESSION: Low lung volumes without acute chest finding. Electronically Signed   By: Andrea Gasman M.D.   On: 09/29/2023 16:23     Procedures   Medications Ordered in the ED  sodium chloride  0.9 % bolus 1,000 mL (0 mLs Intravenous Stopped 09/29/23 2015)  acetaminophen  (TYLENOL ) tablet 975 mg (975 mg Oral Given 09/29/23 2120)    Clinical Course as of 09/29/23 2133  Sat Sep 29, 2023  2131 CT C-spine lumbar spine showed no evidence of traumatic findings.  Postoperative changes.  Chest x-ray shows no evidence of pneumonia.  No leukocytosis but patient did have temp of 101 here has been tachycardic.  Awaiting UA.  Based on UA results may require readmission to medicine versus keeping him here for [facility] evaluation for likely rehab placement.  LILLETTE Ozell Marine DO, am transitioning care of this patient to the oncoming provider pending UA reevaluation disposition [MP]    Clinical Course User Index [MP] Marine Ozell LABOR, DO                                 Medical Decision Making 78 year old male with history as above presented to the ED after a fall at home following discharge after lumbar decompression on June 26 with Dr. Mavis here.  Fell backward while being guided to the ground.  Now with back pain left-sided neck pain.  No midline tenderness.  Given concern for ongoing postoperative weakness will obtain laboratory including CBC metabolic panel EKG along with chest x-ray CT C-spine CT L-spine to look for traumatic injury.  Minimum suspect PT  case management eval would be required again given significant weakness and fall risk at home  Amount and/or Complexity of Data Reviewed Labs: ordered. Radiology: ordered.  Risk OTC drugs.        Final diagnoses:  Fall, initial encounter    ED Discharge Orders     None          Marine Ozell LABOR, DO 09/29/23 2133

## 2023-09-29 NOTE — TOC Transition Note (Signed)
 Transition of Care [facility]) - Discharge Note   Patient Details  Name: Corey Adams. MRN: [id number] Date of Birth: [birth year]  Transition of Care [facility]) CM/SW Contact:  Marval Gell, RN Phone Number: 09/29/2023, 9:13 AM   Clinical Narrative:      Patient with order to DC to home today. Unit staff to provide DME needed for home.   Patient set up w [facility].   Liaison for agency has been notified of DC. Information added to AVS  Patient will have family/ friends provide transportation home. No other TOC needs identified for DC      Discharge Plan and Services Additional resources added to the After Visit Summary for                                       Social Drivers of Health (SDOH) Interventions SDOH Screenings   Food Insecurity: No Food Insecurity (07/26/2023)  Housing: Low Risk  (07/26/2023)  Transportation Needs: No Transportation Needs (07/26/2023)  Utilities: Low Risk  (02/20/2023)   Received from [facility]  Tobacco Use: Low Risk  (09/27/2023)     Readmission Risk Interventions    07/27/2023   12:29 PM 07/26/2023   12:52 PM  Readmission Risk Prevention Plan  Post Dischage Appt Complete Complete  Medication Screening Complete Complete  Transportation Screening Complete Complete

## 2023-09-29 NOTE — Discharge Summary (Signed)
 Physician Discharge Summary  Patient ID: Corey Adams. MRN: [id number] DOB/AGE: [birth year] 76 y.o.  Admit date: 09/27/2023 Discharge date: 09/29/2023  Admission Diagnoses:  Degenerative scoliosis Lumbar spondylosis  Discharge Diagnoses:  Same Principal Problem:   Degenerative scoliosis in adult patient   Discharged Condition: Stable  Hospital Course:  [place] Passage. is a 78 y.o. male who underwent uncomplicated lumbar decompression/fusion. He was at baseline postoperatively, ambulating well, tolerating diet, voiding normally and requested d/c home.  Treatments: Surgery - L2-5 PLIF  Discharge Exam: Blood pressure 125/78, pulse (!) 110, temperature 98.9 F (37.2 C), resp. rate 20, height 5' 8 (1.727 m), weight 85.3 kg, SpO2 96%. Awake, alert, oriented Speech fluent, appropriate CN grossly intact 5/5 BUE/BLE Wound c/d/i  Disposition: Discharge disposition: 01-Home or Self Care       Discharge Instructions     Call MD for:  redness, tenderness, or signs of infection (pain, swelling, redness, odor or green/yellow discharge around incision site)   Complete by: As directed    Call MD for:  temperature >100.4   Complete by: As directed    Diet - low sodium heart healthy   Complete by: As directed    Discharge instructions   Complete by: As directed    Walk at home as much as possible, at least 4 times / day   Increase activity slowly   Complete by: As directed    Lifting restrictions   Complete by: As directed    No lifting > 10 lbs   May shower / Bathe   Complete by: As directed    48 hours after surgery   May walk up steps   Complete by: As directed    Other Restrictions   Complete by: As directed    No bending/twisting at waist   Remove dressing in 24 hours   Complete by: As directed       Allergies as of 09/29/2023       Reactions   Shellfish Allergy Anaphylaxis   Guar Gum Hives   Xanthan Gum Hives   Codeine Nausea And Vomiting         Medication List     STOP taking these medications    HYDROcodone -acetaminophen  5-325 MG tablet Commonly known as: NORCO/VICODIN   HYDROcodone -acetaminophen  7.5-325 mg/15 ml solution Commonly known as: HYCET       TAKE these medications    acetaminophen  500 MG tablet Commonly known as: TYLENOL  Take 1,000 mg by mouth in the morning, at noon, and at bedtime.   atorvastatin  10 MG tablet Commonly known as: LIPITOR Take 10 mg by mouth at bedtime.   bisoprolol -hydrochlorothiazide  2.5-6.25 MG tablet Commonly known as: ZIAC  Take 1 tablet by mouth daily.   cyclobenzaprine  10 MG tablet Commonly known as: FLEXERIL  Take 1 tablet (10 mg total) by mouth at bedtime as needed for muscle spasms.   escitalopram  10 MG tablet Commonly known as: LEXAPRO  Take 10 mg by mouth daily.   lidocaine  5 % Commonly known as: LIDODERM  Place 1 patch onto the skin daily. Remove & Discard patch within 12 hours or as directed by MD   loratadine  10 MG tablet Commonly known as: CLARITIN  Take 20 mg by mouth at bedtime.   omeprazole 40 MG capsule Commonly known as: PRILOSEC Take 40 mg by mouth daily.   Oxycodone  HCl 10 MG Tabs Take 1 tablet (10 mg total) by mouth every 6 (six) hours as needed for up to 7 days for severe pain (pain  score 7-10).        Follow-up Information     Sun, Vyvyan, MD Follow up.   Specialty: Family Medicine Contact information: [address], [address] [place] [address] [phone]         [facility] Follow up.   Specialty: Neurosurgery Contact information: [address] [place] [address] [place] [address] [phone]                 Signed: Gerldine JAYSON Maizes 09/29/2023, 8:59 AM

## 2023-09-29 NOTE — Progress Notes (Signed)
 Patient alert and oriented, void, ambulate, surgical site clean and dry. Patient took flexeril  last night the make pt. Weak. Ambulate with PT. With RW. Patient and Wife comfortable going patient home. HH set up . D/c instructions explain and given all questions answered.

## 2023-09-29 NOTE — Progress Notes (Signed)
 Physical Therapy Treatment Patient Details Name: Corey Adams. MRN: [id number] DOB: 1945-05-27 Today's Date: 09/29/2023   History of Present Illness Pt is a 78 y/o male who presents s/p L2-L5 PLIF on 09/27/2023. PMH significant for CKD III, HTN, cog deficit, migraine, pre-diabetes, B carpal tunnel release, distal clavicle excision, L TKA 2021, B TSA.    PT Comments  Reviewed stair navigation techniques similar to home set-up. Required CGA for safety, will have wife guard at d/c. Effortful with bed mobility but no physical assist required to rise to EOB today. Min assist for sit to stand transfer due to weakness and posterior LOB. CGA with gait demonstrating intermittent LE buckling but supporting self with RW. Relayed concerns of bil LEs buckling to nursing staff. Will follow and progress during acute stay. HHPT recommended.    If plan is discharge home, recommend the following: A little help with walking and/or transfers;A little help with bathing/dressing/bathroom;Assistance with cooking/housework;Assist for transportation;Help with stairs or ramp for entrance   Can travel by private vehicle        Equipment Recommendations  Rolling walker (2 wheels) (If he does not have one (two different reports))    Recommendations for Other Services       Precautions / Restrictions Precautions Precautions: Fall;Back Precaution Booklet Issued: Yes (comment) Recall of Precautions/Restrictions: Impaired Precaution/Restrictions Comments: reviewed Required Braces or Orthoses: Spinal Brace Spinal Brace: Lumbar corset;Applied in sitting position Restrictions Weight Bearing Restrictions Per Provider Order: No     Mobility  Bed Mobility Overal bed mobility: Needs Assistance Bed Mobility: Rolling, Sidelying to Sit Rolling: Supervision Sidelying to sit: Supervision       General bed mobility comments: Supervision for safety with cues for technique. Extra time, effortful but completed without  physical assist and maintains back precautions.    Transfers Overall transfer level: Needs assistance Equipment used: Rolling walker (2 wheels) Transfers: Sit to/from Stand Sit to Stand: Min assist           General transfer comment: Min assist for balance, bracing heavily against bed. posterior instability. Gradully improved with cues. RW to stabilize.    Ambulation/Gait Ambulation/Gait assistance: Contact guard assist Gait Distance (Feet): 100 Feet Assistive device: Rolling walker (2 wheels) Gait Pattern/deviations: Step-through pattern, Decreased stride length, Knees buckling, Trunk flexed, Antalgic Gait velocity: Decreased Gait velocity interpretation: <1.31 ft/sec, indicative of household ambulator   General Gait Details: Intermittent knee buckling but able to self correct. CGA for safety with cues for closer proximity to device to maximize UE leverage and safety.   Stairs Stairs: Yes Stairs assistance: Contact guard assist Stair Management: Step to pattern, One rail Right, Sideways Number of Stairs: 4 General stair comments: Educated on safe stair navigation techniques and sequencing with set-up similar to home environment. rails wide at home so pt will use bil hands to rail on Rt to climb. LEs with buckling but able to stablize himself with CGA for safety. Educated pt to have wife guard from behind when ascending, and in front when descending.   Wheelchair Mobility     Tilt Bed    Modified Rankin (Stroke Patients Only)       Balance Overall balance assessment: Needs assistance Sitting-balance support: No upper extremity supported, Feet supported Sitting balance-Leahy Scale: Fair     Standing balance support: Bilateral upper extremity supported, During functional activity Standing balance-Leahy Scale: Poor Standing balance comment: knees buckling in standing  Communication Communication Communication: No apparent  difficulties  Cognition Arousal: Alert Behavior During Therapy: WFL for tasks assessed/performed   PT - Cognitive impairments: No family/caregiver present to determine baseline                         Following commands: Intact      Cueing Cueing Techniques: Verbal cues  Exercises      General Comments General comments (skin integrity, edema, etc.): Instructed pt to not ambulate on his own due to buckling. Verb understanding. Relayed concerns of LEs buckling to nursing staff. Suggest HHPT follow-up after d/c.      Pertinent Vitals/Pain Pain Assessment Pain Assessment: Faces Faces Pain Scale: Hurts even more Pain Location: back- incisional Pain Descriptors / Indicators: Discomfort, Operative site guarding Pain Intervention(s): Monitored during session, Repositioned    Home Living                          Prior Function            PT Goals (current goals can now be found in the care plan section) Acute Rehab PT Goals Patient Stated Goal: Get well PT Goal Formulation: With patient Time For Goal Achievement: 10/05/23 Potential to Achieve Goals: Good Progress towards PT goals: Progressing toward goals    Frequency    Min 5X/week      PT Plan      Co-evaluation              AM-PAC PT 6 Clicks Mobility   Outcome Measure  Help needed turning from your back to your side while in a flat bed without using bedrails?: A Little Help needed moving from lying on your back to sitting on the side of a flat bed without using bedrails?: A Little Help needed moving to and from a bed to a chair (including a wheelchair)?: A Little Help needed standing up from a chair using your arms (e.g., wheelchair or bedside chair)?: A Little Help needed to walk in hospital room?: A Little Help needed climbing 3-5 steps with a railing? : A Little 6 Click Score: 18    End of Session Equipment Utilized During Treatment: Gait belt;Back brace Activity Tolerance:  Patient tolerated treatment well Patient left: with call bell/phone within reach;in chair Nurse Communication: Mobility status PT Visit Diagnosis: Unsteadiness on feet (R26.81);Pain;Muscle weakness (generalized) (M62.81);Other abnormalities of gait and mobility (R26.89) Pain - part of body:  (back)     Time: 9259-9241 PT Time Calculation (min) (ACUTE ONLY): 18 min  Charges:    $Gait Training: 8-22 mins PT General Charges $$ ACUTE PT VISIT: 1 Visit                     Leontine Roads, PT, DPT [facility]  Rehabilitation Services Physical Therapist Office: [phone] Website: [email or web address]    Leontine GORMAN Roads 09/29/2023, 9:21 AM

## 2023-09-29 NOTE — ED Triage Notes (Signed)
 Pt BIB [facility] from home for mechanical fall. Pt was discharged this morning post back surgery on Thursday. Pt was using walker and fell, no LOC, did not hit head, no blood thinners. Pt reports feeling weak since surgery. Per EMS pt was dx with a staph infection prior to discharge but was not given abx. VSS per EMS.

## 2023-09-30 MED ORDER — ORAL CARE MOUTH RINSE: 15.0000 mL | OROMUCOSAL | Status: DC | PRN

## 2023-09-30 MED FILL — Thrombin For Soln 5000 Unit: CUTANEOUS | Qty: 5000 | Status: AC

## 2023-09-30 NOTE — ED Notes (Signed)
 PT made unsuccessful attempt at [facility] on bedside commode. Back in bed and resting. No needs.

## 2023-09-30 NOTE — H&P (Addendum)
 Chief Complaint   Chief Complaint  Patient presents with   Fall    History of Present Illness  Corey Adams. is a 78 y.o. male who underwent uncomplicated multilevel lumbar decompression fusion by Dr. Mavis on 6/26 (3 days ago).  He had a largely uneventful hospital course and was discharged yesterday in stable condition.  Patient reports feeling well at home however when attempting to ambulate at home with his walker, he felt like his legs gave out after standing for about 10 minutes.  He was therefore brought back to the hospital.  Patient does not report having a fever or chills in the short time he was at home, however was noted to have slightly increased temperature here.  Patient does not report any new numbness tingling or weakness of the extremities.  He has unchanged appropriate back pain controlled with oral pain medicine.  Patient does report having adverse reaction to Flexeril  in the past, which did cause significant drowsiness and weakness as a potential cause of his symptoms.  Past Medical History   Past Medical History:  Diagnosis Date   Actinic keratitis    Alcohol abuse    history no drink since 2015   Anxiety    Arthritis    left thumb (03/05/2014)   Childhood asthma    Chronic lower back pain    CKD (chronic kidney disease), stage III (HCC)    Complication of anesthesia    difficulty urinating after anesthesia   Depression    Esophageal reflux    GERD (gastroesophageal reflux disease)    Hyperlipidemia    Hypertension    Kidney stones    plenty; no ORs (03/05/2014)   Memory change    Migraine    last one was in the 1990's (03/05/2014)   Multiple allergies    sinus stays swollen all the time   Pre-diabetes    Sleep apnea    have mask; don't use it (03/05/2014)   Urinary retention    s/p anesthesia    Past Surgical History   Past Surgical History:  Procedure Laterality Date   CARPAL TUNNEL RELEASE Bilateral 1990's   COLON SURGERY      DISTAL CLAVICLE EXCISION Right 03/05/2014   EXCISIONAL HEMORRHOIDECTOMY  1983   LAPAROSCOPIC LOW ANTERIOR RESECTION N/A 10/30/2012   Procedure: LAPAROSCOPIC LOW ANTERIOR RESECTION with Rigid Proctoscopy;  Surgeon: Deward GORMAN Curvin DOUGLAS, MD;  Location: [facility] OR;  Service: General;  Laterality: N/A;   NASAL SINUS SURGERY  1990's   TOTAL KNEE ARTHROPLASTY Left 08/05/2019   Procedure: LEFT TOTAL KNEE ARTHROPLASTY;  Surgeon: Sheril Coy, MD;  Location: [facility] ORS;  Service: Orthopedics;  Laterality: Left;   TOTAL SHOULDER ARTHROPLASTY Right 03/05/2014   Procedure: TOTAL SHOULDER ARTHROPLASTY;  Surgeon: Eva Elsie Herring, MD;  Location: [facility] OR;  Service: Orthopedics;  Laterality: Right;  Right shoulder replacement, distal clavicle excision   TOTAL SHOULDER ARTHROPLASTY Left 09/07/2016   TOTAL SHOULDER ARTHROPLASTY Left 09/07/2016   Procedure: TOTAL SHOULDER ARTHROPLASTY;  Surgeon: Herring Eva, MD;  Location: [facility] OR;  Service: Orthopedics;  Laterality: Left;  Left total shoulder arthroplasty   VASECTOMY      Social History   Social History   Tobacco Use   Smoking status: Never   Smokeless tobacco: Never  Vaping Use   Vaping status: Never Used  Substance Use Topics   Alcohol use: Yes    Comment: recovering alcoholic as of 08/08/2013   Drug use: Yes    Types: Marijuana  Comment: only in the 1960's    Medications   Prior to Admission medications   Medication Sig Start Date End Date Taking? Authorizing Provider  acetaminophen  (TYLENOL ) 500 MG tablet Take 1,000 mg by mouth every 6 (six) hours as needed.   Yes [provider]  atorvastatin  (LIPITOR) 10 MG tablet Take 10 mg by mouth daily.   Yes [provider]  cyclobenzaprine  (FLEXERIL ) 10 MG tablet Take 1 tablet (10 mg total) by mouth at bedtime as needed for muscle spasms. 09/29/23  Yes Lanis Pupa, MD  Oxycodone  HCl 10 MG TABS Take 10 mg by mouth 4 (four) times daily as needed. 09/29/23  Yes [provider]   bisoprolol -hydrochlorothiazide  (ZIAC ) 2.5-6.25 MG tablet Take 1 tablet by mouth daily.    [provider]  escitalopram  (LEXAPRO ) 10 MG tablet Take 10 mg by mouth daily.    [provider]  loratadine  (CLARITIN ) 10 MG tablet Take 20 mg by mouth at bedtime.    [provider]  omeprazole (PRILOSEC) 40 MG capsule Take 40 mg by mouth daily.  10/28/12   [provider]    Allergies   Allergies  Allergen Reactions   Shellfish Allergy Anaphylaxis   Guar Gum Hives   Xanthan Gum Hives   Codeine Nausea And Vomiting    Review of Systems  ROS  Neurologic Exam  Awake, alert, oriented Memory and concentration grossly intact Speech fluent, appropriate CN grossly intact Motor exam: Upper Extremities Deltoid Bicep Tricep Grip  Right 5/5 5/5 5/5 5/5  Left 5/5 5/5 5/5 5/5   Lower Extremities IP Quad PF DF EHL  Right 5/5 5/5 5/5 5/5 5/5  Left 5/5 5/5 5/5 5/5 5/5   Sensation grossly intact to LT  Imaging  CT scan of the lumbar spine reviewed and demonstrates the L2-L5 instrumented decompression and fusion.  Largely unremarkable, expected postoperative appearance  Impression  - 78 y.o. male POD#3 s/p L2-5 fusion, re-admitted after difficulty standing at home.  He has a normal neurologic exam.  No evidence of postoperative infection.  By history, patient reports similar drowsiness and weakness after administration of Flexeril  in the past.  Plan  - Will admit for observation - No need for empiric abx at this time, can f/u labs/cultures - Can get PT/OT re-evaluation - will d/c flexeril    Pupa Lanis, MD [facility]

## 2023-09-30 NOTE — Plan of Care (Signed)

## 2023-09-30 NOTE — ED Notes (Signed)
 Patient moved to hospital bed for comfort

## 2023-10-01 NOTE — NC FL2 (Signed)
 [place]  MEDICAID FL2 LEVEL OF CARE FORM     IDENTIFICATION  Patient Name: Corey Adams. Birthdate: [birth year] Sex: male Admission Date (Current Location): 09/29/2023  [place] and [place] Number:  [employer] and Address:  [facility] [place], [address], [place], [place] [address]      Provider Number: [id number]  Attending Physician Name and Address:  Mavis Purchase, MD  Relative Name and Phone Number:       Current Level of Care: Hospital Recommended Level of Care: Skilled Nursing Facility Prior Approval Number:    Date Approved/Denied:   PASRR Number: [id number]  Discharge Plan: SNF    Current Diagnoses: Patient Active Problem List   Diagnosis Date Noted   Fever 09/29/2023   SIRS (systemic inflammatory response syndrome) (HCC) 09/29/2023   Degenerative scoliosis in adult patient 09/27/2023   Syncope 07/24/2023   Essential tremor 05/15/2022   Mild cognitive impairment 08/26/2020   Balance disorder 08/26/2020   Primary osteoarthritis of left knee 08/05/2019   S/P shoulder replacement, left 09/07/2016   Status post total shoulder arthroplasty 03/05/2014   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 09/06/2012    Orientation RESPIRATION BLADDER Height & Weight     Self, Time, Situation, Place  Normal Continent Weight: 188 lb (85.3 kg) Height:  5' 8 (172.7 cm)  BEHAVIORAL SYMPTOMS/MOOD NEUROLOGICAL BOWEL NUTRITION STATUS      Continent Diet (Regular)  AMBULATORY STATUS COMMUNICATION OF NEEDS Skin   Limited Assist Verbally Surgical wounds (closed back incision, foam dressing: lift every shift to assess, change PRN)                       Personal Care Assistance Level of Assistance  Bathing, Feeding, Dressing Bathing Assistance: Limited assistance Feeding assistance: Limited assistance Dressing Assistance: Limited assistance     Functional Limitations Info  Sight, Hearing Sight Info: Impaired Hearing Info:  Impaired      SPECIAL CARE FACTORS FREQUENCY  PT (By licensed PT), OT (By licensed OT)     PT Frequency: 5x/wk OT Frequency: 5x/wk            Contractures Contractures Info: Not present    Additional Factors Info  Code Status, Allergies, Psychotropic Code Status Info: Full Allergies Info: Shellfish Allergy, Guar Gum, Xanthan Gum, Codeine Psychotropic Info: Lexapro  10mg  daily         Current Medications (10/01/2023):  This is the current hospital active medication list Current Facility-Administered Medications  Medication Dose Route Frequency Provider Last Rate Last Admin   acetaminophen  (TYLENOL ) tablet 500 mg  500 mg Oral Q6H PRN Lanis Pupa, MD   500 mg at 10/01/23 1050   atorvastatin  (LIPITOR) tablet 10 mg  10 mg Oral QHS Nundkumar, Neelesh, MD   10 mg at 09/30/23 2123   bisoprolol  (ZEBETA ) tablet 2.5 mg  2.5 mg Oral Daily Nundkumar, Neelesh, MD   2.5 mg at 10/01/23 1050   And   hydrochlorothiazide  (HYDRODIURIL ) tablet 6.25 mg  6.25 mg Oral Daily Nundkumar, Neelesh, MD   6.25 mg at 10/01/23 1050   escitalopram  (LEXAPRO ) tablet 10 mg  10 mg Oral Daily Nundkumar, Neelesh, MD   10 mg at 10/01/23 1050   loratadine  (CLARITIN ) tablet 10 mg  10 mg Oral Daily PRN Lanis Pupa, MD       Oral care mouth rinse  15 mL Mouth Rinse PRN Lanis Pupa, MD       oxyCODONE  (Oxy IR/ROXICODONE ) immediate release tablet 10  mg  10 mg Oral Q6H PRN Lanis Pupa, MD   10 mg at 10/01/23 9185   pantoprazole  (PROTONIX ) EC tablet 40 mg  40 mg Oral Daily Nundkumar, Neelesh, MD   40 mg at 10/01/23 1050   Facility-Administered Medications Ordered in Other Encounters  Medication Dose Route Frequency Provider Last Rate Last Admin   chlorhexidine  (HIBICLENS ) 4 % liquid   Topical Once Dozier Soulier, MD         Discharge Medications: Please see discharge summary for a list of discharge medications.  Relevant Imaging Results:  Relevant Lab Results:   Additional  Information SS#: [id number]  Almarie CHRISTELLA Goodie, LCSW

## 2023-10-01 NOTE — Plan of Care (Signed)
  Problem: Education: Goal: Knowledge of General Education information will improve Description: Including pain rating scale, medication(s)/side effects and non-pharmacologic comfort measures 10/01/2023 0510 by Merilee Jeoffrey BROCKS, RN Outcome: Progressing 10/01/2023 0432 by Merilee Jeoffrey BROCKS, RN Outcome: Progressing   Problem: Health Behavior/Discharge Planning: Goal: Ability to manage health-related needs will improve 10/01/2023 0510 by Merilee Jeoffrey BROCKS, RN Outcome: Progressing 10/01/2023 0432 by Merilee Jeoffrey BROCKS, RN Outcome: Progressing   Problem: Clinical Measurements: Goal: Ability to maintain clinical measurements within normal limits will improve 10/01/2023 0510 by Merilee Jeoffrey BROCKS, RN Outcome: Progressing 10/01/2023 0432 by Merilee Jeoffrey BROCKS, RN Outcome: Progressing Goal: Will remain free from infection 10/01/2023 0510 by Merilee Jeoffrey BROCKS, RN Outcome: Progressing 10/01/2023 0432 by Merilee Jeoffrey BROCKS, RN Outcome: Progressing Goal: Diagnostic test results will improve 10/01/2023 0510 by Merilee Jeoffrey BROCKS, RN Outcome: Progressing 10/01/2023 0432 by Merilee Jeoffrey BROCKS, RN Outcome: Progressing Goal: Respiratory complications will improve 10/01/2023 0510 by Merilee Jeoffrey BROCKS, RN Outcome: Progressing 10/01/2023 0432 by Merilee Jeoffrey BROCKS, RN Outcome: Progressing Goal: Cardiovascular complication will be avoided 10/01/2023 0510 by Merilee Jeoffrey BROCKS, RN Outcome: Progressing 10/01/2023 0432 by Merilee Jeoffrey BROCKS, RN Outcome: Progressing   Problem: Activity: Goal: Risk for activity intolerance will decrease 10/01/2023 0510 by Merilee Jeoffrey BROCKS, RN Outcome: Progressing 10/01/2023 0432 by Merilee Jeoffrey BROCKS, RN Outcome: Progressing   Problem: Nutrition: Goal: Adequate nutrition will be maintained 10/01/2023 0510 by Merilee Jeoffrey BROCKS, RN Outcome: Progressing 10/01/2023 0432 by Merilee Jeoffrey BROCKS, RN Outcome: Progressing   Problem: Coping: Goal: Level of anxiety will decrease 10/01/2023  0510 by Merilee Jeoffrey BROCKS, RN Outcome: Progressing 10/01/2023 0432 by Merilee Jeoffrey BROCKS, RN Outcome: Progressing   Problem: Elimination: Goal: Will not experience complications related to bowel motility 10/01/2023 0510 by Merilee Jeoffrey BROCKS, RN Outcome: Progressing 10/01/2023 0432 by Merilee Jeoffrey BROCKS, RN Outcome: Progressing Goal: Will not experience complications related to urinary retention 10/01/2023 0510 by Merilee Jeoffrey BROCKS, RN Outcome: Progressing 10/01/2023 0432 by Merilee Jeoffrey BROCKS, RN Outcome: Progressing   Problem: Pain Managment: Goal: General experience of comfort will improve and/or be controlled 10/01/2023 0510 by Merilee Jeoffrey BROCKS, RN Outcome: Progressing 10/01/2023 0432 by Merilee Jeoffrey BROCKS, RN Outcome: Progressing   Problem: Safety: Goal: Ability to remain free from injury will improve 10/01/2023 0510 by Merilee Jeoffrey BROCKS, RN Outcome: Progressing 10/01/2023 0432 by Merilee Jeoffrey BROCKS, RN Outcome: Progressing   Problem: Skin Integrity: Goal: Risk for impaired skin integrity will decrease 10/01/2023 0510 by Merilee Jeoffrey BROCKS, RN Outcome: Progressing 10/01/2023 0432 by Merilee Jeoffrey BROCKS, RN Outcome: Progressing

## 2023-10-01 NOTE — TOC Initial Note (Signed)
 Transition of Care (TOC) - Initial/Assessment Note    Patient Details  Name: Corey Adams. MRN: [id number] Date of Birth: 06-26-1945  Transition of Care [facility]) CM/SW Contact:    Almarie CHRISTELLA Goodie, LCSW Phone Number: 10/01/2023, 3:43 PM  Clinical Narrative:     Patient and spouse in agreement with SNF, requesting [facility] or [facility]. CSW completed referral and faxed out, asked SNF to review. Awaiting response.               Expected Discharge Plan: Skilled Nursing Facility Barriers to Discharge: Continued Medical Work up, English as a second language teacher   Patient Goals and CMS Choice Patient states their goals for this hospitalization and ongoing recovery are:: get better CMS Medicare.gov Compare Post Acute Care list provided to:: Patient Choice offered to / list presented to : Patient [place] ownership interest in [facility].provided to:: Patient    Expected Discharge Plan and Services     Post Acute Care Choice: Skilled Nursing Facility Living arrangements for the past 2 months: Single Family Home                                      Prior Living Arrangements/Services Living arrangements for the past 2 months: Single Family Home Lives with:: Spouse Patient language and need for interpreter reviewed:: No Do you feel safe going back to the place where you live?: Yes      Need for Family Participation in Patient Care: No (Comment) Care giver support system in place?: Yes (comment)   Criminal Activity/Legal Involvement Pertinent to Current Situation/Hospitalization: No - Comment as needed  Activities of Daily Living   ADL Screening (condition at time of admission) Independently performs ADLs?: Yes (appropriate for developmental age) Is the patient deaf or have difficulty hearing?: No Does the patient have difficulty seeing, even when wearing glasses/contacts?: No Does the patient have difficulty concentrating, remembering, or making decisions?:  No  Permission Sought/Granted Permission sought to share information with : Facility Medical sales representative, Family Supports Permission granted to share information with : Yes, Verbal Permission Granted  Share Information with NAME: Almarie  Permission granted to share info w AGENCY: SNF  Permission granted to share info w Relationship: Spouse     Emotional Assessment Appearance:: Appears stated age Attitude/Demeanor/Rapport: Engaged Affect (typically observed): Appropriate Orientation: : Oriented to Self, Oriented to Place, Oriented to  Time, Oriented to Situation Alcohol / Substance Use: Not Applicable Psych Involvement: No (comment)  Admission diagnosis:  Fever [R50.9] SIRS (systemic inflammatory response syndrome) (HCC) [R65.10] Fall, initial encounter [W19.XXXA] Patient Active Problem List   Diagnosis Date Noted   Fever 09/29/2023   SIRS (systemic inflammatory response syndrome) (HCC) 09/29/2023   Degenerative scoliosis in adult patient 09/27/2023   Syncope 07/24/2023   Essential tremor 05/15/2022   Mild cognitive impairment 08/26/2020   Balance disorder 08/26/2020   Primary osteoarthritis of left knee 08/05/2019   S/P shoulder replacement, left 09/07/2016   Status post total shoulder arthroplasty 03/05/2014   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 09/06/2012   PCP:  Sun, Vyvyan, MD Pharmacy:   [facility] [phone] - [place], [place] - [address] AT [address] [place] [address] Phone: [phone] Fax: [phone]  [facility] - [address] [place] - [address] [place] [address] Phone: [phone] Fax: [phone]     Social Drivers of Health (SDOH) Social  History: SDOH Screenings   Food Insecurity: No Food Insecurity (09/30/2023)  Housing: Low Risk  (09/30/2023)  Transportation Needs: No Transportation Needs (09/30/2023)  Utilities: Not At Risk (09/30/2023)  Social Connections: Moderately  Integrated (09/30/2023)  Tobacco Use: Low Risk  (09/29/2023)   SDOH Interventions:     Readmission Risk Interventions    07/27/2023   12:29 PM 07/26/2023   12:52 PM  Readmission Risk Prevention Plan  Post Dischage Appt Complete Complete  Medication Screening Complete Complete  Transportation Screening Complete Complete

## 2023-10-01 NOTE — Plan of Care (Incomplete)

## 2023-10-01 NOTE — Progress Notes (Signed)
 Subjective: The patient is alert and pleasant and in no apparent distress.  He looks well.  He is eating his breakfast.  His wife is at the bedside.  Objective: Vital signs in last 24 hours: Temp:  [99.1 F (37.3 C)-102.1 F (38.9 C)] 99.7 F (37.6 C) (06/30 1202) Pulse Rate:  [90-101] 92 (06/30 1202) Resp:  [12-20] 14 (06/30 1202) BP: (120-152)/(60-84) 124/70 (06/30 1202) SpO2:  [91 %-99 %] 96 % (06/30 1202) Estimated body mass index is 28.59 kg/m as calculated from the following:   Height as of this encounter: 5' 8 (1.727 m).   Weight as of this encounter: 85.3 kg.   Intake/Output from previous day: 06/29 0701 - 06/30 0700 In: 1231 [P.O.:240; I.V.:991] Out: -  Intake/Output this shift: No intake/output data recorded.  Physical exam the patient is alert and pleasant.  His wound is healing well.  His strength is normal.  I have reviewed the patient's postoperative lumbar CT it looks good with good position of the instrumentation without obvious complicating features.  Lab Results: Recent Labs    09/29/23 1520  WBC 9.1  HGB 9.5*  HCT 29.9*  PLT 120*   BMET Recent Labs    09/29/23 1520  NA 137  K 3.4*  CL 102  CO2 25  GLUCOSE 118*  BUN 15  CREATININE 1.30*  CALCIUM  8.8*    Studies/Results: CT Lumbar Spine Wo Contrast Result Date: 09/29/2023 CLINICAL DATA:  Back trauma after fall today. Status post lumbar decompression 6/26 EXAM: CT LUMBAR SPINE WITHOUT CONTRAST TECHNIQUE: Multidetector CT imaging of the lumbar spine was performed without intravenous contrast administration. Multiplanar CT image reconstructions were also generated. RADIATION DOSE REDUCTION: This exam was performed according to the departmental dose-optimization program which includes automated exposure control, adjustment of the mA and/or kV according to patient size and/or use of iterative reconstruction technique. COMPARISON:  Radiograph 09/27/2023 FINDINGS: Segmentation: 5 lumbar type  vertebrae. Alignment: No evidence of traumatic listhesis. Unchanged grade 1 retrolisthesis of L1. Vertebrae: No acute fracture. Paraspinal and other soft tissues: Moderate right greater than left hydroureteronephrosis. Partially visualized spiculated stone measuring 2.1 cm in the posterior right bladder near the UVJ. 3 mm stone in the distal left ureter. Additional bilateral nonobstructing nephrolithiasis. Moderate hiatal hernia. Aortic atherosclerotic calcification. Disc levels: Posterior fusion and decompression L2-L5. Interbody spacers at L2-L3, L3-L4, and L4-L5. No evidence of hardware failure loosening. Ill-defined fluid and locules of gas about the surgical bed is presumed postoperative from operation on 09/27/2023. IMPRESSION: 1. No acute fracture or traumatic listhesis in the lumbar spine. 2. Posterior fusion and decompression L2-L5. Ill-defined fluid and locules of gas about the surgical bed is presumed postoperative from operation on 09/27/2023. 3. Moderate right greater than left hydroureteronephrosis. Partially visualized spiculated stone measuring 2.1 cm in the posterior right bladder near the UVJ. 3 mm stone in the distal left ureter. 4. Moderate hiatal hernia. 5. Aortic Atherosclerosis (ICD10-I70.0). Electronically Signed   By: Norman Gatlin M.D.   On: 09/29/2023 17:33   CT Cervical Spine Wo Contrast Result Date: 09/29/2023 CLINICAL DATA:  Neck trauma EXAM: CT CERVICAL SPINE WITHOUT CONTRAST TECHNIQUE: Multidetector CT imaging of the cervical spine was performed without intravenous contrast. Multiplanar CT image reconstructions were also generated. RADIATION DOSE REDUCTION: This exam was performed according to the departmental dose-optimization program which includes automated exposure control, adjustment of the mA and/or kV according to patient size and/or use of iterative reconstruction technique. COMPARISON:  None are available FINDINGS: Alignment: No evidence of  traumatic listhesis. Skull  base and vertebrae: No acute fracture. Soft tissues and spinal canal: No prevertebral fluid or swelling. No visible canal hematoma. Disc levels: Multilevel spondylosis, disc space height loss, and degenerative endplate changes greatest at C5-C6 and C6-C7 where it is moderate to advanced. Posterior disc osteophyte complexes at C4-C5, C5-C6, and C6-C7 cause mild to moderate effacement of the ventral thecal sac worst at C5-C6. No severe spinal canal narrowing. Upper chest: No acute abnormality. Other: Carotid calcification. IMPRESSION: No evidence of acute fracture or traumatic listhesis. Electronically Signed   By: Norman Gatlin M.D.   On: 09/29/2023 17:23   DG Chest Portable 1 View Result Date: 09/29/2023 CLINICAL DATA:  Fall.  Question pneumonia. EXAM: PORTABLE CHEST 1 VIEW COMPARISON:  Chest radiograph 07/24/2023 FINDINGS: Lower lung volumes from prior exam. Stable heart size and mediastinal contours. No focal airspace disease. No pulmonary edema. No pleural effusion or pneumothorax. Bilateral shoulder arthroplasty IMPRESSION: Low lung volumes without acute chest finding. Electronically Signed   By: Andrea Gasman M.D.   On: 09/29/2023 16:23    Assessment/Plan: Postoperative day #4: Patient's wife does not feel she can care for him in the immediate postoperative period.  She would like to get him into [facility] rehab.  I will ask care management to start working on that.  LOS: 2 days     Corey Adams Budge 10/01/2023, 12:04 PM     Patient ID: Corey DELENA Blanca Mickey., male   DOB: [birth year], 78 y.o.   MRN: [id number]

## 2023-10-01 NOTE — Evaluation (Addendum)
 Physical Therapy Evaluation  Patient Details Name: Corey Adams. MRN: [id number] DOB: [birth year] Today's Date: 10/01/2023  History of Present Illness  Pt is a 78 y/o male who presents 09/29/2023 after d/c home from recent L2-L5 PLIF on 09/27/2023. Wife reports fall at home and pt's knees buckling. PMH significant for CKD III, HTN, cog deficit, migraine, pre-diabetes, B carpal tunnel release, distal clavicle excision, L TKA 2021, B TSA.   Clinical Impression  Pt admitted with above diagnosis. At the time of PT eval, pt was able to demonstrate transfers and ambulation with gross CGA to min assist and RW for support. Pt was educated on precautions, brace application/wearing schedule, appropriate activity progression, and positioning recommendations. Pt currently with functional limitations due to the deficits listed below (see PT Problem List). Pt will benefit from skilled PT to increase their independence and safety with mobility to allow discharge to the venue listed below.          If plan is discharge home, recommend the following: A little help with walking and/or transfers;A little help with bathing/dressing/bathroom;Assistance with cooking/housework;Assist for transportation;Help with stairs or ramp for entrance   Can travel by private vehicle   Yes    Equipment Recommendations Rolling walker (2 wheels)  Recommendations for Other Services       Functional Status Assessment Patient has had a recent decline in their functional status and demonstrates the ability to make significant improvements in function in a reasonable and predictable amount of time.     Precautions / Restrictions Precautions Precautions: Fall;Back Precaution Booklet Issued: Yes (comment) Recall of Precautions/Restrictions: Impaired Precaution/Restrictions Comments: Pt was educated on precautions during functional mobility. Able to recall 2/3 at end of session. Required Braces or Orthoses: Spinal  Brace Spinal Brace: Lumbar corset;Applied in sitting position Restrictions Weight Bearing Restrictions Per Provider Order: No      Mobility  Bed Mobility Overal bed mobility: Needs Assistance Bed Mobility: Rolling, Sidelying to Sit Rolling: Contact guard assist Sidelying to sit: Contact guard assist     Sit to sidelying: Min assist General bed mobility comments: VC's for optimal log roll. Assist for LE elevation back up into bed at end of session    Transfers Overall transfer level: Needs assistance Equipment used: Rolling walker (2 wheels) Transfers: Sit to/from Stand Sit to Stand: Contact guard assist           General transfer comment: VC's for hand placement on seated surface for safety. No assist required but hands on guarding provided for safety.    Ambulation/Gait Ambulation/Gait assistance: Contact guard assist Gait Distance (Feet): 150 Feet Assistive device: Rolling walker (2 wheels) Gait Pattern/deviations: Step-through pattern, Decreased stride length, Knees buckling, Trunk flexed, Antalgic Gait velocity: Decreased Gait velocity interpretation: 1.31 - 2.62 ft/sec, indicative of limited community ambulator   General Gait Details: VC's for improved posture, closer walker proximity and forward gaze. Pt without overt knee buckling or LOB.  Stairs            Wheelchair Mobility     Tilt Bed    Modified Rankin (Stroke Patients Only)       Balance Overall balance assessment: Needs assistance Sitting-balance support: No upper extremity supported, Feet supported Sitting balance-Leahy Scale: Fair   Postural control: Posterior lean Standing balance support: Bilateral upper extremity supported, During functional activity Standing balance-Leahy Scale: Poor  Pertinent Vitals/Pain Pain Assessment Pain Assessment: Faces Faces Pain Scale: Hurts little more Pain Location: back- incisional Pain Descriptors /  Indicators: Operative site guarding, Sore Pain Intervention(s): Limited activity within patient's tolerance, Monitored during session, Repositioned    Home Living Family/patient expects to be discharged to:: Private residence Living Arrangements: Spouse/significant other Available Help at Discharge: Family Type of Home: House Home Access: Stairs to enter Entrance Stairs-Rails: Doctor, general practice of Steps: 4   Home Layout: One level Home Equipment: Cane - single point;BSC/3in1;Shower seat;Grab bars - tub/shower;Standard Walker;Rollator (4 wheels)      Prior Function Prior Level of Function : Independent/Modified Independent             Mobility Comments: Fall after d/c home. Wife reports knees buckling and pt feeling weak. ADLs Comments: Has not showered since prior to surgery. Wife asking for assistance from PT to get pt in the shower. Educated on protocol and precautions.     Extremity/Trunk Assessment   Upper Extremity Assessment Upper Extremity Assessment: Defer to OT evaluation    Lower Extremity Assessment Lower Extremity Assessment: Generalized weakness    Cervical / Trunk Assessment Cervical / Trunk Assessment: Back Surgery  Communication   Communication Communication: No apparent difficulties    Cognition Arousal: Alert Behavior During Therapy: WFL for tasks assessed/performed   PT - Cognitive impairments: Memory                         Following commands: Intact       Cueing Cueing Techniques: Verbal cues     General Comments General comments (skin integrity, edema, etc.): Pt stood at sink to brush teeth utilizing 2 cup method without incident. No knee buckling noted.    Exercises     Assessment/Plan    PT Assessment Patient needs continued PT services  PT Problem List Decreased strength;Decreased activity tolerance;Decreased balance;Decreased mobility;Decreased knowledge of use of DME;Decreased safety  awareness;Decreased knowledge of precautions;Pain       PT Treatment Interventions DME instruction;Gait training;Stair training;Functional mobility training;Therapeutic activities;Therapeutic exercise;Balance training;Patient/family education    PT Goals (Current goals can be found in the Care Plan section)  Acute Rehab PT Goals Patient Stated Goal: Get well PT Goal Formulation: With patient Time For Goal Achievement: 10/15/23 Potential to Achieve Goals: Good    Frequency Min 3X/week     Co-evaluation               AM-PAC PT 6 Clicks Mobility  Outcome Measure Help needed turning from your back to your side while in a flat bed without using bedrails?: A Little Help needed moving from lying on your back to sitting on the side of a flat bed without using bedrails?: A Little Help needed moving to and from a bed to a chair (including a wheelchair)?: A Little Help needed standing up from a chair using your arms (e.g., wheelchair or bedside chair)?: A Little Help needed to walk in hospital room?: A Little Help needed climbing 3-5 steps with a railing? : A Little 6 Click Score: 18    End of Session Equipment Utilized During Treatment: Gait belt;Back brace Activity Tolerance: Patient tolerated treatment well Patient left: with call bell/phone within reach;in chair Nurse Communication: Mobility status PT Visit Diagnosis: Unsteadiness on feet (R26.81);Pain;Muscle weakness (generalized) (M62.81);Other abnormalities of gait and mobility (R26.89) Pain - part of body:  (back)    Time: 1410-1456 PT Time Calculation (min) (ACUTE ONLY): 46 min   Charges:  PT Evaluation $PT Eval Low Complexity: 1 Low PT Treatments $Gait Training: 23-37 mins PT General Charges $$ ACUTE PT VISIT: 1 Visit         Leita Sable, PT, DPT Acute Rehabilitation Services Secure Chat Preferred Office: [phone]   Leita JONETTA Sable 10/01/2023, 3:18 PM

## 2023-10-02 NOTE — Evaluation (Signed)
 Occupational Therapy Evaluation Patient Details Name: Corey Adams. MRN: [id number] DOB: 11/26/1945 Today's Date: 10/02/2023   History of Present Illness   Pt is a 78 y/o male who presents 09/29/2023 after d/c home from recent L2-L5 PLIF on 09/27/2023. Wife reports fall at home and pt's knees buckling. PMH significant for CKD III, HTN, cog deficit, migraine, pre-diabetes, B carpal tunnel release, distal clavicle excision, L TKA 2021, B TSA.     Clinical Impressions Pt admitted for above and presents with problem list below.  Today, he requires contact guard for bed mobility, transfers and mobility in room using RW; up to contact guard assist for Adls.  He requires cueing for back precaution adherence and safety awareness during session, mild impulsivity and requires redirection. He was educated on back precautions, brace, safety and ADL compensatory techniques.  Based on performance today, believe patient will best benefit from continued OT services acutely and after dc at inpatient setting with <3hrs/day to optimize independence, safety with ADLs and mobility, as well as to decrease burden of care.     If plan is discharge home, recommend the following:   A little help with walking and/or transfers;A little help with bathing/dressing/bathroom;Assistance with cooking/housework;Assist for transportation;Help with stairs or ramp for entrance;Direct supervision/assist for medications management;Direct supervision/assist for financial management     Functional Status Assessment   Patient has had a recent decline in their functional status and demonstrates the ability to make significant improvements in function in a reasonable and predictable amount of time.     Equipment Recommendations   None recommended by OT     Recommendations for Other Services         Precautions/Restrictions   Precautions Precautions: Fall;Back Precaution Booklet Issued: Yes (comment) Recall of  Precautions/Restrictions: Impaired Precaution/Restrictions Comments: Pt was educated on precautions during functional mobility. requires cueing to adhere functionally Required Braces or Orthoses: Spinal Brace Spinal Brace: Lumbar corset;Applied in sitting position Restrictions Weight Bearing Restrictions Per Provider Order: No     Mobility Bed Mobility Overal bed mobility: Needs Assistance Bed Mobility: Rolling, Sidelying to Sit, Sit to Sidelying Rolling: Contact guard assist Sidelying to sit: Contact guard assist     Sit to sidelying: Contact guard assist General bed mobility comments: VC's for optimal log roll. increased time required    Transfers Overall transfer level: Needs assistance Equipment used: Rolling walker (2 wheels) Transfers: Sit to/from Stand Sit to Stand: Contact guard assist           General transfer comment: VC's for hand placement on seated surface for safety.      Balance Overall balance assessment: Needs assistance Sitting-balance support: No upper extremity supported, Feet supported Sitting balance-Leahy Scale: Fair Sitting balance - Comments: cueing for upright posture   Standing balance support: Bilateral upper extremity supported, During functional activity, Single extremity supported Standing balance-Leahy Scale: Poor Standing balance comment: 0-1 hand support at sink during ADls with contact guard support, relies on RW dynamically                           ADL either performed or assessed with clinical judgement   ADL Overall ADL's : Needs assistance/impaired     Grooming: Oral care;Contact guard assist;Standing Grooming Details (indicate cue type and reason): cueing for posture and technique         Upper Body Dressing : Minimal assistance;Sitting Upper Body Dressing Details (indicate cue type and reason): brace mgmt Lower Body  Dressing: Minimal assistance;Sit to/from stand Lower Body Dressing Details (indicate cue  type and reason): figure 4 technique to pull socks up, contact guard to stand Toilet Transfer: Contact guard assist;Rolling walker (2 wheels) Toilet Transfer Details (indicate cue type and reason): cueing for hand placement and posture         Functional mobility during ADLs: Contact guard assist;Rolling walker (2 wheels);Cueing for safety;Cueing for sequencing General ADL Comments: Reviewed back precautions and compensatory strategies     Vision   Vision Assessment?: No apparent visual deficits     Perception         Praxis         Pertinent Vitals/Pain Pain Assessment Pain Assessment: Faces Faces Pain Scale: Hurts a little bit Pain Location: back- incisional Pain Descriptors / Indicators: Operative site guarding, Sore Pain Intervention(s): Limited activity within patient's tolerance, Monitored during session, Repositioned     Extremity/Trunk Assessment Upper Extremity Assessment Upper Extremity Assessment: Generalized weakness   Lower Extremity Assessment Lower Extremity Assessment: Defer to PT evaluation   Cervical / Trunk Assessment Cervical / Trunk Assessment: Back Surgery   Communication Communication Communication: No apparent difficulties   Cognition Arousal: Alert Behavior During Therapy: WFL for tasks assessed/performed Cognition: Cognition impaired     Awareness: Online awareness impaired Memory impairment (select all impairments): Working memory Attention impairment (select first level of impairment): Sustained attention Executive functioning impairment (select all impairments): Problem solving, Reasoning OT - Cognition Comments: pt slightly impulsive but easily redirected, he requires cueing for safety and probelm sovling.  tends to require convincing he needs assist for safety and fall prevention                 Following commands: Intact       Cueing  General Comments   Cueing Techniques: Verbal cues      Exercises     Shoulder  Instructions      Home Living Family/patient expects to be discharged to:: Private residence Living Arrangements: Spouse/significant other Available Help at Discharge: Family Type of Home: House Home Access: Stairs to enter Secretary/administrator of Steps: 4 Entrance Stairs-Rails: Right;Left Home Layout: One level     Bathroom Shower/Tub: Walk-in Pensions consultant: Handicapped height Bathroom Accessibility: Yes   Home Equipment: Cane - single point;BSC/3in1;Shower seat;Grab bars - tub/shower;Standard Walker;Rollator (4 wheels)          Prior Functioning/Environment Prior Level of Function : Independent/Modified Independent             Mobility Comments: Fall after d/c home. Wife reports knees buckling and pt feeling weak. ADLs Comments: pt needing assist for ADLs since dc home.  prior to surgery independent    OT Problem List: Decreased strength;Decreased activity tolerance;Impaired balance (sitting and/or standing);Pain;Decreased knowledge of precautions;Decreased knowledge of use of DME or AE;Decreased safety awareness   OT Treatment/Interventions: Self-care/ADL training;Therapeutic exercise;DME and/or AE instruction;Therapeutic activities;Balance training;Patient/family education;Cognitive remediation/compensation      OT Goals(Current goals can be found in the care plan section)   Acute Rehab OT Goals Patient Stated Goal: get to rehab OT Goal Formulation: With patient/family Time For Goal Achievement: 10/16/23 Potential to Achieve Goals: Good   OT Frequency:  Min 2X/week    Co-evaluation              AM-PAC OT 6 Clicks Daily Activity     Outcome Measure Help from another person eating meals?: None Help from another person taking care of personal grooming?: A Little Help from another  person toileting, which includes using toliet, bedpan, or urinal?: A Little Help from another person bathing (including washing, rinsing, drying)?: A  Little Help from another person to put on and taking off regular upper body clothing?: A Little Help from another person to put on and taking off regular lower body clothing?: A Little 6 Click Score: 19   End of Session Equipment Utilized During Treatment: Gait belt;Rolling walker (2 wheels);Back brace Nurse Communication: Mobility status;Precautions  Activity Tolerance: Patient tolerated treatment well Patient left: with call bell/phone within reach;in chair  OT Visit Diagnosis: Other abnormalities of gait and mobility (R26.89);Muscle weakness (generalized) (M62.81)                Time: 8874-8846 OT Time Calculation (min): 28 min Charges:  OT General Charges $OT Visit: 1 Visit OT Evaluation $OT Eval Moderate Complexity: 1 Mod OT Treatments $Self Care/Home Management : 8-22 mins  Etta NOVAK, OT Acute Rehabilitation Services Office [phone] Secure Chat Preferred    Etta GORMAN Hope 10/02/2023, 1:24 PM

## 2023-10-02 NOTE — Progress Notes (Signed)
 Physical Therapy Treatment Patient Details Name: Corey Adams. MRN: [id number] DOB: [birth year] Today's Date: 10/02/2023   History of Present Illness Pt is a 78 y/o male who presents 09/29/2023 after d/c home from recent L2-L5 PLIF on 09/27/2023. Wife reports fall at home and pt's knees buckling. PMH significant for CKD III, HTN, cog deficit, migraine, pre-diabetes, B carpal tunnel release, distal clavicle excision, L TKA 2021, B TSA.    PT Comments  Pt received in supine and agreeable to session. Pt demonstrates logroll technique with CGA, however pt's wife reports he does not perform this technique when staff is not in the room. Pt able to don brace with min A. Pt able to tolerate slightly increased gait distance with CGA for safety, however continues to be limited by impaired activity tolerance. Pt agreeable to sit in the recliner at the end of the session for improved upright posture. Education provided on activity progression and precautions. Pt continues to benefit from PT services to progress toward functional mobility goals.    If plan is discharge home, recommend the following: A little help with walking and/or transfers;A little help with bathing/dressing/bathroom;Assistance with cooking/housework;Assist for transportation;Help with stairs or ramp for entrance   Can travel by private vehicle     Yes  Equipment Recommendations  Rolling walker (2 wheels)    Recommendations for Other Services       Precautions / Restrictions Precautions Precautions: Fall;Back Recall of Precautions/Restrictions: Impaired Required Braces or Orthoses: Spinal Brace Spinal Brace: Lumbar corset;Applied in sitting position Restrictions Weight Bearing Restrictions Per Provider Order: No     Mobility  Bed Mobility Overal bed mobility: Needs Assistance Bed Mobility: Sidelying to Sit   Sidelying to sit: Contact guard assist       General bed mobility comments: increased time and cues for  sequencing    Transfers Overall transfer level: Needs assistance Equipment used: Rolling walker (2 wheels) Transfers: Sit to/from Stand Sit to Stand: Contact guard assist           General transfer comment: cues for hand placement and CGA for safety    Ambulation/Gait Ambulation/Gait assistance: Contact guard assist Gait Distance (Feet): 175 Feet Assistive device: Rolling walker (2 wheels) Gait Pattern/deviations: Step-through pattern, Decreased stride length, Trunk flexed Gait velocity: Decreased     General Gait Details: cues for upright posture and RW proximity. Pt able to navigate obstacles with CGA for safety   Stairs             Wheelchair Mobility     Tilt Bed    Modified Rankin (Stroke Patients Only)       Balance Overall balance assessment: Needs assistance Sitting-balance support: No upper extremity supported, Feet supported Sitting balance-Leahy Scale: Fair     Standing balance support: Bilateral upper extremity supported, During functional activity Standing balance-Leahy Scale: Poor Standing balance comment: with RW support                            Communication Communication Communication: No apparent difficulties  Cognition Arousal: Alert Behavior During Therapy: WFL for tasks assessed/performed   PT - Cognitive impairments: Memory                         Following commands: Intact      Cueing Cueing Techniques: Verbal cues  Exercises      General Comments        Pertinent Vitals/Pain  Pain Assessment Pain Assessment: Faces Faces Pain Scale: Hurts a little bit Pain Location: back- incisional Pain Descriptors / Indicators: Operative site guarding, Sore Pain Intervention(s): Monitored during session, Repositioned    Home Living Family/patient expects to be discharged to:: Private residence Living Arrangements: Spouse/significant other Available Help at Discharge: Family Type of Home: House Home  Access: Stairs to enter Entrance Stairs-Rails: Doctor, general practice of Steps: 4   Home Layout: One level Home Equipment: Cane - single point;BSC/3in1;Shower seat;Grab bars - tub/shower;Standard Walker;Rollator (4 wheels)      Prior Function            PT Goals (current goals can now be found in the care plan section) Acute Rehab PT Goals Patient Stated Goal: Get well PT Goal Formulation: With patient Time For Goal Achievement: 10/15/23 Progress towards PT goals: Progressing toward goals    Frequency    Min 3X/week       AM-PAC PT 6 Clicks Mobility   Outcome Measure  Help needed turning from your back to your side while in a flat bed without using bedrails?: A Little Help needed moving from lying on your back to sitting on the side of a flat bed without using bedrails?: A Little Help needed moving to and from a bed to a chair (including a wheelchair)?: A Little Help needed standing up from a chair using your arms (e.g., wheelchair or bedside chair)?: A Little Help needed to walk in hospital room?: A Little Help needed climbing 3-5 steps with a railing? : A Little 6 Click Score: 18    End of Session Equipment Utilized During Treatment: Gait belt;Back brace Activity Tolerance: Patient tolerated treatment well Patient left: with call bell/phone within reach;in chair Nurse Communication: Mobility status PT Visit Diagnosis: Unsteadiness on feet (R26.81);Pain;Muscle weakness (generalized) (M62.81);Other abnormalities of gait and mobility (R26.89)     Time: 8651-8591 PT Time Calculation (min) (ACUTE ONLY): 20 min  Charges:    $Gait Training: 8-22 mins PT General Charges $$ ACUTE PT VISIT: 1 Visit                     Darryle George, PTA Acute Rehabilitation Services Secure Chat Preferred  [phone]    Darryle George 10/02/2023, 2:40 PM

## 2023-10-02 NOTE — TOC Progression Note (Signed)
 Transition of Care (TOC) - Progression Note    Patient Details  Name: Corey Adams. MRN: [id number] Date of Birth: May 16, 1945  Transition of Care [facility] CM/SW Contact  Almarie CHRISTELLA Goodie, [place] Phone Number: 10/02/2023, 3:48 PM  Clinical Narrative:   CSW met with patient and spouse at bedside to discuss bed offers for SNF and answer questions. Patient and spouse chose [facility] in [place], as long as patient can have a private room. CSW confirmed private room at [facility], and requested CMA initiate insurance authorization. Awaiting approval. CSW to follow.    Expected Discharge Plan: Skilled Nursing Facility Barriers to Discharge: Continued Medical Work up, English as a second language teacher  Expected Discharge Plan and Services     Post Acute Care Choice: Skilled Nursing Facility Living arrangements for the past 2 months: Single Family Home                                       Social Determinants of Health (SDOH) Interventions SDOH Screenings   Food Insecurity: No Food Insecurity (09/30/2023)  Housing: Low Risk  (09/30/2023)  Transportation Needs: No Transportation Needs (09/30/2023)  Utilities: Not At Risk (09/30/2023)  Social Connections: Moderately Integrated (09/30/2023)  Tobacco Use: Low Risk  (09/29/2023)    Readmission Risk Interventions    07/27/2023   12:29 PM 07/26/2023   12:52 PM  Readmission Risk Prevention Plan  Post Dischage Appt Complete Complete  Medication Screening Complete Complete  Transportation Screening Complete Complete

## 2023-10-02 NOTE — Progress Notes (Signed)
 Subjective: The patient is alert and pleasant.  He feels much better.  Objective: Vital signs in last 24 hours: Temp:  [98.2 F (36.8 C)-102.5 F (39.2 C)] 99.5 F (37.5 C) (07/01 0341) Pulse Rate:  [84-100] 92 (07/01 0341) Resp:  [14-20] 18 (07/01 0341) BP: (113-132)/(61-78) 126/72 (07/01 0341) SpO2:  [95 %-100 %] 95 % (07/01 0341) Estimated body mass index is 28.59 kg/m as calculated from the following:   Height as of this encounter: 5' 8 (1.727 m).   Weight as of this encounter: 85.3 kg.   Intake/Output from previous day: 06/30 0701 - 07/01 0700 In: 680 [P.O.:680] Out: 400 [Urine:400] Intake/Output this shift: No intake/output data recorded.  Physical exam the patient is alert and pleasant.  His strength is normal.  Lab Results: Recent Labs    09/29/23 1520  WBC 9.1  HGB 9.5*  HCT 29.9*  PLT 120*   BMET Recent Labs    09/29/23 1520  NA 137  K 3.4*  CL 102  CO2 25  GLUCOSE 118*  BUN 15  CREATININE 1.30*  CALCIUM  8.8*    Studies/Results: No results found.  Assessment/Plan: Postop day #5: It looks like the patient is over the hump.  We are awaiting skilled nursing facility placement.  LOS: 3 days     Corey Adams 10/02/2023, 7:42 AM     Patient ID: Corey DELENA Blanca Mickey., male   DOB: June 22, 1945, 78 y.o.   MRN: [id number]

## 2023-10-02 NOTE — Care Management Important Message (Signed)
 Important Message  Patient Details  Name: Corey Adams. MRN: [id number] Date of Birth: 05-10-1945   Important Message Given:  Yes - Medicare IM     Claretta Deed 10/02/2023, 3:23 PM

## 2023-10-03 NOTE — TOC Progression Note (Signed)
 Transition of Care (TOC) - Progression Note    Patient Details  Name: Corey Adams. MRN: [id number] Date of Birth: [birth year]  Transition of Care [facility]) CM/SW Contact  Almarie CHRISTELLA Goodie, [place] Phone Number: 10/03/2023, 3:49 PM  Clinical Narrative:   CSW following for discharge to SNF. Authorization remains pending at this time. CSW to follow.    Expected Discharge Plan: Skilled Nursing Facility Barriers to Discharge: Continued Medical Work up, English as a second language teacher  Expected Discharge Plan and Services     Post Acute Care Choice: Skilled Nursing Facility Living arrangements for the past 2 months: Single Family Home                                       Social Determinants of Health (SDOH) Interventions SDOH Screenings   Food Insecurity: No Food Insecurity (09/30/2023)  Housing: Low Risk  (09/30/2023)  Transportation Needs: No Transportation Needs (09/30/2023)  Utilities: Not At Risk (09/30/2023)  Social Connections: Moderately Integrated (09/30/2023)  Tobacco Use: Low Risk  (09/29/2023)    Readmission Risk Interventions    07/27/2023   12:29 PM 07/26/2023   12:52 PM  Readmission Risk Prevention Plan  Post Dischage Appt Complete Complete  Medication Screening Complete Complete  Transportation Screening Complete Complete

## 2023-10-03 NOTE — Progress Notes (Signed)
 Subjective: The patient is alert and pleasant.  He looks well.  He has no complaints.  Objective: Vital signs in last 24 hours: Temp:  [98.2 F (36.8 C)-99.3 F (37.4 C)] 98.3 F (36.8 C) (07/02 0757) Pulse Rate:  [82-91] 88 (07/02 0757) Resp:  [16-18] 16 (07/02 0757) BP: (106-121)/(67-73) 116/67 (07/02 0757) SpO2:  [97 %-100 %] 97 % (07/02 0757) Estimated body mass index is 28.59 kg/m as calculated from the following:   Height as of this encounter: 5' 8 (1.727 m).   Weight as of this encounter: 85.3 kg.   Intake/Output from previous day: No intake/output data recorded. Intake/Output this shift: No intake/output data recorded.  Physical exam the patient is alert and oriented.  His lower extremity strength is normal.  Lab Results: No results for input(s): WBC, HGB, HCT, PLT in the last 72 hours. BMET No results for input(s): NA, K, CL, CO2, GLUCOSE, BUN, CREATININE, CALCIUM  in the last 72 hours.  Studies/Results: No results found.  Assessment/Plan: Postop day #6: The patient is doing well.  We are awaiting skilled nursing facility placement  LOS: 4 days     Corey Adams 10/03/2023, 8:48 AM     Patient ID: Corey Adams., male   DOB: [birth year], 78 y.o.   MRN: [id number]

## 2023-10-03 NOTE — Progress Notes (Signed)
 Physical Therapy Treatment Patient Details Name: Corey Adams. MRN: [id number] DOB: Aug 18, 1945 Today's Date: 10/03/2023   History of Present Illness Pt is a 78 y/o male who presents 09/29/2023 after d/c home from recent L2-L5 PLIF on 09/27/2023. Wife reports fall at home and pt's knees buckling. PMH significant for CKD III, HTN, cog deficit, migraine, pre-diabetes, B carpal tunnel release, distal clavicle excision, L TKA 2021, B TSA.    PT Comments  Pt received in the recliner and agreeable to session. Pt able to tolerate increased gait distance and stair trial this session with CGA for safety. Pt continues to require cues for posture during gait. Pt able to recall back precautions with min cues and don brace with min A. Pt demonstrates improved activity tolerance, but is still below baseline.  Pt continues to benefit from PT services to progress toward functional mobility goals.    If plan is discharge home, recommend the following: A little help with walking and/or transfers;A little help with bathing/dressing/bathroom;Assistance with cooking/housework;Assist for transportation;Help with stairs or ramp for entrance   Can travel by private vehicle     Yes  Equipment Recommendations  Rolling walker (2 wheels)    Recommendations for Other Services       Precautions / Restrictions Precautions Precautions: Fall;Back Recall of Precautions/Restrictions: Impaired Required Braces or Orthoses: Spinal Brace Spinal Brace: Lumbar corset;Applied in sitting position Restrictions Weight Bearing Restrictions Per Provider Order: No     Mobility  Bed Mobility               General bed mobility comments: Pt in recliner at beginning and end of session.    Transfers Overall transfer level: Needs assistance Equipment used: Rolling walker (2 wheels) Transfers: Sit to/from Stand Sit to Stand: Contact guard assist           General transfer comment: cues for hand placement and CGA for  safety    Ambulation/Gait Ambulation/Gait assistance: Contact guard assist Gait Distance (Feet): 225 Feet Assistive device: Rolling walker (2 wheels) Gait Pattern/deviations: Step-through pattern, Decreased stride length, Trunk flexed Gait velocity: Decreased     General Gait Details: cues for upright posture and RW proximity   Stairs Stairs: Yes Stairs assistance: Contact guard assist Stair Management: Step to pattern, Sideways, One rail Left Number of Stairs: 6 General stair comments: slight instability, but no buckling or LOB. CGA for safety   Wheelchair Mobility     Tilt Bed    Modified Rankin (Stroke Patients Only)       Balance Overall balance assessment: Needs assistance Sitting-balance support: No upper extremity supported, Feet supported Sitting balance-Leahy Scale: Good     Standing balance support: Bilateral upper extremity supported, During functional activity Standing balance-Leahy Scale: Fair Standing balance comment: with RW support                            Communication Communication Communication: No apparent difficulties  Cognition Arousal: Alert Behavior During Therapy: WFL for tasks assessed/performed   PT - Cognitive impairments: Memory                         Following commands: Intact      Cueing Cueing Techniques: Verbal cues, Tactile cues  Exercises General Exercises - Lower Extremity Quad Sets: AROM, Seated, Both, 5 reps Long Arc Quad: AROM, Seated, Both, 10 reps    General Comments  Pertinent Vitals/Pain Pain Assessment Pain Assessment: Faces Faces Pain Scale: Hurts a little bit Pain Location: back- incisional Pain Descriptors / Indicators: Sore Pain Intervention(s): Monitored during session, Repositioned     PT Goals (current goals can now be found in the care plan section) Acute Rehab PT Goals Patient Stated Goal: Get well PT Goal Formulation: With patient Time For Goal Achievement:  10/15/23 Progress towards PT goals: Progressing toward goals    Frequency    Min 3X/week       AM-PAC PT 6 Clicks Mobility   Outcome Measure  Help needed turning from your back to your side while in a flat bed without using bedrails?: A Little Help needed moving from lying on your back to sitting on the side of a flat bed without using bedrails?: A Little Help needed moving to and from a bed to a chair (including a wheelchair)?: A Little Help needed standing up from a chair using your arms (e.g., wheelchair or bedside chair)?: A Little Help needed to walk in hospital room?: A Little Help needed climbing 3-5 steps with a railing? : A Little 6 Click Score: 18    End of Session Equipment Utilized During Treatment: Gait belt;Back brace Activity Tolerance: Patient tolerated treatment well Patient left: with call bell/phone within reach;in chair Nurse Communication: Mobility status PT Visit Diagnosis: Unsteadiness on feet (R26.81);Pain;Muscle weakness (generalized) (M62.81);Other abnormalities of gait and mobility (R26.89)     Time: 9157-9098 PT Time Calculation (min) (ACUTE ONLY): 19 min  Charges:    $Gait Training: 8-22 mins PT General Charges $$ ACUTE PT VISIT: 1 Visit                     Darryle George, PTA Acute Rehabilitation Services Secure Chat Preferred  [phone]    Darryle George 10/03/2023, 10:29 AM

## 2023-10-04 MED ORDER — BISACODYL 10 MG RE SUPP
10.0000 mg | Freq: Once | RECTAL | Status: AC
  Administered 2023-10-04: 10 mg via RECTAL
  Filled 2023-10-04: qty 1

## 2023-10-04 MED ORDER — POLYETHYLENE GLYCOL 3350 17 G PO PACK
17.0000 g | PACK | Freq: Two times a day (BID) | ORAL | Status: DC
  Administered 2023-10-04: 17 g via ORAL
  Filled 2023-10-04: qty 1

## 2023-10-04 MED ORDER — POLYETHYLENE GLYCOL 3350 17 G PO PACK: 17.0000 g | PACK | Freq: Two times a day (BID) | ORAL | 0 refills | Status: AC

## 2023-10-04 MED ORDER — FLEET ENEMA RE ENEM: 1.0000 | ENEMA | Freq: Once | RECTAL | Status: DC

## 2023-10-04 MED ORDER — FLEET ENEMA RE ENEM
1.0000 | ENEMA | Freq: Every day | RECTAL | Status: DC | PRN
  Administered 2023-10-04: 1 via RECTAL
  Filled 2023-10-04: qty 1

## 2023-10-04 MED FILL — Sodium Chloride IV Soln 0.9%: INTRAVENOUS | Qty: 1000 | Status: AC

## 2023-10-04 MED FILL — Sodium Chloride Irrigation Soln 0.9%: Qty: 3000 | Status: AC

## 2023-10-04 MED FILL — Heparin Sodium (Porcine) Inj 1000 Unit/ML: INTRAMUSCULAR | Qty: 30 | Status: AC

## 2023-10-04 NOTE — TOC Transition Note (Signed)
 Transition of Care [facility]) - Discharge Note   Patient Details  Name: Corey Adams. MRN: [id number] Date of Birth: [birth year]  Transition of Care [facility]) CM/SW Contact:  Almarie CHRISTELLA Goodie, LCSW Phone Number: 10/04/2023, 12:51 PM   Clinical Narrative:   Patient was denied for SNF, feels well enough to go home today. CSW met with patient and spoke with spouse, Corey Adams, via phone to confirm plan to go home and start home health services. RW ordered through [facility], [facility] updated that patient will discharge home. Information placed on AVS. No additional TOC needs at this time.    Final next level of care: Home w Home Health Services Barriers to Discharge: Barriers Resolved   Patient Goals and CMS Choice Patient states their goals for this hospitalization and ongoing recovery are:: get better CMS Medicare.gov Compare Post Acute Care list provided to:: Patient Choice offered to / list presented to : Patient [place] ownership interest in [facility].provided to:: Patient    Discharge Placement                Patient to be transferred to facility by: Family Name of family member notified: Corey Adams Patient and family notified of of transfer: 10/04/23  Discharge Plan and Services Additional resources added to the After Visit Summary for       Post Acute Care Choice: Skilled Nursing Facility          DME Arranged: Vannie rolling DME Agency: [facility] Date DME Agency Contacted: 10/04/23     HH Arranged: PT, OT HH Agency: [facility] Date HH Agency Contacted: 10/04/23   Representative spoke with at [facility] Agency: [place]  Social Drivers of Health (SDOH) Interventions SDOH Screenings   Food Insecurity: No Food Insecurity (09/30/2023)  Housing: Low Risk  (09/30/2023)  Transportation Needs: No Transportation Needs (09/30/2023)  Utilities: Not At Risk (09/30/2023)  Social Connections: Moderately Integrated (09/30/2023)  Tobacco Use: Low Risk  (09/29/2023)      Readmission Risk Interventions    07/27/2023   12:29 PM 07/26/2023   12:52 PM  Readmission Risk Prevention Plan  Post Dischage Appt Complete Complete  Medication Screening Complete Complete  Transportation Screening Complete Complete

## 2023-10-04 NOTE — Discharge Instructions (Signed)

## 2023-10-04 NOTE — Progress Notes (Signed)
 Denied due to needing a lower level of care, Fast Track Appeal# [phone] Fax No. [phone]

## 2023-10-04 NOTE — Progress Notes (Signed)
   Providing Compassionate, Quality Care - Together   Subjective: Patient reports constipation this morning. Requests something to help him have a bowel movement. He feels he is ready to go home.  Objective: Vital signs in last 24 hours: Temp:  [98 F (36.7 C)-100.1 F (37.8 C)] 98 F (36.7 C) (07/03 0933) Pulse Rate:  [79-92] 92 (07/03 0933) Resp:  [16-19] 19 (07/03 0933) BP: (116-138)/(56-84) 138/73 (07/03 0933) SpO2:  [96 %-100 %] 96 % (07/03 0933)  Intake/Output from previous day: No intake/output data recorded. Intake/Output this shift: No intake/output data recorded.  Alert and oriented x 4 PERRLA CN II-XII grossly intact MAE, Strength and sensation intact Incision is covered clean, dry, and intact   Lab Results: No results for input(s): WBC, HGB, HCT, PLT in the last 72 hours. BMET No results for input(s): NA, K, CL, CO2, GLUCOSE, BUN, CREATININE, CALCIUM  in the last 72 hours.  Studies/Results: No results found.  Assessment/Plan: Patient underwent L2-3, L3-4, L4-5 TLIF by Dr. Mavis on 09/27/2023. He was discharged on 09/29/2023, but returned to the ED on 09/30/2023 due to weakness of BLE.   LOS: 5 days   -Miralax  and Dulcolax suppository added. Enema prn. -May d/c home later today with [facility] if therapies feel appropriate.  I am in communication with my attending and they agree with the plan for this patient.   Gerard Beck, DNP, AGNP-C Nurse Practitioner  [facility] [address], [place], [place] [address] P: [phone]    F: [phone]  10/04/2023, 9:41 AM

## 2023-10-04 NOTE — Plan of Care (Signed)

## 2023-10-04 NOTE — Discharge Summary (Signed)
 Physician Discharge Summary     Providing Compassionate, Quality Care - Together   Patient ID: Elsie DELENA Blanca Mickey. MRN: [id number] DOB/AGE: [birth year] 78 y.o.  Admit date: 09/29/2023 Discharge date: 10/04/2023  Admission Diagnoses: Post op weakness  Discharge Diagnoses:  Principal Problem:   Fever Active Problems:   SIRS (systemic inflammatory response syndrome) [facility])   Discharged Condition: good  Hospital Course:Patient underwent L2-3, L3-4, L4-5 TLIF by Dr. Mavis on 09/27/2023. He was discharged on 09/29/2023, but returned to the ED on 09/30/2023 due to weakness of BLE. He has worked with both physical and occupational therapies who feel the patient is ready for discharge home with Home Health therapies. He is ambulating with the aid of a walker. He is tolerating a normal diet. He is not having any bladder dysfunction. He has had some intermittent  constipation since surgery. His pain is well-controlled with oral pain medication. He is ready for discharge with Home Health therapies.   Consults: PT/OT/TOC   Treatments: Observation, PT, OT, Pain Management  Discharge Exam: Blood pressure 138/73, pulse 92, temperature 98 F (36.7 C), temperature source Oral, resp. rate 19, height 5' 8 (1.727 m), weight 85.3 kg, SpO2 96%.  Alert and oriented x 4 PERRLA CN II-XII grossly intact MAE, Strength and sensation intact Incision is covered clean, dry, and intact  Disposition: Discharge disposition: 06-Home-Health Care Svc       Discharge Instructions     Call MD for:  difficulty breathing, headache or visual disturbances   Complete by: As directed    Call MD for:  hives   Complete by: As directed    Call MD for:  persistant nausea and vomiting   Complete by: As directed    Call MD for:  redness, tenderness, or signs of infection (pain, swelling, redness, odor or green/yellow discharge around incision site)   Complete by: As directed    Call MD for:  severe uncontrolled  pain   Complete by: As directed    Diet - low sodium heart healthy   Complete by: As directed    Increase activity slowly   Complete by: As directed    No dressing needed   Complete by: As directed       Allergies as of 10/04/2023       Reactions   Shellfish Allergy Anaphylaxis   Guar Gum Hives   Xanthan Gum Hives   Codeine Nausea And Vomiting        Medication List     TAKE these medications    acetaminophen  500 MG tablet Commonly known as: TYLENOL  Take 1,000 mg by mouth every 6 (six) hours as needed.   atorvastatin  10 MG tablet Commonly known as: LIPITOR Take 10 mg by mouth daily.   bisoprolol -hydrochlorothiazide  2.5-6.25 MG tablet Commonly known as: ZIAC  Take 1 tablet by mouth daily.   cyclobenzaprine  10 MG tablet Commonly known as: FLEXERIL  Take 1 tablet (10 mg total) by mouth at bedtime as needed for muscle spasms.   escitalopram  10 MG tablet Commonly known as: LEXAPRO  Take 10 mg by mouth daily.   loratadine  10 MG tablet Commonly known as: CLARITIN  Take 20 mg by mouth at bedtime.   omeprazole 40 MG capsule Commonly known as: PRILOSEC Take 40 mg by mouth daily.   Oxycodone  HCl 10 MG Tabs Take 10 mg by mouth 4 (four) times daily as needed.   polyethylene glycol 17 g packet Commonly known as: MIRALAX  / GLYCOLAX  Take 17 g by mouth 2 (two) times  daily.               Durable Medical Equipment  (From admission, onward)           Start     Ordered   10/04/23 1132  For home use only DME Walker rolling  Once       Question Answer Comment  Walker: With 5 Inch Wheels   Patient needs a walker to treat with the following condition Weakness      10/04/23 1132              Discharge Care Instructions  (From admission, onward)           Start     Ordered   10/04/23 0000  No dressing needed        10/04/23 1346            Contact information for follow-up providers     Mavis Purchase, MD. Go on 10/23/2023.   Specialty:  Neurosurgery Why: First post op appointment with x-rays is on 10/23/2023 at 2:00 PM. Contact information: [address] [place] [address] [phone]         [facility] Follow up.   Specialty: Home Health Services Why: Representative from [facility] will contact you to schedule start of home health services. Contact information: [address] [place] [address] [phone]                 Signed: Gerard Beck, DNP, AGNP-C Nurse Practitioner  [facility] [address], [place], [place] [address] P: [phone]    F: [phone]  10/04/2023, 1:47 PM

## 2023-10-05 LAB — CULTURE, BLOOD (ROUTINE X 2)
Culture: NO GROWTH
Culture: NO GROWTH
Special Requests: ADEQUATE

## 2023-10-08 DIAGNOSIS — Z4782 Encounter for orthopedic aftercare following scoliosis surgery: Secondary | ICD-10-CM | POA: Diagnosis not present

## 2023-10-08 DIAGNOSIS — R6510 Systemic inflammatory response syndrome (SIRS) of non-infectious origin without acute organ dysfunction: Secondary | ICD-10-CM | POA: Diagnosis not present

## 2023-10-08 DIAGNOSIS — N183 Chronic kidney disease, stage 3 unspecified: Secondary | ICD-10-CM | POA: Diagnosis not present

## 2023-10-08 DIAGNOSIS — G250 Essential tremor: Secondary | ICD-10-CM | POA: Diagnosis not present

## 2023-10-08 DIAGNOSIS — M4156 Other secondary scoliosis, lumbar region: Secondary | ICD-10-CM | POA: Diagnosis not present

## 2023-10-08 DIAGNOSIS — M4316 Spondylolisthesis, lumbar region: Secondary | ICD-10-CM | POA: Diagnosis not present

## 2023-10-08 DIAGNOSIS — I129 Hypertensive chronic kidney disease with stage 1 through stage 4 chronic kidney disease, or unspecified chronic kidney disease: Secondary | ICD-10-CM | POA: Diagnosis not present

## 2023-10-08 DIAGNOSIS — M4726 Other spondylosis with radiculopathy, lumbar region: Secondary | ICD-10-CM | POA: Diagnosis not present

## 2023-10-08 DIAGNOSIS — J45909 Unspecified asthma, uncomplicated: Secondary | ICD-10-CM | POA: Diagnosis not present

## 2023-10-12 DIAGNOSIS — R6510 Systemic inflammatory response syndrome (SIRS) of non-infectious origin without acute organ dysfunction: Secondary | ICD-10-CM | POA: Diagnosis not present

## 2023-10-12 DIAGNOSIS — N183 Chronic kidney disease, stage 3 unspecified: Secondary | ICD-10-CM | POA: Diagnosis not present

## 2023-10-12 DIAGNOSIS — M4156 Other secondary scoliosis, lumbar region: Secondary | ICD-10-CM | POA: Diagnosis not present

## 2023-10-12 DIAGNOSIS — G250 Essential tremor: Secondary | ICD-10-CM | POA: Diagnosis not present

## 2023-10-12 DIAGNOSIS — J45909 Unspecified asthma, uncomplicated: Secondary | ICD-10-CM | POA: Diagnosis not present

## 2023-10-12 DIAGNOSIS — I129 Hypertensive chronic kidney disease with stage 1 through stage 4 chronic kidney disease, or unspecified chronic kidney disease: Secondary | ICD-10-CM | POA: Diagnosis not present

## 2023-10-12 DIAGNOSIS — M4726 Other spondylosis with radiculopathy, lumbar region: Secondary | ICD-10-CM | POA: Diagnosis not present

## 2023-10-12 DIAGNOSIS — Z4782 Encounter for orthopedic aftercare following scoliosis surgery: Secondary | ICD-10-CM | POA: Diagnosis not present

## 2023-10-12 DIAGNOSIS — M4316 Spondylolisthesis, lumbar region: Secondary | ICD-10-CM | POA: Diagnosis not present

## 2023-10-15 DIAGNOSIS — M4156 Other secondary scoliosis, lumbar region: Secondary | ICD-10-CM | POA: Diagnosis not present

## 2023-10-15 DIAGNOSIS — M4726 Other spondylosis with radiculopathy, lumbar region: Secondary | ICD-10-CM | POA: Diagnosis not present

## 2023-10-15 DIAGNOSIS — D649 Anemia, unspecified: Secondary | ICD-10-CM | POA: Diagnosis not present

## 2023-10-15 DIAGNOSIS — N183 Chronic kidney disease, stage 3 unspecified: Secondary | ICD-10-CM | POA: Diagnosis not present

## 2023-10-15 DIAGNOSIS — N189 Chronic kidney disease, unspecified: Secondary | ICD-10-CM | POA: Diagnosis not present

## 2023-10-15 DIAGNOSIS — Z4782 Encounter for orthopedic aftercare following scoliosis surgery: Secondary | ICD-10-CM | POA: Diagnosis not present

## 2023-10-15 DIAGNOSIS — R6510 Systemic inflammatory response syndrome (SIRS) of non-infectious origin without acute organ dysfunction: Secondary | ICD-10-CM | POA: Diagnosis not present

## 2023-10-15 DIAGNOSIS — J45909 Unspecified asthma, uncomplicated: Secondary | ICD-10-CM | POA: Diagnosis not present

## 2023-10-15 DIAGNOSIS — I129 Hypertensive chronic kidney disease with stage 1 through stage 4 chronic kidney disease, or unspecified chronic kidney disease: Secondary | ICD-10-CM | POA: Diagnosis not present

## 2023-10-15 DIAGNOSIS — Z09 Encounter for follow-up examination after completed treatment for conditions other than malignant neoplasm: Secondary | ICD-10-CM | POA: Diagnosis not present

## 2023-10-15 DIAGNOSIS — R350 Frequency of micturition: Secondary | ICD-10-CM | POA: Diagnosis not present

## 2023-10-15 DIAGNOSIS — G250 Essential tremor: Secondary | ICD-10-CM | POA: Diagnosis not present

## 2023-10-15 DIAGNOSIS — M4316 Spondylolisthesis, lumbar region: Secondary | ICD-10-CM | POA: Diagnosis not present

## 2023-10-17 DIAGNOSIS — N183 Chronic kidney disease, stage 3 unspecified: Secondary | ICD-10-CM | POA: Diagnosis not present

## 2023-10-17 DIAGNOSIS — G250 Essential tremor: Secondary | ICD-10-CM | POA: Diagnosis not present

## 2023-10-17 DIAGNOSIS — M4726 Other spondylosis with radiculopathy, lumbar region: Secondary | ICD-10-CM | POA: Diagnosis not present

## 2023-10-17 DIAGNOSIS — I129 Hypertensive chronic kidney disease with stage 1 through stage 4 chronic kidney disease, or unspecified chronic kidney disease: Secondary | ICD-10-CM | POA: Diagnosis not present

## 2023-10-17 DIAGNOSIS — M4316 Spondylolisthesis, lumbar region: Secondary | ICD-10-CM | POA: Diagnosis not present

## 2023-10-17 DIAGNOSIS — Z4782 Encounter for orthopedic aftercare following scoliosis surgery: Secondary | ICD-10-CM | POA: Diagnosis not present

## 2023-10-17 DIAGNOSIS — M4156 Other secondary scoliosis, lumbar region: Secondary | ICD-10-CM | POA: Diagnosis not present

## 2023-10-17 DIAGNOSIS — J45909 Unspecified asthma, uncomplicated: Secondary | ICD-10-CM | POA: Diagnosis not present

## 2023-10-17 DIAGNOSIS — R6510 Systemic inflammatory response syndrome (SIRS) of non-infectious origin without acute organ dysfunction: Secondary | ICD-10-CM | POA: Diagnosis not present

## 2023-10-22 DIAGNOSIS — Z4782 Encounter for orthopedic aftercare following scoliosis surgery: Secondary | ICD-10-CM | POA: Diagnosis not present

## 2023-10-22 DIAGNOSIS — R6510 Systemic inflammatory response syndrome (SIRS) of non-infectious origin without acute organ dysfunction: Secondary | ICD-10-CM | POA: Diagnosis not present

## 2023-10-22 DIAGNOSIS — J45909 Unspecified asthma, uncomplicated: Secondary | ICD-10-CM | POA: Diagnosis not present

## 2023-10-22 DIAGNOSIS — I129 Hypertensive chronic kidney disease with stage 1 through stage 4 chronic kidney disease, or unspecified chronic kidney disease: Secondary | ICD-10-CM | POA: Diagnosis not present

## 2023-10-22 DIAGNOSIS — M4316 Spondylolisthesis, lumbar region: Secondary | ICD-10-CM | POA: Diagnosis not present

## 2023-10-22 DIAGNOSIS — G250 Essential tremor: Secondary | ICD-10-CM | POA: Diagnosis not present

## 2023-10-22 DIAGNOSIS — M4156 Other secondary scoliosis, lumbar region: Secondary | ICD-10-CM | POA: Diagnosis not present

## 2023-10-22 DIAGNOSIS — M4726 Other spondylosis with radiculopathy, lumbar region: Secondary | ICD-10-CM | POA: Diagnosis not present

## 2023-10-22 DIAGNOSIS — N183 Chronic kidney disease, stage 3 unspecified: Secondary | ICD-10-CM | POA: Diagnosis not present

## 2023-10-23 DIAGNOSIS — M4316 Spondylolisthesis, lumbar region: Secondary | ICD-10-CM | POA: Diagnosis not present

## 2023-10-24 DIAGNOSIS — M4316 Spondylolisthesis, lumbar region: Secondary | ICD-10-CM | POA: Diagnosis not present

## 2023-10-24 DIAGNOSIS — R6510 Systemic inflammatory response syndrome (SIRS) of non-infectious origin without acute organ dysfunction: Secondary | ICD-10-CM | POA: Diagnosis not present

## 2023-10-24 DIAGNOSIS — J45909 Unspecified asthma, uncomplicated: Secondary | ICD-10-CM | POA: Diagnosis not present

## 2023-10-24 DIAGNOSIS — G250 Essential tremor: Secondary | ICD-10-CM | POA: Diagnosis not present

## 2023-10-24 DIAGNOSIS — M4156 Other secondary scoliosis, lumbar region: Secondary | ICD-10-CM | POA: Diagnosis not present

## 2023-10-24 DIAGNOSIS — Z4782 Encounter for orthopedic aftercare following scoliosis surgery: Secondary | ICD-10-CM | POA: Diagnosis not present

## 2023-10-24 DIAGNOSIS — N183 Chronic kidney disease, stage 3 unspecified: Secondary | ICD-10-CM | POA: Diagnosis not present

## 2023-10-24 DIAGNOSIS — I129 Hypertensive chronic kidney disease with stage 1 through stage 4 chronic kidney disease, or unspecified chronic kidney disease: Secondary | ICD-10-CM | POA: Diagnosis not present

## 2023-10-24 DIAGNOSIS — M4726 Other spondylosis with radiculopathy, lumbar region: Secondary | ICD-10-CM | POA: Diagnosis not present

## 2023-11-02 DIAGNOSIS — M4726 Other spondylosis with radiculopathy, lumbar region: Secondary | ICD-10-CM | POA: Diagnosis not present

## 2023-11-02 DIAGNOSIS — M4316 Spondylolisthesis, lumbar region: Secondary | ICD-10-CM | POA: Diagnosis not present

## 2023-11-02 DIAGNOSIS — G250 Essential tremor: Secondary | ICD-10-CM | POA: Diagnosis not present

## 2023-11-02 DIAGNOSIS — R6510 Systemic inflammatory response syndrome (SIRS) of non-infectious origin without acute organ dysfunction: Secondary | ICD-10-CM | POA: Diagnosis not present

## 2023-11-02 DIAGNOSIS — Z4782 Encounter for orthopedic aftercare following scoliosis surgery: Secondary | ICD-10-CM | POA: Diagnosis not present

## 2023-11-02 DIAGNOSIS — N183 Chronic kidney disease, stage 3 unspecified: Secondary | ICD-10-CM | POA: Diagnosis not present

## 2023-11-02 DIAGNOSIS — M4156 Other secondary scoliosis, lumbar region: Secondary | ICD-10-CM | POA: Diagnosis not present

## 2023-11-02 DIAGNOSIS — J45909 Unspecified asthma, uncomplicated: Secondary | ICD-10-CM | POA: Diagnosis not present

## 2023-11-02 DIAGNOSIS — I129 Hypertensive chronic kidney disease with stage 1 through stage 4 chronic kidney disease, or unspecified chronic kidney disease: Secondary | ICD-10-CM | POA: Diagnosis not present

## 2023-11-03 DIAGNOSIS — J45909 Unspecified asthma, uncomplicated: Secondary | ICD-10-CM | POA: Diagnosis not present

## 2023-11-03 DIAGNOSIS — I129 Hypertensive chronic kidney disease with stage 1 through stage 4 chronic kidney disease, or unspecified chronic kidney disease: Secondary | ICD-10-CM | POA: Diagnosis not present

## 2023-11-03 DIAGNOSIS — Z4782 Encounter for orthopedic aftercare following scoliosis surgery: Secondary | ICD-10-CM | POA: Diagnosis not present

## 2023-11-03 DIAGNOSIS — G250 Essential tremor: Secondary | ICD-10-CM | POA: Diagnosis not present

## 2023-11-03 DIAGNOSIS — M4156 Other secondary scoliosis, lumbar region: Secondary | ICD-10-CM | POA: Diagnosis not present

## 2023-11-03 DIAGNOSIS — M4316 Spondylolisthesis, lumbar region: Secondary | ICD-10-CM | POA: Diagnosis not present

## 2023-11-03 DIAGNOSIS — M4726 Other spondylosis with radiculopathy, lumbar region: Secondary | ICD-10-CM | POA: Diagnosis not present

## 2023-11-03 DIAGNOSIS — N183 Chronic kidney disease, stage 3 unspecified: Secondary | ICD-10-CM | POA: Diagnosis not present

## 2023-11-03 DIAGNOSIS — R6510 Systemic inflammatory response syndrome (SIRS) of non-infectious origin without acute organ dysfunction: Secondary | ICD-10-CM | POA: Diagnosis not present

## 2023-11-07 DIAGNOSIS — I129 Hypertensive chronic kidney disease with stage 1 through stage 4 chronic kidney disease, or unspecified chronic kidney disease: Secondary | ICD-10-CM | POA: Diagnosis not present

## 2023-11-07 DIAGNOSIS — N183 Chronic kidney disease, stage 3 unspecified: Secondary | ICD-10-CM | POA: Diagnosis not present

## 2023-11-07 DIAGNOSIS — Z4782 Encounter for orthopedic aftercare following scoliosis surgery: Secondary | ICD-10-CM | POA: Diagnosis not present

## 2023-11-07 DIAGNOSIS — G250 Essential tremor: Secondary | ICD-10-CM | POA: Diagnosis not present

## 2023-11-07 DIAGNOSIS — R6510 Systemic inflammatory response syndrome (SIRS) of non-infectious origin without acute organ dysfunction: Secondary | ICD-10-CM | POA: Diagnosis not present

## 2023-11-07 DIAGNOSIS — M4156 Other secondary scoliosis, lumbar region: Secondary | ICD-10-CM | POA: Diagnosis not present

## 2023-11-07 DIAGNOSIS — J45909 Unspecified asthma, uncomplicated: Secondary | ICD-10-CM | POA: Diagnosis not present

## 2023-11-07 DIAGNOSIS — M4726 Other spondylosis with radiculopathy, lumbar region: Secondary | ICD-10-CM | POA: Diagnosis not present

## 2023-11-07 DIAGNOSIS — M4316 Spondylolisthesis, lumbar region: Secondary | ICD-10-CM | POA: Diagnosis not present

## 2023-11-21 DIAGNOSIS — Z4782 Encounter for orthopedic aftercare following scoliosis surgery: Secondary | ICD-10-CM | POA: Diagnosis not present

## 2023-11-21 DIAGNOSIS — M4316 Spondylolisthesis, lumbar region: Secondary | ICD-10-CM | POA: Diagnosis not present

## 2023-11-21 DIAGNOSIS — M4156 Other secondary scoliosis, lumbar region: Secondary | ICD-10-CM | POA: Diagnosis not present

## 2023-11-21 DIAGNOSIS — R6510 Systemic inflammatory response syndrome (SIRS) of non-infectious origin without acute organ dysfunction: Secondary | ICD-10-CM | POA: Diagnosis not present

## 2023-11-21 DIAGNOSIS — J45909 Unspecified asthma, uncomplicated: Secondary | ICD-10-CM | POA: Diagnosis not present

## 2023-11-21 DIAGNOSIS — N183 Chronic kidney disease, stage 3 unspecified: Secondary | ICD-10-CM | POA: Diagnosis not present

## 2023-11-21 DIAGNOSIS — M4726 Other spondylosis with radiculopathy, lumbar region: Secondary | ICD-10-CM | POA: Diagnosis not present

## 2023-11-21 DIAGNOSIS — G250 Essential tremor: Secondary | ICD-10-CM | POA: Diagnosis not present

## 2023-11-21 DIAGNOSIS — I129 Hypertensive chronic kidney disease with stage 1 through stage 4 chronic kidney disease, or unspecified chronic kidney disease: Secondary | ICD-10-CM | POA: Diagnosis not present

## 2023-11-28 DIAGNOSIS — M4156 Other secondary scoliosis, lumbar region: Secondary | ICD-10-CM | POA: Diagnosis not present

## 2023-11-28 DIAGNOSIS — G250 Essential tremor: Secondary | ICD-10-CM | POA: Diagnosis not present

## 2023-11-28 DIAGNOSIS — Z4782 Encounter for orthopedic aftercare following scoliosis surgery: Secondary | ICD-10-CM | POA: Diagnosis not present

## 2023-11-28 DIAGNOSIS — R6510 Systemic inflammatory response syndrome (SIRS) of non-infectious origin without acute organ dysfunction: Secondary | ICD-10-CM | POA: Diagnosis not present

## 2023-11-28 DIAGNOSIS — M4726 Other spondylosis with radiculopathy, lumbar region: Secondary | ICD-10-CM | POA: Diagnosis not present

## 2023-11-28 DIAGNOSIS — N183 Chronic kidney disease, stage 3 unspecified: Secondary | ICD-10-CM | POA: Diagnosis not present

## 2023-11-28 DIAGNOSIS — M4316 Spondylolisthesis, lumbar region: Secondary | ICD-10-CM | POA: Diagnosis not present

## 2023-11-28 DIAGNOSIS — I129 Hypertensive chronic kidney disease with stage 1 through stage 4 chronic kidney disease, or unspecified chronic kidney disease: Secondary | ICD-10-CM | POA: Diagnosis not present

## 2023-11-28 DIAGNOSIS — J45909 Unspecified asthma, uncomplicated: Secondary | ICD-10-CM | POA: Diagnosis not present

## 2023-11-29 DIAGNOSIS — H3589 Other specified retinal disorders: Secondary | ICD-10-CM | POA: Diagnosis not present

## 2023-11-29 DIAGNOSIS — H43812 Vitreous degeneration, left eye: Secondary | ICD-10-CM | POA: Diagnosis not present

## 2023-12-04 DIAGNOSIS — D2339 Other benign neoplasm of skin of other parts of face: Secondary | ICD-10-CM | POA: Diagnosis not present

## 2023-12-04 DIAGNOSIS — X32XXXA Exposure to sunlight, initial encounter: Secondary | ICD-10-CM | POA: Diagnosis not present

## 2023-12-04 DIAGNOSIS — B078 Other viral warts: Secondary | ICD-10-CM | POA: Diagnosis not present

## 2023-12-04 DIAGNOSIS — L820 Inflamed seborrheic keratosis: Secondary | ICD-10-CM | POA: Diagnosis not present

## 2023-12-04 DIAGNOSIS — L570 Actinic keratosis: Secondary | ICD-10-CM | POA: Diagnosis not present

## 2024-01-22 DIAGNOSIS — M7061 Trochanteric bursitis, right hip: Secondary | ICD-10-CM | POA: Diagnosis not present

## 2024-01-22 DIAGNOSIS — M4316 Spondylolisthesis, lumbar region: Secondary | ICD-10-CM | POA: Diagnosis not present

## 2024-01-22 DIAGNOSIS — M65321 Trigger finger, right index finger: Secondary | ICD-10-CM | POA: Diagnosis not present

## 2024-01-30 DIAGNOSIS — H43812 Vitreous degeneration, left eye: Secondary | ICD-10-CM | POA: Diagnosis not present

## 2024-01-30 DIAGNOSIS — H524 Presbyopia: Secondary | ICD-10-CM | POA: Diagnosis not present

## 2024-02-05 DIAGNOSIS — R2689 Other abnormalities of gait and mobility: Secondary | ICD-10-CM | POA: Diagnosis not present

## 2024-02-05 DIAGNOSIS — M6281 Muscle weakness (generalized): Secondary | ICD-10-CM | POA: Diagnosis not present

## 2024-02-05 DIAGNOSIS — M545 Low back pain, unspecified: Secondary | ICD-10-CM | POA: Diagnosis not present

## 2024-02-08 DIAGNOSIS — M6281 Muscle weakness (generalized): Secondary | ICD-10-CM | POA: Diagnosis not present

## 2024-02-08 DIAGNOSIS — R2689 Other abnormalities of gait and mobility: Secondary | ICD-10-CM | POA: Diagnosis not present

## 2024-02-08 DIAGNOSIS — M545 Low back pain, unspecified: Secondary | ICD-10-CM | POA: Diagnosis not present

## 2024-02-11 DIAGNOSIS — M6281 Muscle weakness (generalized): Secondary | ICD-10-CM | POA: Diagnosis not present

## 2024-02-11 DIAGNOSIS — R2689 Other abnormalities of gait and mobility: Secondary | ICD-10-CM | POA: Diagnosis not present

## 2024-02-11 DIAGNOSIS — M545 Low back pain, unspecified: Secondary | ICD-10-CM | POA: Diagnosis not present

## 2024-02-13 DIAGNOSIS — M545 Low back pain, unspecified: Secondary | ICD-10-CM | POA: Diagnosis not present

## 2024-02-13 DIAGNOSIS — R2689 Other abnormalities of gait and mobility: Secondary | ICD-10-CM | POA: Diagnosis not present

## 2024-02-13 DIAGNOSIS — M6281 Muscle weakness (generalized): Secondary | ICD-10-CM | POA: Diagnosis not present

## 2024-02-14 DIAGNOSIS — M79644 Pain in right finger(s): Secondary | ICD-10-CM | POA: Diagnosis not present

## 2024-02-14 DIAGNOSIS — M65321 Trigger finger, right index finger: Secondary | ICD-10-CM | POA: Diagnosis not present

## 2024-02-14 DIAGNOSIS — M65331 Trigger finger, right middle finger: Secondary | ICD-10-CM | POA: Diagnosis not present

## 2024-02-18 DIAGNOSIS — M25561 Pain in right knee: Secondary | ICD-10-CM | POA: Diagnosis not present

## 2024-02-19 DIAGNOSIS — R2689 Other abnormalities of gait and mobility: Secondary | ICD-10-CM | POA: Diagnosis not present

## 2024-02-19 DIAGNOSIS — M545 Low back pain, unspecified: Secondary | ICD-10-CM | POA: Diagnosis not present

## 2024-02-19 DIAGNOSIS — M6281 Muscle weakness (generalized): Secondary | ICD-10-CM | POA: Diagnosis not present

## 2024-02-21 DIAGNOSIS — M545 Low back pain, unspecified: Secondary | ICD-10-CM | POA: Diagnosis not present

## 2024-02-21 DIAGNOSIS — R2689 Other abnormalities of gait and mobility: Secondary | ICD-10-CM | POA: Diagnosis not present

## 2024-02-21 DIAGNOSIS — M6281 Muscle weakness (generalized): Secondary | ICD-10-CM | POA: Diagnosis not present

## 2024-02-27 DIAGNOSIS — M6281 Muscle weakness (generalized): Secondary | ICD-10-CM | POA: Diagnosis not present

## 2024-02-27 DIAGNOSIS — R2689 Other abnormalities of gait and mobility: Secondary | ICD-10-CM | POA: Diagnosis not present

## 2024-02-27 DIAGNOSIS — M545 Low back pain, unspecified: Secondary | ICD-10-CM | POA: Diagnosis not present
